# Patient Record
Sex: Female | Born: 1985 | Race: White | Hispanic: No | Marital: Single | Smoking: Never smoker
Health system: Southern US, Community
[De-identification: ages and names within clinical notes are randomized; demographics above are authoritative.]

## PROBLEM LIST (undated history)

## (undated) DIAGNOSIS — F32A Depression, unspecified: Secondary | ICD-10-CM

## (undated) DIAGNOSIS — E282 Polycystic ovarian syndrome: Secondary | ICD-10-CM

## (undated) DIAGNOSIS — F329 Major depressive disorder, single episode, unspecified: Secondary | ICD-10-CM

## (undated) DIAGNOSIS — G43909 Migraine, unspecified, not intractable, without status migrainosus: Secondary | ICD-10-CM

## (undated) DIAGNOSIS — M797 Fibromyalgia: Secondary | ICD-10-CM

## (undated) DIAGNOSIS — D649 Anemia, unspecified: Secondary | ICD-10-CM

## (undated) DIAGNOSIS — T7840XA Allergy, unspecified, initial encounter: Secondary | ICD-10-CM

## (undated) DIAGNOSIS — K5090 Crohn's disease, unspecified, without complications: Secondary | ICD-10-CM

## (undated) DIAGNOSIS — L4050 Arthropathic psoriasis, unspecified: Secondary | ICD-10-CM

## (undated) HISTORY — DX: Depression, unspecified: F32.A

## (undated) HISTORY — DX: Allergy, unspecified, initial encounter: T78.40XA

## (undated) HISTORY — DX: Major depressive disorder, single episode, unspecified: F32.9

## (undated) HISTORY — DX: Anemia, unspecified: D64.9

## (undated) HISTORY — DX: Crohn's disease, unspecified, without complications: K50.90

## (undated) HISTORY — DX: Migraine, unspecified, not intractable, without status migrainosus: G43.909

## (undated) HISTORY — PX: COLONOSCOPY W/ BIOPSIES: SHX1374

## (undated) HISTORY — DX: Polycystic ovarian syndrome: E28.2

---
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
## 2007-12-01 ENCOUNTER — Ambulatory Visit: Payer: Self-pay | Admitting: Family Medicine

## 2009-04-02 ENCOUNTER — Encounter: Admission: RE | Admit: 2009-04-02 | Discharge: 2009-04-02 | Payer: Self-pay | Admitting: Otolaryngology

## 2010-12-27 DIAGNOSIS — E282 Polycystic ovarian syndrome: Secondary | ICD-10-CM

## 2010-12-27 HISTORY — DX: Polycystic ovarian syndrome: E28.2

## 2011-02-04 ENCOUNTER — Other Ambulatory Visit (HOSPITAL_COMMUNITY): Payer: Self-pay | Admitting: Surgery

## 2011-02-09 ENCOUNTER — Ambulatory Visit (HOSPITAL_COMMUNITY)
Admission: RE | Admit: 2011-02-09 | Discharge: 2011-02-09 | Disposition: A | Discharge status: Home / Self Care | Payer: BC Managed Care – PPO | Source: Ambulatory Visit | Attending: Surgery | Admitting: Surgery

## 2011-02-09 DIAGNOSIS — Z01818 Encounter for other preprocedural examination: Secondary | ICD-10-CM | POA: Insufficient documentation

## 2011-02-12 ENCOUNTER — Ambulatory Visit (HOSPITAL_COMMUNITY)
Admission: RE | Admit: 2011-02-12 | Discharge: 2011-02-12 | Disposition: A | Discharge status: Home / Self Care | Payer: BC Managed Care – PPO | Source: Ambulatory Visit | Attending: Surgery | Admitting: Surgery

## 2011-02-12 DIAGNOSIS — Z01818 Encounter for other preprocedural examination: Secondary | ICD-10-CM | POA: Insufficient documentation

## 2011-02-12 DIAGNOSIS — Z6841 Body Mass Index (BMI) 40.0 and over, adult: Secondary | ICD-10-CM | POA: Insufficient documentation

## 2011-02-16 ENCOUNTER — Encounter: Payer: BC Managed Care – PPO | Attending: Surgery | Admitting: *Deleted

## 2011-02-16 DIAGNOSIS — Z713 Dietary counseling and surveillance: Secondary | ICD-10-CM | POA: Insufficient documentation

## 2011-02-16 DIAGNOSIS — Z01818 Encounter for other preprocedural examination: Secondary | ICD-10-CM | POA: Insufficient documentation

## 2011-03-18 ENCOUNTER — Encounter: Payer: BC Managed Care – PPO | Attending: Surgery

## 2011-03-18 DIAGNOSIS — Z01818 Encounter for other preprocedural examination: Secondary | ICD-10-CM | POA: Insufficient documentation

## 2011-03-18 DIAGNOSIS — Z713 Dietary counseling and surveillance: Secondary | ICD-10-CM | POA: Insufficient documentation

## 2011-03-31 ENCOUNTER — Other Ambulatory Visit: Payer: Self-pay | Admitting: Surgery

## 2011-03-31 ENCOUNTER — Encounter (HOSPITAL_COMMUNITY): Payer: BC Managed Care – PPO

## 2011-03-31 LAB — DIFFERENTIAL
Basophils Absolute: 0 10*3/uL (ref 0.0–0.1)
Basophils Relative: 1 % (ref 0–1)
Eosinophils Absolute: 0.2 10*3/uL (ref 0.0–0.7)
Eosinophils Relative: 2 % (ref 0–5)
Lymphocytes Relative: 25 % (ref 12–46)
Lymphs Abs: 2.2 10*3/uL (ref 0.7–4.0)
Monocytes Absolute: 0.7 10*3/uL (ref 0.1–1.0)
Monocytes Relative: 8 % (ref 3–12)
Neutro Abs: 5.6 10*3/uL (ref 1.7–7.7)
Neutrophils Relative %: 65 % (ref 43–77)

## 2011-03-31 LAB — CBC
HCT: 38.4 % (ref 36.0–46.0)
Hemoglobin: 12.5 g/dL (ref 12.0–15.0)
MCH: 27.3 pg (ref 26.0–34.0)
MCHC: 32.6 g/dL (ref 30.0–36.0)
MCV: 83.8 fL (ref 78.0–100.0)
Platelets: 416 10*3/uL — ABNORMAL HIGH (ref 150–400)
RBC: 4.58 MIL/uL (ref 3.87–5.11)
RDW: 13.3 % (ref 11.5–15.5)
WBC: 8.7 10*3/uL (ref 4.0–10.5)

## 2011-03-31 LAB — COMPREHENSIVE METABOLIC PANEL
ALT: 35 U/L (ref 0–35)
AST: 25 U/L (ref 0–37)
Albumin: 3.9 g/dL (ref 3.5–5.2)
Alkaline Phosphatase: 64 U/L (ref 39–117)
BUN: 15 mg/dL (ref 6–23)
CO2: 25 mEq/L (ref 19–32)
Calcium: 9.5 mg/dL (ref 8.4–10.5)
Chloride: 101 mEq/L (ref 96–112)
Creatinine, Ser: 0.68 mg/dL (ref 0.4–1.2)
GFR calc Af Amer: >60 mL/min (ref >60)
GFR calc non Af Amer: >60 mL/min (ref >60)
Glucose, Bld: 83 mg/dL (ref 70–99)
Potassium: 3.8 mEq/L (ref 3.5–5.1)
Sodium: 136 mEq/L (ref 135–145)
Total Bilirubin: 0.6 mg/dL (ref 0.3–1.2)
Total Protein: 8 g/dL (ref 6.0–8.3)

## 2011-03-31 LAB — HCG, SERUM, QUALITATIVE: Preg, Serum: NEGATIVE

## 2011-03-31 LAB — SURGICAL PCR SCREEN
MRSA, PCR: NEGATIVE
Staphylococcus aureus: NEGATIVE

## 2011-04-06 ENCOUNTER — Ambulatory Visit (HOSPITAL_COMMUNITY)
Admission: RE | Admit: 2011-04-06 | Discharge: 2011-04-07 | Disposition: A | Discharge status: Home / Self Care | Payer: BC Managed Care – PPO | Source: Ambulatory Visit | Attending: Surgery | Admitting: Surgery

## 2011-04-06 DIAGNOSIS — Z01812 Encounter for preprocedural laboratory examination: Secondary | ICD-10-CM | POA: Insufficient documentation

## 2011-04-06 DIAGNOSIS — Z6841 Body Mass Index (BMI) 40.0 and over, adult: Secondary | ICD-10-CM | POA: Insufficient documentation

## 2011-04-06 DIAGNOSIS — Z01811 Encounter for preprocedural respiratory examination: Secondary | ICD-10-CM | POA: Insufficient documentation

## 2011-04-06 HISTORY — PX: OTHER SURGICAL HISTORY: SHX169

## 2011-04-07 ENCOUNTER — Inpatient Hospital Stay (HOSPITAL_COMMUNITY): Payer: BC Managed Care – PPO

## 2011-04-07 LAB — DIFFERENTIAL
Basophils Absolute: 0 10*3/uL (ref 0.0–0.1)
Basophils Relative: 0 % (ref 0–1)
Eosinophils Absolute: 0 10*3/uL (ref 0.0–0.7)
Eosinophils Relative: 0 % (ref 0–5)
Lymphocytes Relative: 16 % (ref 12–46)
Lymphs Abs: 1.4 10*3/uL (ref 0.7–4.0)
Monocytes Absolute: 0.8 10*3/uL (ref 0.1–1.0)
Monocytes Relative: 9 % (ref 3–12)
Neutro Abs: 6.6 10*3/uL (ref 1.7–7.7)
Neutrophils Relative %: 75 % (ref 43–77)

## 2011-04-07 LAB — CBC
HCT: 36.3 % (ref 36.0–46.0)
Hemoglobin: 11.8 g/dL — ABNORMAL LOW (ref 12.0–15.0)
MCH: 26.9 pg (ref 26.0–34.0)
MCHC: 32.5 g/dL (ref 30.0–36.0)
MCV: 82.7 fL (ref 78.0–100.0)
Platelets: 389 10*3/uL (ref 150–400)
RBC: 4.39 MIL/uL (ref 3.87–5.11)
RDW: 13.4 % (ref 11.5–15.5)
WBC: 8.8 10*3/uL (ref 4.0–10.5)

## 2011-04-12 NOTE — Op Note (Signed)
  NAMECADEE, AGRO              ACCOUNT NO.:  [id number]  MEDICAL RECORD NO.:  [id number]           PATIENT TYPE:  I  LOCATION:  1537                         FACILITY:  [facility]  PHYSICIAN:  Thornton Park. Daphine Deutscher, MD  DATE OF BIRTH:  10-31-1986  DATE OF PROCEDURE:  04/06/2011 DATE OF DISCHARGE:                              OPERATIVE REPORT   PREOPERATIVE DIAGNOSIS:  Morbid obesity, body mass index of 59 with no comorbidities.  PROCEDURE:  Laparoscopic adjustable gastric banding Allergan APL system.  SURGEON:  Thornton Park. Daphine Deutscher, MD  ASSISTANT:  Lorne Skeens. Hoxworth, M.D.  ANESTHESIA:  General endotracheal.  DESCRIPTION OF PROCEDURE:  Shalaya is a 25 year old white female who knows well the band as both of her parents have had lap band.  She was taken to room #1 and given general anesthesia.  The abdomen was prepped with PCMX and draped sterilely.  Time-out was performed and I entered the abdomen through the left upper quadrant using a 0-degree Optiview 10 mm without difficulty.  The abdomen was insufflated and standard trocar placements were used, although they were more tried to be directed towards the upper abdomen since the area of these trocars were well above her umbilicus.  Her liver was retracted with a Surveyor, mining.  A 15 was placed laterally on the right and I then went ahead and scored an area on the left crus.  I opened the gastrohepatic membrane and found the fat stripe along the right crus.  I passed the band passer without difficulty.  The band was then engaged in the buckle.  I chose an APL system band because of the foregut fat and that was placed and brought around and buckled.  I then snapped in place over the calibration tubing.  She has no history of hiatal hernia and no history of reflux.  Tubing was in place and actually plicated with a tube in place 3 sutures laterally and these came up nicely and got good purchases of proximal little pouch and these  were held in place with a tie knot.  The tubing was removed and everything looked really good.  No bleeding was seen.  The tubing was brought out through the port just above the umbilicus.  We inspect everything and looked good.  The Satira Mccallum was removed and the ports were all injected with Marcaine and closed with 4-0 Vicryl.  The pocket more medially on the right side was expanded to allow for the subcutaneous placement of her port which was for the lateral margin of the transverse incision.  I disconnected the tubing placed and then the wounds were all closed with 4-0 Vicryl, Benzoin and Steri-Strips. The patient tolerated the procedure well, was taken to recovery room in satisfactory condition.     Thornton Park Daphine Deutscher, MD     MBM/MEDQ  D:  04/06/2011  T:  04/06/2011  Job:  [id number]  cc:   Dr. Reuben Likes  Electronically Signed by Luretha Murphy MD on 04/12/2011 09:27:45 AM

## 2011-04-20 ENCOUNTER — Encounter: Payer: BC Managed Care – PPO | Attending: Surgery

## 2011-04-20 DIAGNOSIS — Z01818 Encounter for other preprocedural examination: Secondary | ICD-10-CM | POA: Insufficient documentation

## 2011-04-20 DIAGNOSIS — Z713 Dietary counseling and surveillance: Secondary | ICD-10-CM | POA: Insufficient documentation

## 2011-06-02 ENCOUNTER — Encounter: Payer: BC Managed Care – PPO | Attending: Surgery | Admitting: *Deleted

## 2011-06-02 DIAGNOSIS — Z713 Dietary counseling and surveillance: Secondary | ICD-10-CM | POA: Insufficient documentation

## 2011-06-02 DIAGNOSIS — Z01818 Encounter for other preprocedural examination: Secondary | ICD-10-CM | POA: Insufficient documentation

## 2011-06-25 ENCOUNTER — Ambulatory Visit (INDEPENDENT_AMBULATORY_CARE_PROVIDER_SITE_OTHER): Payer: BC Managed Care – PPO | Admitting: Physician Assistant

## 2011-06-25 ENCOUNTER — Encounter (INDEPENDENT_AMBULATORY_CARE_PROVIDER_SITE_OTHER): Payer: Self-pay

## 2011-06-25 VITALS — BP 134/78 | Ht 66.0 in | Wt 346.2 lb

## 2011-06-25 DIAGNOSIS — G43909 Migraine, unspecified, not intractable, without status migrainosus: Secondary | ICD-10-CM | POA: Insufficient documentation

## 2011-06-25 DIAGNOSIS — Z4651 Encounter for fitting and adjustment of gastric lap band: Secondary | ICD-10-CM

## 2011-06-25 DIAGNOSIS — J349 Unspecified disorder of nose and nasal sinuses: Secondary | ICD-10-CM | POA: Insufficient documentation

## 2011-06-25 DIAGNOSIS — J45909 Unspecified asthma, uncomplicated: Secondary | ICD-10-CM | POA: Insufficient documentation

## 2011-06-25 DIAGNOSIS — Z803 Family history of malignant neoplasm of breast: Secondary | ICD-10-CM | POA: Insufficient documentation

## 2011-06-25 DIAGNOSIS — H6690 Otitis media, unspecified, unspecified ear: Secondary | ICD-10-CM | POA: Insufficient documentation

## 2011-06-25 DIAGNOSIS — Z22322 Carrier or suspected carrier of Methicillin resistant Staphylococcus aureus: Secondary | ICD-10-CM

## 2011-06-25 NOTE — Patient Instructions (Signed)
Take clear liquids for the next 48 hours. Thin protein shakes are ok to start on Saturday evening. Call us if you have persistent vomiting or regurgitation, night cough or reflux symptoms. Return as scheduled or sooner if you notice no changes in hunger/portion sizes.   

## 2011-06-25 NOTE — Progress Notes (Signed)
Subjective:     Patient ID: Kerry Martinez, female   DOB: 09/24/1986, 25 y.o.   MRN: [id number]    BP 134/78  Ht 5\' 6"  (1.676 m)  Wt 346 lb 3.2 oz (157.035 kg)  BMI 55.88 kg/m2    HPI  See scanned, dictated note.  Review of Systems     Objective:   Physical Exam     Assessment:         Plan:

## 2011-07-02 ENCOUNTER — Encounter (INDEPENDENT_AMBULATORY_CARE_PROVIDER_SITE_OTHER): Payer: Self-pay | Admitting: Surgery

## 2011-07-12 ENCOUNTER — Ambulatory Visit: Payer: BC Managed Care – PPO | Admitting: *Deleted

## 2011-07-23 ENCOUNTER — Encounter (INDEPENDENT_AMBULATORY_CARE_PROVIDER_SITE_OTHER): Payer: BC Managed Care – PPO

## 2011-09-03 ENCOUNTER — Ambulatory Visit (INDEPENDENT_AMBULATORY_CARE_PROVIDER_SITE_OTHER): Payer: BC Managed Care – PPO | Admitting: Physician Assistant

## 2011-09-03 ENCOUNTER — Encounter (INDEPENDENT_AMBULATORY_CARE_PROVIDER_SITE_OTHER): Payer: Self-pay

## 2011-09-03 VITALS — BP 142/90 | HR 74 | Ht 66.5 in | Wt 339.2 lb

## 2011-09-03 DIAGNOSIS — Z4651 Encounter for fitting and adjustment of gastric lap band: Secondary | ICD-10-CM

## 2011-09-03 NOTE — Patient Instructions (Signed)
Take clear liquids for the next 48 hours. Thin protein shakes are ok to start on Saturday evening. Call us if you have persistent vomiting or regurgitation, night cough or reflux symptoms. Return as scheduled or sooner if you notice no changes in hunger/portion sizes.   

## 2011-09-03 NOTE — Progress Notes (Signed)
  HISTORY: Kerry Martinez is a 25 y.o.female who received an AP-Large lap-band in April 2012 by Dr. Daphine Deutscher. She's been doing well since her last appointment but has noticed increased hunger and portion sizes in the past couple of weeks. She denies vomiting or regurgitation.  VITAL SIGNS: Filed Vitals:   09/03/11 1130  BP: 142/90  Pulse: 74    PHYSICAL EXAM: Physical exam reveals a very well-appearing 24 y.o.female in no apparent distress Neurologic: Awake, alert, oriented Psych: Bright affect, conversant Respiratory: Breathing even and unlabored. No stridor or wheezing Abdomen: Soft, nontender, nondistended to palpation. Incisions well-healed. No incisional hernias. Port easily palpated. Extremities: Atraumatic, good range of motion.  ASSESMENT: 25 y.o.  female  s/p AP-Large lap-band.   PLAN: The patient's port was accessed with a 20G Huber needle without difficulty. Clear fluid was aspirated and 0.5 mL saline was added to the port. The patient was able to swallow water without difficulty following the procedure and was instructed to take clear liquids for the next 24-48 hours and advance slowly as tolerated.

## 2011-10-01 ENCOUNTER — Encounter (INDEPENDENT_AMBULATORY_CARE_PROVIDER_SITE_OTHER): Payer: BC Managed Care – PPO

## 2012-01-30 ENCOUNTER — Emergency Department (HOSPITAL_COMMUNITY)
Admission: EM | Admit: 2012-01-30 | Discharge: 2012-01-30 | Disposition: A | Discharge status: Home / Self Care | Payer: BC Managed Care – PPO | Attending: Emergency Medicine | Admitting: Emergency Medicine

## 2012-01-30 ENCOUNTER — Encounter (HOSPITAL_COMMUNITY): Payer: Self-pay | Admitting: *Deleted

## 2012-01-30 ENCOUNTER — Emergency Department (HOSPITAL_COMMUNITY): Payer: BC Managed Care – PPO

## 2012-01-30 DIAGNOSIS — K529 Noninfective gastroenteritis and colitis, unspecified: Secondary | ICD-10-CM

## 2012-01-30 DIAGNOSIS — R109 Unspecified abdominal pain: Secondary | ICD-10-CM | POA: Insufficient documentation

## 2012-01-30 DIAGNOSIS — R10819 Abdominal tenderness, unspecified site: Secondary | ICD-10-CM | POA: Insufficient documentation

## 2012-01-30 DIAGNOSIS — K5289 Other specified noninfective gastroenteritis and colitis: Secondary | ICD-10-CM | POA: Insufficient documentation

## 2012-01-30 DIAGNOSIS — Z9884 Bariatric surgery status: Secondary | ICD-10-CM | POA: Insufficient documentation

## 2012-01-30 LAB — DIFFERENTIAL
Basophils Absolute: 0 10*3/uL (ref 0.0–0.1)
Basophils Relative: 0 % (ref 0–1)
Eosinophils Absolute: 0.2 10*3/uL (ref 0.0–0.7)
Eosinophils Relative: 2 % (ref 0–5)
Lymphocytes Relative: 13 % (ref 12–46)
Lymphs Abs: 1.4 10*3/uL (ref 0.7–4.0)
Monocytes Absolute: 0.7 10*3/uL (ref 0.1–1.0)
Monocytes Relative: 7 % (ref 3–12)
Neutro Abs: 8.2 10*3/uL — ABNORMAL HIGH (ref 1.7–7.7)
Neutrophils Relative %: 78 % — ABNORMAL HIGH (ref 43–77)

## 2012-01-30 LAB — CBC
HCT: 34.7 % — ABNORMAL LOW (ref 36.0–46.0)
Hemoglobin: 11.4 g/dL — ABNORMAL LOW (ref 12.0–15.0)
MCH: 26.3 pg (ref 26.0–34.0)
MCHC: 32.9 g/dL (ref 30.0–36.0)
MCV: 80 fL (ref 78.0–100.0)
Platelets: 490 10*3/uL — ABNORMAL HIGH (ref 150–400)
RBC: 4.34 MIL/uL (ref 3.87–5.11)
RDW: 12.8 % (ref 11.5–15.5)
WBC: 10.5 10*3/uL (ref 4.0–10.5)

## 2012-01-30 LAB — COMPREHENSIVE METABOLIC PANEL
ALT: 21 U/L (ref 0–35)
AST: 13 U/L (ref 0–37)
Albumin: 3.5 g/dL (ref 3.5–5.2)
Alkaline Phosphatase: 91 U/L (ref 39–117)
BUN: 13 mg/dL (ref 6–23)
CO2: 27 mEq/L (ref 19–32)
Calcium: 9.8 mg/dL (ref 8.4–10.5)
Chloride: 98 mEq/L (ref 96–112)
Creatinine, Ser: 0.66 mg/dL (ref 0.50–1.10)
GFR calc Af Amer: >90 mL/min (ref >90)
GFR calc non Af Amer: >90 mL/min (ref >90)
Glucose, Bld: 90 mg/dL (ref 70–99)
Potassium: 4.2 mEq/L (ref 3.5–5.1)
Sodium: 134 mEq/L — ABNORMAL LOW (ref 135–145)
Total Bilirubin: 0.2 mg/dL — ABNORMAL LOW (ref 0.3–1.2)
Total Protein: 8 g/dL (ref 6.0–8.3)

## 2012-01-30 LAB — LIPASE, BLOOD: Lipase: 18 U/L (ref 11–59)

## 2012-01-30 MED ORDER — HYDROMORPHONE HCL PF 1 MG/ML IJ SOLN
1.0000 mg | Freq: Once | INTRAMUSCULAR | Status: AC
  Administered 2012-01-30: 1 mg via INTRAVENOUS
  Filled 2012-01-30: qty 1

## 2012-01-30 MED ORDER — OXYCODONE-ACETAMINOPHEN 5-325 MG PO TABS: 1.0000 | ORAL_TABLET | Freq: Three times a day (TID) | ORAL | Status: AC | PRN

## 2012-01-30 MED ORDER — ONDANSETRON HCL 4 MG/2ML IJ SOLN
4.0000 mg | Freq: Once | INTRAMUSCULAR | Status: AC
  Administered 2012-01-30: 4 mg via INTRAVENOUS
  Filled 2012-01-30: qty 2

## 2012-01-30 MED ORDER — SODIUM CHLORIDE 0.9 % IV BOLUS (SEPSIS)
1000.0000 mL | Freq: Once | INTRAVENOUS | Status: AC
  Administered 2012-01-30: 1000 mL via INTRAVENOUS

## 2012-01-30 MED ORDER — GI COCKTAIL ~~LOC~~
30.0000 mL | Freq: Once | ORAL | Status: AC
  Administered 2012-01-30: 30 mL via ORAL
  Filled 2012-01-30: qty 30

## 2012-01-30 MED ORDER — ONDANSETRON HCL 4 MG PO TABS: 4.0000 mg | ORAL_TABLET | Freq: Three times a day (TID) | ORAL | Status: AC | PRN

## 2012-01-30 MED ORDER — IOHEXOL 300 MG/ML  SOLN
100.0000 mL | Freq: Once | INTRAMUSCULAR | Status: AC | PRN
  Administered 2012-01-30: 100 mL via INTRAVENOUS

## 2012-01-30 NOTE — ED Notes (Signed)
Pt from home c/o mid abdominal pain for about a week that is getting worse. Pt endorses nausea and abdominal bloating but denies vomiting and fever. Pt with hx of lap band procedure done in 03/2011.

## 2012-01-30 NOTE — ED Notes (Signed)
Patient is alert and oriented x3.  She was given DC instructions and follow up visit instructions.  Patient gave verbal understanding. She was DC ambulatory under his own power to home.  V/S stable.  He was not showing any signs of distress on DC 

## 2012-01-31 NOTE — ED Provider Notes (Signed)
History     CSN: [id number]  Arrival date & time 01/30/12  1057   First MD Initiated Contact with Patient 01/30/12 1158      Chief Complaint  Patient presents with  . Abdominal Pain  . Nausea    HPI This patient with a history of lap band procedure now presents with one week of abdominal pain.  Her pain is sharp, sore, focally about her superior abdomen, with minimal radiation diffusely.  She also notes nausea, anorexia, changes in bowel movements.  She denies fever, chills.  No clear alleviating or exacerbating factors.  No clear correlation with by mouth intake Past Medical History  Diagnosis Date  . Asthma     Past Surgical History  Procedure Date  . Tonsillectomy 1997  . Tympanostomy tube placement 1990  . Mastoidectomy 1991  . Ear graph 2004  . Lap band procedure     Family History  Problem Relation Age of Onset  . Diabetes Father   . Hypertension Father   . Hyperlipidemia Father   . Other Sister     fibromyalgia    History  Substance Use Topics  . Smoking status: Never Smoker   . Smokeless tobacco: Never Used  . Alcohol Use: Yes     socially    OB History    Grav Para Term Preterm Abortions TAB SAB Ect Mult Living                  Review of Systems  Constitutional:       HPI  HENT:       HPI otherwise negative  Eyes: Negative.   Respiratory:       HPI, otherwise negative  Cardiovascular:       HPI, otherwise nmegative  Gastrointestinal: Negative for vomiting.  Genitourinary:       HPI, otherwise negative  Musculoskeletal:       HPI, otherwise negative  Skin: Negative.   Neurological: Negative for syncope.    Allergies  Amoxil; Cefzil; and Cephalosporins  Home Medications   Current Outpatient Rx  Name Route Sig Dispense Refill  . ALBUTEROL SULFATE HFA 108 (90 BASE) MCG/ACT IN AERS Inhalation Inhale 2 puffs into the lungs every 4 (four) hours as needed. For shortness of breath.    . BUDESONIDE 180 MCG/ACT IN AEPB Inhalation Inhale 2  puffs into the lungs 2 (two) times daily as needed. For shortness of breath.    Marland Kitchen CALCIUM-VITAMIN D 500-200 MG-UNIT PO TABS Oral Take 1 tablet by mouth 3 (three) times daily.    Marland Kitchen VITAMIN D 1000 UNITS PO TABS Oral Take 1,000 Units by mouth daily.    Marland Kitchen FERROUS SULFATE 325 (65 FE) MG PO TABS Oral Take 325 mg by mouth daily with breakfast.    . THERA M PLUS PO TABS Oral Take 1 tablet by mouth daily.    Marland Kitchen ONDANSETRON HCL 4 MG PO TABS Oral Take 1 tablet (4 mg total) by mouth every 8 (eight) hours as needed for nausea. 12 tablet 0  . OXYCODONE-ACETAMINOPHEN 5-325 MG PO TABS Oral Take 1 tablet by mouth every 8 (eight) hours as needed for pain. 15 tablet 0    BP 115/82  Pulse 90  Temp(Src) 98.6 F (37 C) (Oral)  Resp 18  Ht 5' 6.5" (1.689 m)  Wt 317 lb (143.79 kg)  BMI 50.40 kg/m2  SpO2 98%  LMP 11/25/2011  Physical Exam  Nursing note and vitals reviewed. Constitutional: She is oriented to person,  place, and time. She appears well-developed and well-nourished. No distress.       Obese young female  HENT:  Head: Normocephalic and atraumatic.  Eyes: Conjunctivae and EOM are normal.  Cardiovascular: Normal rate and regular rhythm.   Pulmonary/Chest: Effort normal and breath sounds normal. No stridor. No respiratory distress.  Abdominal: She exhibits no distension. There is tenderness in the epigastric area and periumbilical area. There is guarding. There is no rigidity, no tenderness at McBurney's point and negative Murphy's sign.    Musculoskeletal: She exhibits no edema.  Neurological: She is alert and oriented to person, place, and time. No cranial nerve deficit.  Skin: Skin is warm and dry.  Psychiatric: She has a normal mood and affect.    ED Course  Procedures (including critical care time)  Labs Reviewed  COMPREHENSIVE METABOLIC PANEL - Abnormal; Notable for the following:    Sodium 134 (*)    Total Bilirubin 0.2 (*)    All other components within normal limits  CBC -  Abnormal; Notable for the following:    Hemoglobin 11.4 (*)    HCT 34.7 (*)    Platelets 490 (*)    All other components within normal limits  DIFFERENTIAL - Abnormal; Notable for the following:    Neutrophils Relative 78 (*)    Neutro Abs 8.2 (*)    All other components within normal limits  LIPASE, BLOOD  LAB REPORT - SCANNED   Ct Abdomen Pelvis W Contrast  01/30/2012  *RADIOLOGY REPORT*  Clinical Data: Progressive abdominal pain and nausea and vomiting. Gastric lap band.  CT ABDOMEN AND PELVIS WITH CONTRAST  Technique:  Multidetector CT imaging of the abdomen and pelvis was performed following the standard protocol during bolus administration of intravenous contrast.  Contrast: OMNIPAQUE IOHEXOL 300 MG/ML IV SOLN  Comparison: None.  Findings: An inflammatory mass is seen involving the right lower quadrant small bowel mesentery and distal small bowel loops including the terminal ileum.  A normal-appearing appendix is seen separate from this location.  There is also shotty right lower quadrant mesenteric lymphadenopathy, which is likely reactive in etiology.  This is suspicious for Crohn's disease.  There is no evidence of bowel obstruction. There is no evidence of abscess or free fluid.  Gastric lap band is seen in appropriate position.  The abdominal parenchymal organs and gallbladder are normal in appearance.  No evidence of hydronephrosis.  Uterus and adnexa are unremarkable. No soft tissue masses or lymphadenopathy identified.  IMPRESSION:  1.  Inflammatory mass in the right lower quadrant involving distal ileum and small bowel mesentery, with mild mesenteric lymphadenopathy which is likely reactive.  The appendix is visualized separately, and this is highly suspicious for Crohn's disease.  Differential diagnosis includes infectious enteritis and lymphoma. 2.  No evidence of abscess, free fluid, or bowel obstruction.  Original Report Authenticated By: Danae Orleans, M.D.    CT reviewed by  me.   1. Enteritis       MDM  This obese young female now presents with abdominal pain.  On exam she is tender.  Given the patient's history of prior intervention, there is concern for obstruction, versus abscess.  The patient's radiograph studies notable for enteritis.  Given the patient's lack of distress, her unremarkable vital signs, she is appropriate for continued evaluation as an outpatient.  Pertinent findings were discussed with the patient and her parents.  The patient was discharged in stable condition to follow up with Dr. Madilyn Fireman, her parents'  gastroenterologist.        Gerhard Munch, MD 01/31/12 [phone]

## 2012-02-25 ENCOUNTER — Telehealth (INDEPENDENT_AMBULATORY_CARE_PROVIDER_SITE_OTHER): Payer: Self-pay | Admitting: General Surgery

## 2012-02-25 NOTE — Telephone Encounter (Signed)
The patient and her mother come to the office today stating she is seeing Dr Joanell Rising at [facility] for possible Crohns disease. The patient is scheduled to have a colonoscopy with him 03/13/12. They would like to come to the office and speak with you regarding her lap band and the possible effects this may have on it. She has experienced tremendous abdominal pain and n/v. States she is not able to follow her normal diet at this time. Scheduled her to come to the clinic 03/02/12 at 10:50, contacted the patients mother to advise her of appt.

## 2012-03-02 ENCOUNTER — Encounter (INDEPENDENT_AMBULATORY_CARE_PROVIDER_SITE_OTHER): Payer: Self-pay | Admitting: Surgery

## 2012-03-02 ENCOUNTER — Ambulatory Visit (INDEPENDENT_AMBULATORY_CARE_PROVIDER_SITE_OTHER): Payer: BC Managed Care – PPO | Admitting: Surgery

## 2012-03-02 VITALS — BP 132/86 | HR 80 | Temp 97.8°F | Resp 12 | Ht 66.5 in | Wt 314.6 lb

## 2012-03-02 DIAGNOSIS — Z9884 Bariatric surgery status: Secondary | ICD-10-CM | POA: Insufficient documentation

## 2012-03-02 NOTE — Progress Notes (Signed)
Kerry Martinez comes in today after her Super Bowl weekend attack of pain leading her to the emergency department where CT scan suggested that she might have Crohn's disease. She is in the distal small bowel inflammatory changes he was having pain when she ate. She is seeing seen again at the time and he's got her on antibiotics and steroids. She scheduled have a colonoscopy on the 18th.  She's about 75 pounds plus down since her surgery. It seems it the area of inflammation which are reviewed the CT is well away from her band tubing but that was normal initial fossa baby is somewhat be related to that. It may be that his Crohn's but she had no diarrhea prior to having this pain so I wonder whether it could be some form of an infectious etiology. I plan to see her back in 6 weeks in followup. Meantime her parents are down in [place] visiting grand baby. All 3 of them are lapband patients.

## 2012-03-14 ENCOUNTER — Other Ambulatory Visit: Payer: Self-pay | Admitting: Gastroenterology

## 2012-03-14 DIAGNOSIS — IMO0002 Reserved for concepts with insufficient information to code with codable children: Secondary | ICD-10-CM

## 2012-03-14 DIAGNOSIS — K5090 Crohn's disease, unspecified, without complications: Secondary | ICD-10-CM

## 2012-03-17 ENCOUNTER — Ambulatory Visit
Admission: RE | Admit: 2012-03-17 | Discharge: 2012-03-17 | Disposition: A | Discharge status: Home / Self Care | Payer: BC Managed Care – PPO | Source: Ambulatory Visit | Attending: Gastroenterology | Admitting: Gastroenterology

## 2012-03-17 DIAGNOSIS — IMO0002 Reserved for concepts with insufficient information to code with codable children: Secondary | ICD-10-CM

## 2012-03-17 DIAGNOSIS — K5090 Crohn's disease, unspecified, without complications: Secondary | ICD-10-CM

## 2012-03-17 MED ORDER — IOHEXOL 300 MG/ML  SOLN
125.0000 mL | Freq: Once | INTRAMUSCULAR | Status: AC | PRN
  Administered 2012-03-17: 125 mL via INTRAVENOUS

## 2012-03-27 ENCOUNTER — Other Ambulatory Visit: Payer: Self-pay | Admitting: Gastroenterology

## 2012-03-27 ENCOUNTER — Ambulatory Visit
Admission: RE | Admit: 2012-03-27 | Discharge: 2012-03-27 | Disposition: A | Discharge status: Home / Self Care | Payer: BC Managed Care – PPO | Source: Ambulatory Visit | Attending: Gastroenterology | Admitting: Gastroenterology

## 2012-03-27 DIAGNOSIS — R7611 Nonspecific reaction to tuberculin skin test without active tuberculosis: Secondary | ICD-10-CM

## 2012-04-11 ENCOUNTER — Encounter (INDEPENDENT_AMBULATORY_CARE_PROVIDER_SITE_OTHER): Payer: Self-pay | Admitting: Surgery

## 2012-04-19 ENCOUNTER — Encounter (INDEPENDENT_AMBULATORY_CARE_PROVIDER_SITE_OTHER): Payer: Self-pay | Admitting: Surgery

## 2012-04-19 ENCOUNTER — Ambulatory Visit (INDEPENDENT_AMBULATORY_CARE_PROVIDER_SITE_OTHER): Payer: BC Managed Care – PPO | Admitting: Surgery

## 2012-04-19 VITALS — BP 128/82 | HR 88 | Temp 97.2°F | Ht 66.5 in | Wt 301.0 lb

## 2012-04-19 DIAGNOSIS — Z9884 Bariatric surgery status: Secondary | ICD-10-CM

## 2012-04-19 DIAGNOSIS — Z4651 Encounter for fitting and adjustment of gastric lap band: Secondary | ICD-10-CM

## 2012-04-19 NOTE — Progress Notes (Signed)
Kerry Martinez Body mass index is 47.85 kg/(m^2).  Having regurgitation:  no  Nocturnal reflux?  no  Amount of fill  0.5  Had positive diagnosis for Crohn's disease by [name].

## 2012-05-02 ENCOUNTER — Emergency Department (HOSPITAL_COMMUNITY)
Admission: EM | Admit: 2012-05-02 | Discharge: 2012-05-02 | Disposition: A | Discharge status: Home / Self Care | Payer: BC Managed Care – PPO | Attending: Emergency Medicine | Admitting: Emergency Medicine

## 2012-05-02 ENCOUNTER — Encounter (HOSPITAL_COMMUNITY): Payer: Self-pay | Admitting: *Deleted

## 2012-05-02 ENCOUNTER — Emergency Department (HOSPITAL_COMMUNITY): Payer: BC Managed Care – PPO

## 2012-05-02 DIAGNOSIS — K5090 Crohn's disease, unspecified, without complications: Secondary | ICD-10-CM | POA: Insufficient documentation

## 2012-05-02 DIAGNOSIS — E669 Obesity, unspecified: Secondary | ICD-10-CM | POA: Insufficient documentation

## 2012-05-02 DIAGNOSIS — R109 Unspecified abdominal pain: Secondary | ICD-10-CM | POA: Insufficient documentation

## 2012-05-02 DIAGNOSIS — J45909 Unspecified asthma, uncomplicated: Secondary | ICD-10-CM | POA: Insufficient documentation

## 2012-05-02 DIAGNOSIS — K921 Melena: Secondary | ICD-10-CM | POA: Insufficient documentation

## 2012-05-02 DIAGNOSIS — Z79899 Other long term (current) drug therapy: Secondary | ICD-10-CM | POA: Insufficient documentation

## 2012-05-02 DIAGNOSIS — R10819 Abdominal tenderness, unspecified site: Secondary | ICD-10-CM | POA: Insufficient documentation

## 2012-05-02 LAB — CBC
HCT: 35.5 % — ABNORMAL LOW (ref 36.0–46.0)
Hemoglobin: 11.7 g/dL — ABNORMAL LOW (ref 12.0–15.0)
MCH: 26.1 pg (ref 26.0–34.0)
MCHC: 33 g/dL (ref 30.0–36.0)
MCV: 79.1 fL (ref 78.0–100.0)
Platelets: 462 10*3/uL — ABNORMAL HIGH (ref 150–400)
RBC: 4.49 MIL/uL (ref 3.87–5.11)
RDW: 15.4 % (ref 11.5–15.5)
WBC: 6 10*3/uL (ref 4.0–10.5)

## 2012-05-02 LAB — URINALYSIS, ROUTINE W REFLEX MICROSCOPIC
Bilirubin Urine: NEGATIVE
Glucose, UA: NEGATIVE mg/dL
Hgb urine dipstick: NEGATIVE
Leukocytes, UA: NEGATIVE
Nitrite: NEGATIVE
Protein, ur: NEGATIVE mg/dL
Specific Gravity, Urine: 1.015 (ref 1.005–1.030)
Urobilinogen, UA: 0.2 mg/dL (ref 0.0–1.0)
pH: 6.5 (ref 5.0–8.0)

## 2012-05-02 LAB — BASIC METABOLIC PANEL
BUN: 10 mg/dL (ref 6–23)
CO2: 24 mEq/L (ref 19–32)
Calcium: 9.5 mg/dL (ref 8.4–10.5)
Chloride: 102 mEq/L (ref 96–112)
Creatinine, Ser: 0.66 mg/dL (ref 0.50–1.10)
GFR calc Af Amer: >90 mL/min (ref >90)
GFR calc non Af Amer: >90 mL/min (ref >90)
Glucose, Bld: 86 mg/dL (ref 70–99)
Potassium: 3.9 mEq/L (ref 3.5–5.1)
Sodium: 138 mEq/L (ref 135–145)

## 2012-05-02 LAB — DIFFERENTIAL
Basophils Absolute: 0.1 10*3/uL (ref 0.0–0.1)
Basophils Relative: 1 % (ref 0–1)
Eosinophils Absolute: 0.1 10*3/uL (ref 0.0–0.7)
Eosinophils Relative: 2 % (ref 0–5)
Lymphocytes Relative: 28 % (ref 12–46)
Lymphs Abs: 1.7 10*3/uL (ref 0.7–4.0)
Monocytes Absolute: 0.5 10*3/uL (ref 0.1–1.0)
Monocytes Relative: 8 % (ref 3–12)
Neutro Abs: 3.6 10*3/uL (ref 1.7–7.7)
Neutrophils Relative %: 61 % (ref 43–77)

## 2012-05-02 LAB — POCT PREGNANCY, URINE: Preg Test, Ur: NEGATIVE

## 2012-05-02 MED ORDER — OXYCODONE-ACETAMINOPHEN 5-325 MG PO TABS: 2.0000 | ORAL_TABLET | ORAL | Status: AC | PRN

## 2012-05-02 MED ORDER — IOHEXOL 300 MG/ML  SOLN: 125.0000 mL | Freq: Once | INTRAMUSCULAR | Status: DC | PRN

## 2012-05-02 MED ORDER — OXYCODONE-ACETAMINOPHEN 5-325 MG PO TABS
2.0000 | ORAL_TABLET | Freq: Once | ORAL | Status: AC
  Administered 2012-05-02: 2 via ORAL
  Filled 2012-05-02: qty 2

## 2012-05-02 MED ORDER — ONDANSETRON HCL 4 MG/2ML IJ SOLN
4.0000 mg | Freq: Once | INTRAMUSCULAR | Status: AC
  Administered 2012-05-02: 4 mg via INTRAVENOUS
  Filled 2012-05-02: qty 2

## 2012-05-02 MED ORDER — SODIUM CHLORIDE 0.9 % IV BOLUS (SEPSIS)
1000.0000 mL | Freq: Once | INTRAVENOUS | Status: AC
  Administered 2012-05-02: 1000 mL via INTRAVENOUS

## 2012-05-02 MED ORDER — HYDROMORPHONE HCL PF 1 MG/ML IJ SOLN
1.0000 mg | Freq: Once | INTRAMUSCULAR | Status: AC
  Administered 2012-05-02: 1 mg via INTRAVENOUS
  Filled 2012-05-02: qty 1

## 2012-05-02 NOTE — Discharge Instructions (Signed)
Close follow up with Dr. Evette Cristal is very important for ongoing management of your crohn's disease. Use percocet as directed for pain but do not drive or operate machinery with percocet use. Return to ER for emergent changing or worsening of symptoms.   Crohn's Disease Crohn's disease is a long-term (chronic) soreness and redness (inflammation) of the intestines (bowel). It can affect any portion of the digestive tract, from the mouth to the anus. It can also cause problems outside the digestive tract. Crohn's disease is closely related to a disease called ulcerative colitis (together, these two diseases are called inflammatory bowel disease).  CAUSES  The cause of Crohn's disease is not known. One Nelva Bush is that, in an easily affected (susceptible) person, the immune system is triggered to attack the body's own digestive tissue. Crohn's disease runs in families. It seems to be more common in certain geographic areas and amongst certain races. There are no clear-cut dietary causes.  SYMPTOMS  Crohn's disease can cause many different symptoms since it can affect many different parts of the body. Symptoms include:  Fatigue.   Weight loss.   Chronic diarrhea, sometime bloody.   Abdominal pain and cramps.   Fever.   Ulcers or canker sores in the mouth or rectum.   Anemia (low red blood cells).   Arthritis, skin problems, and eye problems may occur.  Complications of Crohn's disease can include:  Series of holes (perforation) of the bowel.   Portions of the intestines sticking to each other (adhesions).   Obstruction of the bowel.   Fistula formation, typically in the rectal area but also in other areas. A fistula is an opening between the bowels and the outside, or between the bowels and another organ.   A painful crack in the mucous membrane of the anus (rectal fissure).  DIAGNOSIS  Your caregiver may suspect Crohn's disease based on your symptoms and an exam. Blood tests may confirm  that there is a problem. You may be asked to submit a stool specimen for examination. X-rays and CT scans may be necessary. Ultimately, the diagnosis is usually made after a procedure that uses a flexible tube that is inserted via your mouth or your anus. This is done under sedation and is called either an upper endoscopy or colonoscopy. With these tests, the specialist can take tiny tissue samples and remove them from the inside of the bowel (biopsy). Examination of this biopsy tissue under a microscope can reveal Crohn's disease as the cause of your symptoms. Due to the many different forms that Crohn's disease can take, symptoms may be present for several years before a diagnosis is made. HOME CARE INSTRUCTIONS   There is no cure for Crohn's disease. The best treatment is frequent checkups with your caregiver.   Symptoms such as diarrhea can be controlled with medications. Avoid foods that have a laxative effect such as fresh fruit, vegetables and dairy products. During flare ups, you can rest your bowel by refraining from solid foods. Drink clear liquids frequently during the day (electrolyte or re-hydrating fluids are best. Your caregiver can help you with suggestions). Drink often to prevent loss of body fluids (dehydration). When diarrhea has cleared, eat small meals and more frequently. Avoid food additives and stimulants such as caffeine (coffee, tea, or chocolate). Enzyme supplements may help if you develop intolerance to a sugar in dairy products (lactose). Ask your caregiver or dietitian about specific dietary instructions.   Try to maintain a positive attitude. Learn relaxation techniques such  as self hypnosis, mental imaging, and muscle relaxation.   If possible, avoid stresses which can aggravate your condition.   Exercise regularly.   Follow your diet.   Always get plenty of rest.  SEEK MEDICAL CARE IF:   Your symptoms fail to improve after a week or two of new treatment.   You  experience continued weight loss.   You have ongoing crampy digestion or loose bowels.   You develop a new skin rash, skin sores, or eye problems.  SEEK IMMEDIATE MEDICAL CARE IF:   You have worsening of your symptoms or develop new symptoms.   You have a fever.   You develop bloody diarrhea.   You develop severe abdominal pain.  MAKE SURE YOU:   Understand these instructions.   Will watch your condition.   Will get help right away if you are not doing well or get worse.  Document Released: 09/22/2005 Document Revised: 12/02/2011 Document Reviewed: 08/21/2007 [facility] Patient Information 2012 [place], [place].

## 2012-05-02 NOTE — ED Provider Notes (Signed)
Medical screening examination/treatment/procedure(s) were performed by non-physician practitioner and as supervising physician I was immediately available for consultation/collaboration.  Doug Sou, MD 05/02/12 1719

## 2012-05-02 NOTE — ED Provider Notes (Signed)
History     CSN: [id number]  Arrival date & time 05/02/12  1115   First MD Initiated Contact with Patient 05/02/12 1122      Chief Complaint  Patient presents with  . Abdominal Pain    (Consider location/radiation/quality/duration/timing/severity/associated sxs/prior treatment) HPI  Patient who was dx with Crohn's disease in February and who is followed by Dr. Evette Cristal presents to the ER complaining of  Increasing lower abdominal pain and cramping with loose stools with scant amount of intermittent bright red blood mixed in loose stools. Patient states she called her GI specialist who states that due to increased pain that she needed to present to ER for further evaluation. Patient states she has been on antibiotics over the last 2 months but has completed antibiotic though currently on entocort. She states she has run out of percocet which did give some improvement in pain. She states she is not sexually active and denies fevers, chills, CP, SOB, nausea, vomiting, dysuria, hematuria, vaginal d/c or pelvic pain. She has taken tylenol at home without relief of symptoms.   Past Medical History  Diagnosis Date  . Asthma   . Crohn's disease   . Polycystic ovarian syndrome     Past Surgical History  Procedure Date  . Tonsillectomy 1997  . Tympanostomy tube placement 1990  . Mastoidectomy 1991  . Ear graph 2004  . Lap band procedure 04/06/2011    Family History  Problem Relation Age of Onset  . Diabetes Father   . Hypertension Father   . Hyperlipidemia Father   . Other Sister     fibromyalgia  . Fibromyalgia Sister   . Cancer Mother     precancerous polyps in colon    History  Substance Use Topics  . Smoking status: Never Smoker   . Smokeless tobacco: Never Used  . Alcohol Use: Yes     socially    OB History    Grav Para Term Preterm Abortions TAB SAB Ect Mult Living                  Review of Systems  All other systems reviewed and are negative.    Allergies    Amoxicillin; Cefprozil; and Cephalosporins  Home Medications   Current Outpatient Rx  Name Route Sig Dispense Refill  . ADALIMUMAB 20 MG/0.4ML [place] KIT Subcutaneous Inject 20 mg into the skin every 14 (fourteen) days.    . ALBUTEROL SULFATE HFA 108 (90 BASE) MCG/ACT IN AERS Inhalation Inhale 2 puffs into the lungs every 4 (four) hours as needed. For shortness of breath.    . BUDESONIDE ER 3 MG PO CP24 Oral Take 9 mg by mouth every morning.    . BUDESONIDE 180 MCG/ACT IN AEPB Inhalation Inhale 2 puffs into the lungs 2 (two) times daily as needed. For shortness of breath.    Marland Kitchen CALCIUM-VITAMIN D 500-200 MG-UNIT PO TABS Oral Take 1 tablet by mouth 3 (three) times daily.    Marland Kitchen VITAMIN D 1000 UNITS PO TABS Oral Take 1,000 Units by mouth daily.    Marland Kitchen EMOQUETTE 0.15-30 MG-MCG PO TABS Oral Take 1 tablet by mouth daily.     Carma Leaven M PLUS PO TABS Oral Take 1 tablet by mouth daily.      BP 159/91  Pulse 90  Temp(Src) 97.9 F (36.6 C) (Oral)  Resp 20  SpO2 100%  LMP 04/23/2012  Physical Exam  Nursing note and vitals reviewed. Constitutional: She is oriented to person, place, and  time. She appears well-developed and well-nourished. No distress.       obese  HENT:  Head: Normocephalic and atraumatic.  Eyes: Conjunctivae are normal.  Neck: Normal range of motion. Neck supple.  Cardiovascular: Normal rate, regular rhythm, normal heart sounds and intact distal pulses.  Exam reveals no gallop and no friction rub.   No murmur heard. Pulmonary/Chest: Effort normal and breath sounds normal. No respiratory distress. She has no wheezes. She has no rales. She exhibits no tenderness.  Abdominal: Soft. Bowel sounds are normal. She exhibits no distension and no mass. There is tenderness. There is no rebound and no guarding.       Mild TTP of entire lower abdomen without rigidity or peritoneal signs.   Musculoskeletal: Normal range of motion. She exhibits no edema and no tenderness.  Neurological: She is  alert and oriented to person, place, and time.  Skin: Skin is warm and dry. No rash noted. She is not diaphoretic. No erythema.  Psychiatric: She has a normal mood and affect.    ED Course  Procedures (including critical care time)  IV dilaudid, IV zofran  Labs Reviewed  CBC - Abnormal; Notable for the following:    Hemoglobin 11.7 (*)    HCT 35.5 (*)    Platelets 462 (*)    All other components within normal limits  DIFFERENTIAL  BASIC METABOLIC PANEL  POCT PREGNANCY, URINE  URINALYSIS, ROUTINE W REFLEX MICROSCOPIC   Ct Abdomen Pelvis W Contrast  05/02/2012  *RADIOLOGY REPORT*  Clinical Data: Abdominal pain.  History of Crohn's disease.  CT ABDOMEN AND PELVIS WITH CONTRAST  Technique:  Multidetector CT imaging of the abdomen and pelvis was performed following the standard protocol during bolus administration of intravenous contrast.  Contrast:  100 ml Omnipaque-300.  Comparison: CT scans 01/30/2012 and 03/17/2012.  Findings: The lung bases are clear.  Stable gastric lap band.  No complicating features.  The liver, spleen, pancreas, adrenal glands and kidneys are unremarkable and stable.  The gallbladder is normal.  No common bile duct dilatation.  There is persistent severe inflammation of the distal and terminal ileum and persistent inflammatory interloop "mass". No discrete drainable ulcer is identified.  Overall, the process is slightly improved since February.  No obstruction.  The colon is unremarkable.  There are stable pericecal and mesenteric lymph nodes.  The uterus and ovaries are normal and stable.  No pelvic mass or free pelvic fluid collections.  The bladder is normal.  IMPRESSION:  1.  Persistent marked inflammation of the distal and terminal ileum along with a stable interloop inflammatory mass.  No discrete drainable abscess. 2.  Stable inflamed/hyperplastic pericecal and mesenteric lymph nodes.  Original Report Authenticated By: P. Loralie Champagne, M.D.     1. Crohn's  disease       MDM  Patient is afebrile and non toxic appearing. Non acute abdomen with no acute findings on CT scan, rather improving CT scan. No acute findings on labs. Patient is agreeable to following up with Dr. Evette Cristal for ongoing management of Crohn's.         [place], [place] 05/02/12 1544

## 2012-05-02 NOTE — ED Notes (Signed)
Pt in c/o increased abd pain, pt has been having similar symptoms since feb, increased nausea, pt told to come in by MD due to worry of abscess

## 2012-05-02 NOTE — ED Notes (Signed)
Patient aware of need for urine specimen. Patient unable to void at this time. Patient given label specimen cup. Encouraged to call for assistance if needed.   

## 2012-05-02 NOTE — ED Notes (Signed)
Pt states when she went to bathroom she notice blood when she wiped-states from her rectum

## 2012-06-22 ENCOUNTER — Ambulatory Visit (INDEPENDENT_AMBULATORY_CARE_PROVIDER_SITE_OTHER): Payer: BC Managed Care – PPO | Admitting: Surgery

## 2012-06-22 ENCOUNTER — Encounter (INDEPENDENT_AMBULATORY_CARE_PROVIDER_SITE_OTHER): Payer: Self-pay | Admitting: Surgery

## 2012-06-22 VITALS — BP 100/70 | HR 72 | Temp 96.8°F | Resp 12 | Ht 66.5 in | Wt 288.4 lb

## 2012-06-22 DIAGNOSIS — Z9884 Bariatric surgery status: Secondary | ICD-10-CM

## 2012-06-22 NOTE — Patient Instructions (Addendum)

## 2012-06-22 NOTE — Progress Notes (Signed)
Jeriann N Bodie Body mass index is 45.85 kg/(m^2).  Having regurgitation:  no  Nocturnal reflux?  no  Amount of fill  0.5  Arionne has lost 12 lbs since April.  She has some restriction but wants a bit more.  Some of her weight loss may be secondary to Crohn's treatment.   Filled with 0.5 cc today.  Return 2 months.

## 2012-06-27 ENCOUNTER — Telehealth (INDEPENDENT_AMBULATORY_CARE_PROVIDER_SITE_OTHER): Payer: Self-pay

## 2012-06-27 NOTE — Telephone Encounter (Signed)
Kerry Martinez at [facility] Dr Shawnie Dapper called stating pt needs appt soon with Dr Daphine Deutscher for eval Chrons disease. Pt has seen Dr Daphine Deutscher for lap band surgery in past. Requests sooner appt than I can locate. Please call Kerry Martinez [phone]. She will send up records as soon as dictation complete.

## 2012-06-30 ENCOUNTER — Encounter (INDEPENDENT_AMBULATORY_CARE_PROVIDER_SITE_OTHER): Payer: Self-pay | Admitting: Surgery

## 2012-06-30 ENCOUNTER — Ambulatory Visit (INDEPENDENT_AMBULATORY_CARE_PROVIDER_SITE_OTHER): Payer: BC Managed Care – PPO | Admitting: Surgery

## 2012-06-30 VITALS — BP 142/70 | HR 78 | Temp 97.4°F | Ht 66.5 in | Wt 283.4 lb

## 2012-06-30 DIAGNOSIS — K5090 Crohn's disease, unspecified, without complications: Secondary | ICD-10-CM

## 2012-06-30 NOTE — Progress Notes (Signed)
Kerry Martinez 25 y.o.  Body mass index is 45.06 kg/(m^2).  Patient Active Problem List  Diagnosis  . MRSA (methicillin resistant staph aureus) culture positive  . Asthma  . Migraines  . Ear infection  . Sinus problem/runny nose  . Family history of breast cancer  . 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 Amoxicillin Hives  . Cefprozil Hives  . Cephalosporins Hives and Itching    Past Surgical History  Procedure Date  . Tonsillectomy 1997  . Tympanostomy tube placement 1990  . Mastoidectomy 1991  . Ear graph 2004  . Lap band procedure 04/06/2011   SMITH,KRISTI, MD 1. Crohn's disease     Augustine Brannick comes back with her mother and her some reports from Dr. Evette Cristal indicating he want her to go for surgery. Her diagnosis was made in April and are reviewed his CT scan and may that showed pretty extensive inflammatory changes in her terminal ileum. She has not responded to high dose steroids and he marrow. Before embarking on a laparotomy ago want to repeat her CT scan. If she has an abscess it can be drained that needs to be taken care of. I need to do everything possible to minimize the chances of having to do an ileostomy on her since her abdominal wall was so thick that that would certainly jeopardize the potential success.  In the meantime I will discuss this with Dr. Evette Cristal. I will go ahead and order a  CT abdomen and pelvis and followup subsequently. Matt B. Daphine Deutscher, MD, [facility] [phone] beeper [phone]  06/30/2012 3:11 PM

## 2012-07-05 ENCOUNTER — Encounter (INDEPENDENT_AMBULATORY_CARE_PROVIDER_SITE_OTHER): Payer: Self-pay

## 2012-07-06 ENCOUNTER — Ambulatory Visit
Admission: RE | Admit: 2012-07-06 | Discharge: 2012-07-06 | Disposition: A | Discharge status: Home / Self Care | Payer: BC Managed Care – PPO | Source: Ambulatory Visit | Attending: Surgery | Admitting: Surgery

## 2012-07-06 DIAGNOSIS — K5090 Crohn's disease, unspecified, without complications: Secondary | ICD-10-CM

## 2012-07-06 MED ORDER — IOHEXOL 300 MG/ML  SOLN
125.0000 mL | Freq: Once | INTRAMUSCULAR | Status: AC | PRN
  Administered 2012-07-06: 125 mL via INTRAVENOUS

## 2012-07-10 ENCOUNTER — Telehealth (INDEPENDENT_AMBULATORY_CARE_PROVIDER_SITE_OTHER): Payer: Self-pay | Admitting: General Surgery

## 2012-07-10 ENCOUNTER — Other Ambulatory Visit: Payer: BC Managed Care – PPO

## 2012-07-10 NOTE — Telephone Encounter (Signed)
Patient's mother called stated after her CT on Friday patient has had a bad crohn's flair up. She has been doubling over in pain all weekend. They called Dr Luan Moore office who gave her hydrocodone and zofran which helped a little. Pain is at a 7 1/2 - 8 out of 10 now. She did have some vomiting. Dr Evette Cristal told them to call us for next steps. Please advise. Mother- [name] [phone].

## 2012-07-11 NOTE — Telephone Encounter (Signed)
Spoke with the patients mother and advised her that the CT reveals improvement, mother states that Kerry Martinez is still vomiting and having many episodes of diarrhea. They have decided to tapper down on her steroids and continue with her Humira. They would like to know what the next step is?

## 2012-08-07 DIAGNOSIS — E282 Polycystic ovarian syndrome: Secondary | ICD-10-CM | POA: Insufficient documentation

## 2012-08-07 DIAGNOSIS — E668 Other obesity: Secondary | ICD-10-CM | POA: Insufficient documentation

## 2012-08-07 DIAGNOSIS — K5000 Crohn's disease of small intestine without complications: Secondary | ICD-10-CM | POA: Insufficient documentation

## 2012-08-23 ENCOUNTER — Encounter (INDEPENDENT_AMBULATORY_CARE_PROVIDER_SITE_OTHER): Payer: BC Managed Care – PPO | Admitting: Surgery

## 2012-09-20 ENCOUNTER — Ambulatory Visit (INDEPENDENT_AMBULATORY_CARE_PROVIDER_SITE_OTHER): Payer: BC Managed Care – PPO | Admitting: Family Medicine

## 2012-09-20 VITALS — BP 126/78 | HR 87 | Temp 98.3°F | Resp 17 | Ht 66.0 in | Wt 276.0 lb

## 2012-09-20 DIAGNOSIS — K5090 Crohn's disease, unspecified, without complications: Secondary | ICD-10-CM | POA: Insufficient documentation

## 2012-09-20 DIAGNOSIS — J209 Acute bronchitis, unspecified: Secondary | ICD-10-CM

## 2012-09-20 DIAGNOSIS — J45909 Unspecified asthma, uncomplicated: Secondary | ICD-10-CM

## 2012-09-20 MED ORDER — AZITHROMYCIN 250 MG PO TABS: ORAL_TABLET | ORAL | Status: DC

## 2012-09-20 MED ORDER — GUAIFENESIN-CODEINE 100-10 MG/5ML PO SYRP: 5.0000 mL | ORAL_SOLUTION | Freq: Three times a day (TID) | ORAL | Status: DC | PRN

## 2012-09-20 MED ORDER — BUDESONIDE 180 MCG/ACT IN AEPB: 2.0000 | INHALATION_SPRAY | Freq: Two times a day (BID) | RESPIRATORY_TRACT | Status: DC

## 2012-09-20 MED ORDER — ALBUTEROL SULFATE HFA 108 (90 BASE) MCG/ACT IN AERS: 2.0000 | INHALATION_SPRAY | RESPIRATORY_TRACT | Status: DC | PRN

## 2012-09-20 NOTE — Patient Instructions (Addendum)

## 2012-09-20 NOTE — Progress Notes (Signed)
  Subjective:    Patient ID: Kerry Martinez, female    DOB: 12/04/1986, 26 y.o.   MRN: [id number]  HPI  1 wk prev pt developed sore throat then the next day congestion which resolved several days later but then evolved into productive cough and has not resolved.  Can't sleep due to cough and coughing so hard it will cause regurgitation. Has had low grade fever. 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  Tried Mucinex sinus which did help some.  Using albuterol once a day, has been hearing herself wheeze some which is new for her.  Has been using some antibiotic ear gtts.   Has been on prednisolone for over a month for her Crohn's disease and has almost completed course.  Pt had lap band surgery in April 2012 so hasn't been to eat recently with her acute illness causing inflammation and has been regurgitating/vomiting.     Review of Systems  Constitutional: Positive for fever, activity change, appetite change and fatigue. Negative for chills and diaphoresis.  HENT: Positive for congestion, sore throat, rhinorrhea, trouble swallowing, postnasal drip and sinus pressure. Negative for neck pain and neck stiffness.   Respiratory: Positive for cough and wheezing. Negative for shortness of breath.   Cardiovascular: Negative for chest pain.  Gastrointestinal: Positive for nausea, vomiting, abdominal pain and abdominal distention.  Musculoskeletal: Negative for myalgias and arthralgias.  Skin: Negative for rash.  Psychiatric/Behavioral: Positive for disturbed wake/sleep cycle.       Objective:   Physical Exam  Constitutional: She is oriented to person, place, and time. She appears well-developed and well-nourished. No distress.  HENT:  Head: Normocephalic and atraumatic.  Right Ear: Tympanic membrane, external ear and ear canal normal.  Left Ear: Tympanic membrane, external ear and ear canal normal.  Nose: Rhinorrhea present.  Mouth/Throat: Oropharynx is clear and moist and mucous membranes  are normal. Mucous membranes are not dry. No oropharyngeal exudate.  Eyes: Conjunctivae normal are normal. No scleral icterus.  Neck: Normal range of motion. Neck supple. No thyromegaly present.  Cardiovascular: Normal rate, regular rhythm and normal heart sounds.   Pulmonary/Chest: Effort normal and breath sounds normal. No respiratory distress.       Mild LLL exp wheeze resolved with recurrent deep breathing.  Lymphadenopathy:    She has no cervical adenopathy.  Neurological: She is alert and oriented to person, place, and time.  Skin: Skin is warm and dry. She is not diaphoretic.  Psychiatric: She has a normal mood and affect. Her behavior is normal.          Assessment & Plan:  1.  Bronchitis - as pt is immunosuppressed on chronic steroids due to crohn's disease, will cover w/ azithromycin and symptomatic care with cough syrup. Continue asthma meds.

## 2012-09-21 ENCOUNTER — Telehealth: Payer: Self-pay

## 2012-09-21 MED ORDER — AZITHROMYCIN 200 MG/5ML PO SUSR: ORAL | Status: DC

## 2012-09-21 NOTE — Telephone Encounter (Signed)
I agree, if patient is vomiting she will need to contact her surgeon. We can change her medication to liquid.

## 2012-09-21 NOTE — Telephone Encounter (Signed)
Cough med given was liquid,called mother, since she is on HIPAA  Patient has vomited after taking the Zithromax, she can not keep down pills nor food. States swelling esophagus s/p lab band, does not want to see lap band surgeon until her cough clears, but I am concerned they are related, and she should see surgeon. Please advise if we can send in the Zithromax in liquid form. I have already advised mother Kerry Martinez should see her surgeon about the lab band and vomiting, will advise again when I call back about the meds.

## 2012-09-21 NOTE — Telephone Encounter (Signed)
AMANDA STATES HER DAUGHTER WAS GIVEN SOME MEDICINE FOR HER COUGH AND IT DIDN'T STAY NOW, WOULD LIKE TO KNOW IF THEY CAN GET THAT IN A LIQUID FORM INSTEAD. PLEASE CALL [phone]      [facility]

## 2012-09-22 NOTE — Telephone Encounter (Signed)
Spoke with pt advised message, she thinks she is ok taking the medicine now.

## 2012-09-29 ENCOUNTER — Encounter (INDEPENDENT_AMBULATORY_CARE_PROVIDER_SITE_OTHER): Payer: Self-pay | Admitting: Surgery

## 2012-09-29 ENCOUNTER — Ambulatory Visit (INDEPENDENT_AMBULATORY_CARE_PROVIDER_SITE_OTHER): Payer: BC Managed Care – PPO | Admitting: Surgery

## 2012-09-29 VITALS — BP 128/64 | HR 68 | Temp 97.1°F | Resp 16 | Ht 66.5 in | Wt 273.2 lb

## 2012-09-29 DIAGNOSIS — Z9884 Bariatric surgery status: Secondary | ICD-10-CM

## 2012-09-29 NOTE — Patient Instructions (Signed)
1. Stay on liquids for the next 2 days as you adapt to your new fill volume.  Then resume your previous diet. 2. Decreasing your carbohydrate intake will hasten you weight loss.  Rely more on proteins for your meals.  Avoid condiments that contain sweets such as Honey Mustard and sugary salad dressings.   3. Stay in the "green zone".  If you are regurgitating with meals, having night time reflux, and find yourself eating soft comfort foods (mashed potatoes, potato chips)...realize that you are developing "maladaptive eating".  You will not lose weight this way and may regain weight.  The GREEN ZONE is eating smaller portions and not regurgitating.  Hence we may need to withdraw fluid from your band. 4. Build exercise into your daily routine.  Walking is the best way to start but do something every day if you can.     If you are still too tight, call and let me know [phone]) and we can remove more fluid.

## 2012-09-29 NOTE — Progress Notes (Signed)
Tavi N Preble Body mass index is 43.44 kg/(m^2).  Having regurgitation:  sometimes  Nocturnal reflux?  no  Amount of fill  -0.5 cc  Belenda Cruise has been treated it did for Crohn's. She has taken a large dose of steroids and is tapered off that and is currently getting Humira shots. She has a little more pain after the steroids. I suggested she take some omega-3 fatty acids as well.  Intermittently she has been too tight and has not beenable to eat anything. I went ahead and removed half a cc from her band.  I'lll see her back in 2 months.  She is scheduled to attend [employer] in the next fall.  On track to be a [employer].

## 2012-10-27 ENCOUNTER — Encounter (HOSPITAL_COMMUNITY): Payer: Self-pay | Admitting: Emergency Medicine

## 2012-10-27 ENCOUNTER — Emergency Department (HOSPITAL_COMMUNITY): Payer: BC Managed Care – PPO

## 2012-10-27 ENCOUNTER — Emergency Department (HOSPITAL_COMMUNITY)
Admission: EM | Admit: 2012-10-27 | Discharge: 2012-10-28 | Disposition: A | Discharge status: Home / Self Care | Payer: BC Managed Care – PPO | Attending: Emergency Medicine | Admitting: Emergency Medicine

## 2012-10-27 DIAGNOSIS — K5090 Crohn's disease, unspecified, without complications: Secondary | ICD-10-CM | POA: Insufficient documentation

## 2012-10-27 DIAGNOSIS — IMO0002 Reserved for concepts with insufficient information to code with codable children: Secondary | ICD-10-CM | POA: Insufficient documentation

## 2012-10-27 DIAGNOSIS — K5010 Crohn's disease of large intestine without complications: Secondary | ICD-10-CM

## 2012-10-27 DIAGNOSIS — Z79899 Other long term (current) drug therapy: Secondary | ICD-10-CM | POA: Insufficient documentation

## 2012-10-27 DIAGNOSIS — Z9884 Bariatric surgery status: Secondary | ICD-10-CM | POA: Insufficient documentation

## 2012-10-27 DIAGNOSIS — J45909 Unspecified asthma, uncomplicated: Secondary | ICD-10-CM | POA: Insufficient documentation

## 2012-10-27 DIAGNOSIS — E282 Polycystic ovarian syndrome: Secondary | ICD-10-CM | POA: Insufficient documentation

## 2012-10-27 LAB — COMPREHENSIVE METABOLIC PANEL
ALT: 17 U/L (ref 0–35)
AST: 13 U/L (ref 0–37)
Albumin: 4.1 g/dL (ref 3.5–5.2)
Alkaline Phosphatase: 40 U/L (ref 39–117)
BUN: 10 mg/dL (ref 6–23)
CO2: 22 mEq/L (ref 19–32)
Calcium: 9.8 mg/dL (ref 8.4–10.5)
Chloride: 101 mEq/L (ref 96–112)
Creatinine, Ser: 0.56 mg/dL (ref 0.50–1.10)
GFR calc Af Amer: >90 mL/min (ref >90)
GFR calc non Af Amer: >90 mL/min (ref >90)
Glucose, Bld: 94 mg/dL (ref 70–99)
Potassium: 4.1 mEq/L (ref 3.5–5.1)
Sodium: 136 mEq/L (ref 135–145)
Total Bilirubin: 0.7 mg/dL (ref 0.3–1.2)
Total Protein: 7.7 g/dL (ref 6.0–8.3)

## 2012-10-27 LAB — CBC WITH DIFFERENTIAL/PLATELET
Basophils Absolute: 0 K/uL (ref 0.0–0.1)
Basophils Relative: 0 % (ref 0–1)
Eosinophils Absolute: 0 K/uL (ref 0.0–0.7)
Eosinophils Relative: 0 % (ref 0–5)
HCT: 38.6 % (ref 36.0–46.0)
Hemoglobin: 12.8 g/dL (ref 12.0–15.0)
Lymphocytes Relative: 21 % (ref 12–46)
Lymphs Abs: 2 K/uL (ref 0.7–4.0)
MCH: 27.6 pg (ref 26.0–34.0)
MCHC: 33.2 g/dL (ref 30.0–36.0)
MCV: 83.4 fL (ref 78.0–100.0)
Monocytes Absolute: 0.5 K/uL (ref 0.1–1.0)
Monocytes Relative: 5 % (ref 3–12)
Neutro Abs: 7.1 K/uL (ref 1.7–7.7)
Neutrophils Relative %: 74 % (ref 43–77)
Platelets: 433 K/uL — ABNORMAL HIGH (ref 150–400)
RBC: 4.63 MIL/uL (ref 3.87–5.11)
RDW: 13 % (ref 11.5–15.5)
WBC: 9.6 K/uL (ref 4.0–10.5)

## 2012-10-27 LAB — LIPASE, BLOOD: Lipase: 17 U/L (ref 11–59)

## 2012-10-27 MED ORDER — HYDROMORPHONE HCL PF 1 MG/ML IJ SOLN
1.0000 mg | Freq: Once | INTRAMUSCULAR | Status: AC
  Administered 2012-10-27: 1 mg via INTRAVENOUS
  Filled 2012-10-27: qty 1

## 2012-10-27 MED ORDER — IOHEXOL 300 MG/ML  SOLN
100.0000 mL | Freq: Once | INTRAMUSCULAR | Status: AC | PRN
  Administered 2012-10-27: 100 mL via INTRAVENOUS

## 2012-10-27 MED ORDER — ONDANSETRON HCL 4 MG/2ML IJ SOLN
4.0000 mg | Freq: Once | INTRAMUSCULAR | Status: AC
  Administered 2012-10-27: 4 mg via INTRAVENOUS
  Filled 2012-10-27: qty 2

## 2012-10-27 MED ORDER — SODIUM CHLORIDE 0.9 % IV BOLUS (SEPSIS)
1000.0000 mL | Freq: Once | INTRAVENOUS | Status: AC
  Administered 2012-10-27: 1000 mL via INTRAVENOUS

## 2012-10-27 NOTE — ED Provider Notes (Signed)
History     CSN: [id number]  Arrival date & time 10/27/12  2013   First MD Initiated Contact with Patient 10/27/12 2050      Chief Complaint  Patient presents with  . Abdominal Pain    (Consider location/radiation/quality/duration/timing/severity/associated sxs/prior treatment) HPI Comments: 26 y/o female with history of Crohn's disease presents to the ED with her mom with sudden onset LLQ abdominal pain radiating throughout her abdomen about 24 hours ago. Describes pain as sharp rated 10/10. Admits to associated nausea and vomiting. She has not been able to keep anything down for the past 24 hours and has not tried any alleviating factors due to vomiting. Denies fever, chills, or bowel changes. Diarrhea and bloody stools are normal for her. She was diagnosed with Crohn's this past April. Her Crohn's doctor is Dr. Jack Quarto at [facility]. She currently takes Humira, mercaptopurine and prednisone for her Crohn's.   Patient is a 26 y.o. female presenting with abdominal pain. The history is provided by the patient and a parent.  Abdominal Pain The primary symptoms of the illness include abdominal pain, nausea and vomiting. The primary symptoms of the illness do not include fever or shortness of breath.  Symptoms associated with the illness do not include chills or back pain.    Past Medical History  Diagnosis Date  . Asthma   . Crohn's disease   . Polycystic ovarian syndrome     Past Surgical History  Procedure Date  . Tonsillectomy 1997  . Tympanostomy tube placement 1990  . Mastoidectomy 1991  . Ear graph 2004  . Lap band procedure 04/06/2011    Family History  Problem Relation Age of Onset  . Diabetes Father   . Hypertension Father   . Hyperlipidemia Father   . Other Sister     fibromyalgia  . Fibromyalgia Sister   . Cancer Mother     precancerous polyps in colon    History  Substance Use Topics  . Smoking status: Never Smoker   . Smokeless tobacco: Never Used  .  Alcohol Use: Yes     socially    OB History    Grav Para Term Preterm Abortions TAB SAB Ect Mult Living                  Review of Systems  Constitutional: Positive for appetite change. Negative for fever and chills.  HENT: Negative for neck pain and neck stiffness.   Respiratory: Negative for shortness of breath.   Cardiovascular: Negative for chest pain.  Gastrointestinal: Positive for nausea, vomiting and abdominal pain.  Genitourinary: Negative for difficulty urinating and pelvic pain.  Musculoskeletal: Negative for back pain.  Skin: Negative for pallor.  Neurological: Negative for weakness.  Psychiatric/Behavioral: Negative for confusion.    Allergies  Amoxicillin; Cefprozil; and Cephalosporins  Home Medications   Current Outpatient Rx  Name Route Sig Dispense Refill  . ADALIMUMAB 20 MG/0.4ML [place] KIT Subcutaneous Inject 20 mg into the skin every 14 (fourteen) days.    . ALBUTEROL SULFATE HFA 108 (90 BASE) MCG/ACT IN AERS Inhalation Inhale 2 puffs into the lungs every 4 (four) hours as needed. For shortness of breath. 1 Inhaler 11  . BUDESONIDE 180 MCG/ACT IN AEPB Inhalation Inhale 2 puffs into the lungs 2 (two) times daily. For shortness of breath. 1 Inhaler 5  . CALCIUM-VITAMIN D 500-200 MG-UNIT PO TABS Oral Take 1 tablet by mouth 3 (three) times daily.    Marland Kitchen VITAMIN D 1000 UNITS PO TABS  Oral Take 1,000 Units by mouth daily.    Marland Kitchen EMOQUETTE 0.15-30 MG-MCG PO TABS Oral Take 1 tablet by mouth daily.     . MERCAPTOPURINE 50 MG PO TABS Oral Take 100 mg by mouth daily. Give on an empty stomach 1 hour before or 2 hours after meals. Caution: Chemotherapy.    Carma Leaven M PLUS PO TABS Oral Take 1 tablet by mouth daily.    Marland Kitchen PREDNISONE 10 MG PO TABS Oral Take 5-40 mg by mouth daily. Pt is on taper dose. Take 4 tablets (40 mg total) for a week; then take 3 tablets (30 mg total) for a week; then take 20 mg total for week; then take 10 mg for week and then 5 mg for a week      BP 163/98   Pulse 71  Temp 98 F (36.7 C) (Oral)  Resp 16  SpO2 97%  LMP 09/26/2012  Physical Exam  Nursing note and vitals reviewed. Constitutional: She is oriented to person, place, and time. She appears well-developed. No distress.       Obese  HENT:  Head: Normocephalic and atraumatic.  Mouth/Throat: Oropharynx is clear and moist.  Eyes: Conjunctivae normal and EOM are normal. No scleral icterus.  Neck: Normal range of motion. Neck supple.  Cardiovascular: Normal rate, regular rhythm, normal heart sounds and intact distal pulses.   Pulmonary/Chest: Effort normal and breath sounds normal. No respiratory distress.  Abdominal: Soft. Normal appearance and bowel sounds are normal. She exhibits no distension and no mass. There is tenderness (RLQ>LLQ). There is guarding. There is no rebound.       No peritoneal signs  Musculoskeletal: Normal range of motion. She exhibits no edema.  Lymphadenopathy:    She has no cervical adenopathy.  Neurological: She is alert and oriented to person, place, and time.  Skin: Skin is warm and dry. No rash noted.  Psychiatric: She has a normal mood and affect. Her behavior is normal.    ED Course  Procedures (including critical care time)  Labs Reviewed  CBC WITH DIFFERENTIAL - Abnormal; Notable for the following:    Platelets 433 (*)     All other components within normal limits  COMPREHENSIVE METABOLIC PANEL  LIPASE, BLOOD   Ct Abdomen Pelvis W Contrast  10/28/2012  *RADIOLOGY REPORT*  Clinical Data: Lower abdominal pain. History of Crohn's disease.  CT ABDOMEN AND PELVIS WITH CONTRAST  Technique:  Multidetector CT imaging of the abdomen and pelvis was performed following the standard protocol during bolus administration of intravenous contrast.  Contrast: OMNIPAQUE IOHEXOL 300 MG/ML  SOLN  Comparison: 07/06/2012  Findings: Lung bases are clear.  No effusions.  Heart is normal size.  Changes of prior gastric banding.  Focal low attenuation area in the  left hepatic lobe adjacent to the falciform ligament, likely focal fatty infiltration.  No other focal hepatic abnormality. Gallbladder, spleen, pancreas, adrenals and kidneys are normal.  There is wall thickening and surrounding inflammatory change within the distal small bowel concerning for recurrent Crohn's disease. This is in the area of prior inflammation, slightly worsened since prior study.  Appendix is visualized and is normal.  No evidence of bowel obstruction.  Large bowel grossly unremarkable.  Uterus, adnexa and urinary bladder are unremarkable.  Trace free fluid in the pelvis.  IMPRESSION: Wall thickening and surrounding inflammatory change in the right lower quadrant distal small bowel loops, slightly worsened since prior study concerning for recurrent inflammatory bowel disease. Trace free fluid.  Original Report Authenticated By: Charlett Nose, M.D.      1. Crohn's colitis       MDM  26 y/o female hx of Crohn's with abdominal pain, nausea and vomiting. Labs unremarkable. Pain decreased with dilaudid but still tender on exam. Nausea subsiding with zofran. Consulting GI to discuss case to see whether or not to scan. Patient's last CT scan was July. Spoke with [facility]- he suggested scanning patient if she felt this is a bad flare up. If not, he suggested putting her on clear liquid diet for 3 days along with increasing her prednisone to 40mg  x 3 days, 30 mg x 4 days, 20 mg x 5 days, 10 mg x 6 days. Patient and her mom both feel she needs a CT scan due to new onset LLQ pain. She has had RLQ problems in the past, but never left. Her LLQ is not as tender as her RLQ on exam. Will obtain CT abdomen. 12:34 AM CT abdomen not showing any acute abnormality on left. Right side with colitis. Patient's pain controlled at this time. No longer nauseated. Mild tenderness on exam. Will discharge home with GI's suggestions. She is aware to f/u with her GI doctor at [facility].       Trevor Mace,  PA-C 10/28/12 [phone]

## 2012-10-27 NOTE — ED Notes (Signed)
Pt alert, arrives from home, c/o lower abd pain, onset was today, resp even unlabored, pt c/o nausea for 24 hrs

## 2012-10-28 MED ORDER — OXYCODONE-ACETAMINOPHEN 5-325 MG PO TABS: 1.0000 | ORAL_TABLET | Freq: Four times a day (QID) | ORAL | Status: DC | PRN

## 2012-10-28 MED ORDER — ONDANSETRON HCL 4 MG PO TABS: 4.0000 mg | ORAL_TABLET | Freq: Four times a day (QID) | ORAL | Status: DC

## 2012-10-28 NOTE — ED Provider Notes (Signed)
Medical screening examination/treatment/procedure(s) were performed by non-physician practitioner and as supervising physician I was immediately available for consultation/collaboration.  Eliah Marquard, MD 10/28/12 2332 

## 2012-11-08 ENCOUNTER — Telehealth (INDEPENDENT_AMBULATORY_CARE_PROVIDER_SITE_OTHER): Payer: Self-pay | Admitting: General Surgery

## 2012-11-08 NOTE — Telephone Encounter (Signed)
Pt's mother calling to update Dr. Daphine Deutscher with Kerry Martinez's situation:  She is having her lap band removed today at [facility] by the bariatric team there.  Pt went to the [facility] ED with vomiting (bright red and coffee-grounds); was told it was her Crohns dz and to contact her GI team.  They wound up at [facility] ER; had CT and UGI.  Her lap band has slipped and she has a complete blockage.  [facility] moved her to [facility] for the surgery to remove the lap band.  Kerry Martinez is very upset about this "interruption in her journey" with weight loss.  She was due back to see Dr. Daphine Deutscher in December anyway, but definitely wants appt to discuss her options for continued weight loss.

## 2012-11-10 HISTORY — PX: OTHER SURGICAL HISTORY: SHX169

## 2012-11-12 HISTORY — PX: ESOPHAGOGASTRODUODENOSCOPY: SHX1529

## 2012-11-12 HISTORY — PX: OTHER SURGICAL HISTORY: SHX169

## 2012-11-27 ENCOUNTER — Ambulatory Visit (INDEPENDENT_AMBULATORY_CARE_PROVIDER_SITE_OTHER): Payer: BC Managed Care – PPO | Admitting: Family Medicine

## 2012-11-27 ENCOUNTER — Encounter: Payer: Self-pay | Admitting: Family Medicine

## 2012-11-27 ENCOUNTER — Other Ambulatory Visit: Payer: Self-pay | Admitting: *Deleted

## 2012-11-27 VITALS — BP 130/76 | HR 81 | Temp 99.8°F | Resp 16 | Ht 65.75 in | Wt 266.8 lb

## 2012-11-27 DIAGNOSIS — Z9884 Bariatric surgery status: Secondary | ICD-10-CM

## 2012-11-27 DIAGNOSIS — F32A Depression, unspecified: Secondary | ICD-10-CM

## 2012-11-27 DIAGNOSIS — R112 Nausea with vomiting, unspecified: Secondary | ICD-10-CM

## 2012-11-27 DIAGNOSIS — F329 Major depressive disorder, single episode, unspecified: Secondary | ICD-10-CM

## 2012-11-27 DIAGNOSIS — Z23 Encounter for immunization: Secondary | ICD-10-CM

## 2012-11-27 DIAGNOSIS — R109 Unspecified abdominal pain: Secondary | ICD-10-CM

## 2012-11-27 DIAGNOSIS — K279 Peptic ulcer, site unspecified, unspecified as acute or chronic, without hemorrhage or perforation: Secondary | ICD-10-CM

## 2012-11-27 DIAGNOSIS — K920 Hematemesis: Secondary | ICD-10-CM

## 2012-11-27 DIAGNOSIS — F3289 Other specified depressive episodes: Secondary | ICD-10-CM

## 2012-11-27 DIAGNOSIS — K5090 Crohn's disease, unspecified, without complications: Secondary | ICD-10-CM

## 2012-11-27 LAB — CBC WITH DIFFERENTIAL/PLATELET
Basophils Absolute: 0.1 10*3/uL (ref 0.0–0.1)
Basophils Relative: 1 % (ref 0–1)
Eosinophils Absolute: 0.3 10*3/uL (ref 0.0–0.7)
Eosinophils Relative: 3 % (ref 0–5)
HCT: 35 % — ABNORMAL LOW (ref 36.0–46.0)
Hemoglobin: 11.6 g/dL — ABNORMAL LOW (ref 12.0–15.0)
Lymphocytes Relative: 27 % (ref 12–46)
Lymphs Abs: 2.1 10*3/uL (ref 0.7–4.0)
MCH: 27.3 pg (ref 26.0–34.0)
MCHC: 33.1 g/dL (ref 30.0–36.0)
MCV: 82.4 fL (ref 78.0–100.0)
Monocytes Absolute: 0.6 10*3/uL (ref 0.1–1.0)
Monocytes Relative: 8 % (ref 3–12)
Neutro Abs: 4.7 10*3/uL (ref 1.7–7.7)
Neutrophils Relative %: 61 % (ref 43–77)
Platelets: 409 10*3/uL — ABNORMAL HIGH (ref 150–400)
RBC: 4.25 MIL/uL (ref 3.87–5.11)
RDW: 15 % (ref 11.5–15.5)
WBC: 7.8 10*3/uL (ref 4.0–10.5)

## 2012-11-27 MED ORDER — FLUOXETINE HCL 20 MG PO TABS: 20.0000 mg | ORAL_TABLET | Freq: Every day | ORAL | Status: DC

## 2012-11-27 NOTE — Assessment & Plan Note (Signed)
S/p lap band removal at [facility] in 10/2012.  Considering sleeve placement after Crohn's in remission.   Follow up with Dr. Daphine Deutscher in 12/2012.

## 2012-11-27 NOTE — Assessment & Plan Note (Signed)
New.  S/p admission.  Secondary to Lap Band malfunction and peptic ulcer disease.  S/p Lap Band removal.  Undergoing treatment for ulcers.

## 2012-11-27 NOTE — Progress Notes (Signed)
[address]   [place], [place]  [address]   [phone]  Subjective:    Patient ID: Kerry Martinez, female    DOB: Oct 29, 1986, 26 y.o.   MRN: [id number]  HPIThis 26 y.o. female presents for transition into care.  On October 27, 2012 started getting really sick with nausea and vomiting.  Horrible abdominal pain. Presented to [facility] ED; vomited black emesis; prescribed pain medication and nausea medication.  S/p CT abd/pelvis; diagnosed with Crohn's exacerbation.  Called GI physician/[place] at [facility] the following day; Mercaptopurine.  The following day, nausea and vomiting started again in [place]; +coffee ground emesis again.  Called GI physician again; advised ED evaluation at [facility].  Presented to [facility] ED; admitted overnight; coffee ground emesis in ED.  Ran multiple tests for one week; unable to drink water during one week admission; placed in step down ICU for several days.  Lap Band had slipped; removed Lap Band at [facility]; tranferred to [facility] for Lap Band removal on 11/08/12.  EGD revealed three ulcers gastric and hernia hiatal.  Now taking Protonix, Carafate.  Probably in process for bowel resection for Crohn's disease.  Several GI evaluations during admission; continues to suffer with B lower abdominal pain which is due to Crohn's.  Has failed Humira, 6-MCP and Prednisone for months without improvement.  S/p colorectal surgeon during admission.  GI consult recommended restarting 6-MCP but refused.  Has consult with colorectal surgeon next week.  Dr. Abigail Miyamoto of GI during admission suggested surgery.  Cannot continue to live like this.  Needs to be in remission and be better.  Sees PA regularly at GI clinic.  Waiting to hear from GI physician.  Currently taking Humira weekly and Prednisone daily.  Has daily pain; pain constant 3-4/10 with worsening/exacerbation.  No labs at last visit.  Would like labs due to various issues.  Eating now cautiously.  Trying to maintain healthy  habits.  Food causes worsening abdominal pain.  Constant diarrhea.  Started taking Imodium after last visit with GI; taking one daily.  Nighttime awakening with diarrhea sporadically.  Taking MVI daily; out of calcium and Vitamin D currently; needs to get more.  Emotionally stable; really scared that she will gain weight back.  Bariatric clinic willing to place sleeve after in remission; will not perform gastric bypass due to Crohn's disease.  Has lost 80 pounds since Lap Band placement; very worried that she will gain her weight back.    Review of Systems  Constitutional: Positive for fatigue. Negative for fever, chills and diaphoresis.  Respiratory: Positive for shortness of breath.   Cardiovascular: Negative for chest pain, palpitations and leg swelling.  Gastrointestinal: Positive for nausea, vomiting, abdominal pain and diarrhea. Negative for constipation and blood in stool.  Skin: Negative for rash.  Psychiatric/Behavioral: Positive for dysphoric mood and decreased concentration. Negative for suicidal ideas, sleep disturbance, self-injury and agitation. The patient is nervous/anxious.     Past Medical History  Diagnosis Date  . Asthma   . Crohn's disease   . Polycystic ovarian syndrome   . Migraine   . Allergy     Past Surgical History  Procedure Date  . Tonsillectomy 1997  . Tympanostomy tube placement 1990  . Mastoidectomy 1991  . Ear graph 2004  . Lap band procedure 04/06/2011  . Lap band reversal 11/12/12    [facility]  . Esophagogastroduodenoscopy 11/12/12    3 gastric ulcers, hiatal hernia.  [facility].  . Admission 11/10/12  coffee ground emesis, abdominal pain.  EGD with 3 gastric ulcers and HH.  s/p lab band reversal.  [facility].  GI consults, Bariatric Surgery Consult.    Prior to Admission medications   Medication Sig Start Date End Date Taking? Authorizing Provider  Adalimumab (HUMIRA) 20 MG/0.4ML KIT Inject 20 mg into the skin every 7 (seven)  days.    Yes Historical Provider, MD  albuterol (PROVENTIL HFA;VENTOLIN HFA) 108 (90 BASE) MCG/ACT inhaler Inhale 2 puffs into the lungs every 4 (four) hours as needed. For shortness of breath. 09/20/12  Yes Sherren Mocha, MD  budesonide (PULMICORT) 180 MCG/ACT inhaler Inhale 2 puffs into the lungs 2 (two) times daily. For shortness of breath. 09/20/12  Yes Sherren Mocha, MD  Calcium Carbonate-Vitamin D (CALCIUM-VITAMIN D) 500-200 MG-UNIT per tablet Take 1 tablet by mouth 3 (three) times daily.   Yes Historical Provider, MD  cholecalciferol (VITAMIN D) 1000 UNITS tablet Take 1,000 Units by mouth daily.   Yes Historical Provider, MD  EMOQUETTE 0.15-30 MG-MCG tablet Take 1 tablet by mouth daily.  02/28/12  Yes Historical Provider, MD  misoprostol (CYTOTEC) 200 MCG tablet Take 200 mcg by mouth 4 (four) times daily.   Yes Historical Provider, MD  Multiple Vitamins-Minerals (MULTIVITAMINS THER. W/MINERALS) TABS Take 1 tablet by mouth daily.   Yes Historical Provider, MD  ondansetron (ZOFRAN) 4 MG tablet Take 1 tablet (4 mg total) by mouth every 6 (six) hours. 10/28/12  Yes Trevor Mace, PA-C  oxyCODONE-acetaminophen (PERCOCET) 5-325 MG per tablet Take 1-2 tablets by mouth every 6 (six) hours as needed for pain. 10/28/12  Yes Trevor Mace, PA-C  Pantoprazole Sodium (PROTONIX PO) Take by mouth. Taking 40 mg   Yes Historical Provider, MD  predniSONE (DELTASONE) 10 MG tablet Take 5-40 mg by mouth daily. Pt is on taper dose. Take 4 tablets (40 mg total) for a week; then take 3 tablets (30 mg total) for a week; then take 20 mg total for week; then take 10 mg for week and then 5 mg for a week 10/18/12  Yes Historical Provider, MD  sucralfate (CARAFATE) 1 G tablet Take 1 g by mouth 4 (four) times daily.   Yes Historical Provider, MD  FLUoxetine (PROZAC) 20 MG tablet Take 1 tablet (20 mg total) by mouth daily. 11/27/12   [name], MD  oxyCODONE (OXY IR/ROXICODONE) 5 MG immediate release tablet  11/10/12   Historical  Provider, MD    Allergies  Allergen Reactions  . Amoxicillin Hives  . Cefprozil Hives  . Cephalosporins Hives and Itching    History   Social History  . Marital Status: Single    Spouse Name: N/A    Number of Children: N/A  . Years of Education: N/A   Occupational History  . Not on file.   Social History Main Topics  . Smoking status: Never Smoker   . Smokeless tobacco: Never Used  . Alcohol Use: Yes     Comment: socially  . Drug Use: No  . Sexually Active: Not Currently   Other Topics Concern  . Not on file   Social History Narrative   Marital status: single   Children: none   Lives: with parents.   Employment: works with [employer].    Family History  Problem Relation Age of Onset  . Diabetes Father   . Hypertension Father   . Hyperlipidemia Father   . Other Sister     fibromyalgia  . Fibromyalgia Sister   .  Cancer Mother     precancerous polyps in colon  . Asthma Mother   . Depression Mother   . Diabetes Mother   . Hyperlipidemia Mother   . Hypertension Mother        Objective:   Physical Exam  Nursing note and vitals reviewed. Constitutional: She is oriented to person, place, and time. She appears well-developed and well-nourished. No distress.  HENT:  Mouth/Throat: Oropharynx is clear and moist.  Eyes: Conjunctivae normal are normal. Pupils are equal, round, and reactive to light.  Neck: Normal range of motion. Neck supple. No thyromegaly present.  Cardiovascular: Normal rate, regular rhythm and normal heart sounds.  Exam reveals no gallop and no friction rub.   No murmur heard. Pulmonary/Chest: Effort normal and breath sounds normal.  Lymphadenopathy:    She has no cervical adenopathy.  Neurological: She is alert and oriented to person, place, and time. No cranial nerve deficit. She exhibits normal muscle tone. Coordination normal.  Skin: Skin is warm and dry. No rash noted. She is not diaphoretic.  Psychiatric: She has a normal mood and  affect. Her behavior is normal. Judgment and thought content normal.       Tearful.     PNEUMOVAX ADMINISTERED.    Assessment & Plan:   1. Crohn's disease  CBC with Differential, Comprehensive metabolic panel, Vitamin D 25 hydroxy, Vitamin B12, Folate, Iron  2. Abdominal pain  CBC with Differential, Comprehensive metabolic panel, Vitamin D 25 hydroxy, Vitamin B12, Folate, Iron  3. Peptic ulcer disease  CBC with Differential, Comprehensive metabolic panel, Vitamin D 25 hydroxy, Vitamin B12, Folate, Iron  4. Coffee ground emesis  CBC with Differential, Comprehensive metabolic panel, Vitamin D 25 hydroxy, Vitamin B12, Folate, Iron  5. Depression  Ambulatory referral to Psychology, CBC with Differential, Comprehensive metabolic panel, Vitamin D 25 hydroxy, Vitamin B12, Folate, Iron, DISCONTINUED: FLUoxetine (PROZAC) 20 MG tablet  6. Nausea and vomiting  CBC with Differential, Comprehensive metabolic panel, Vitamin D 25 hydroxy, Vitamin B12, Folate, Iron  7. Need for prophylactic vaccination against Streptococcus pneumoniae (pneumococcus)  Pneumococcal polysaccharide vaccine 23-valent greater than or equal to 2yo subcutaneous/IM

## 2012-11-27 NOTE — Assessment & Plan Note (Signed)
New.  S/p admission due to coffee ground emesis.  Clinically improved on current regimen. Followed closely by [facility] GI.

## 2012-11-27 NOTE — Assessment & Plan Note (Signed)
New to this provider.  Secondary to Crohn's disease and uncontrolled at this time.

## 2012-11-27 NOTE — Assessment & Plan Note (Signed)
Uncontrolled.  Suffers with daily abdominal pain and frequent diarrhea.  Scheduled this week with colorectal surgery at [facility] to consider colon resection; has failed trials of multiple immunosuppresive medications.  Obtain labs due to poor po intake and frequent diarrhea.  S/p Pneumovax in office due to immunosuppressive medications.

## 2012-11-27 NOTE — Assessment & Plan Note (Signed)
Administered in office due to current immunosuppressive medications.

## 2012-11-27 NOTE — Assessment & Plan Note (Signed)
New.  Treat with Prozac therapy; refer for intensive psychotherapy.

## 2012-11-28 LAB — COMPREHENSIVE METABOLIC PANEL
ALT: 32 U/L (ref 0–35)
AST: 11 U/L (ref 0–37)
Albumin: 4.1 g/dL (ref 3.5–5.2)
Alkaline Phosphatase: 34 U/L — ABNORMAL LOW (ref 39–117)
BUN: 14 mg/dL (ref 6–23)
CO2: 26 mEq/L (ref 19–32)
Calcium: 9.7 mg/dL (ref 8.4–10.5)
Chloride: 105 mEq/L (ref 96–112)
Creat: 0.65 mg/dL (ref 0.50–1.10)
Glucose, Bld: 85 mg/dL (ref 70–99)
Potassium: 4 mEq/L (ref 3.5–5.3)
Sodium: 141 mEq/L (ref 135–145)
Total Bilirubin: 0.4 mg/dL (ref 0.3–1.2)
Total Protein: 6.6 g/dL (ref 6.0–8.3)

## 2012-11-28 LAB — VITAMIN D 25 HYDROXY (VIT D DEFICIENCY, FRACTURES): Vit D, 25-Hydroxy: 28 ng/mL — ABNORMAL LOW (ref 30–89)

## 2012-11-28 LAB — VITAMIN B12: Vitamin B-12: 273 pg/mL (ref 211–911)

## 2012-11-28 LAB — FOLATE: Folate: 5.5 ng/mL

## 2012-11-28 LAB — IRON: Iron: 77 ug/dL (ref 42–145)

## 2012-12-13 ENCOUNTER — Ambulatory Visit: Payer: BC Managed Care – PPO | Admitting: Family Medicine

## 2012-12-26 HISTORY — PX: PARTIAL COLECTOMY: SHX5273

## 2013-01-05 ENCOUNTER — Ambulatory Visit (INDEPENDENT_AMBULATORY_CARE_PROVIDER_SITE_OTHER): Payer: BC Managed Care – PPO | Admitting: Surgery

## 2013-01-29 ENCOUNTER — Encounter: Payer: Self-pay | Admitting: Family Medicine

## 2013-01-29 ENCOUNTER — Ambulatory Visit (INDEPENDENT_AMBULATORY_CARE_PROVIDER_SITE_OTHER): Payer: BC Managed Care – PPO | Admitting: Family Medicine

## 2013-01-29 ENCOUNTER — Other Ambulatory Visit: Payer: Self-pay | Admitting: Family Medicine

## 2013-01-29 VITALS — BP 127/75 | HR 81 | Temp 98.5°F | Resp 16 | Ht 66.5 in | Wt 285.0 lb

## 2013-01-29 DIAGNOSIS — F3289 Other specified depressive episodes: Secondary | ICD-10-CM

## 2013-01-29 DIAGNOSIS — E559 Vitamin D deficiency, unspecified: Secondary | ICD-10-CM | POA: Insufficient documentation

## 2013-01-29 DIAGNOSIS — D649 Anemia, unspecified: Secondary | ICD-10-CM | POA: Insufficient documentation

## 2013-01-29 DIAGNOSIS — D689 Coagulation defect, unspecified: Secondary | ICD-10-CM

## 2013-01-29 DIAGNOSIS — F329 Major depressive disorder, single episode, unspecified: Secondary | ICD-10-CM

## 2013-01-29 DIAGNOSIS — K279 Peptic ulcer, site unspecified, unspecified as acute or chronic, without hemorrhage or perforation: Secondary | ICD-10-CM

## 2013-01-29 DIAGNOSIS — K5090 Crohn's disease, unspecified, without complications: Secondary | ICD-10-CM

## 2013-01-29 DIAGNOSIS — F32A Depression, unspecified: Secondary | ICD-10-CM

## 2013-01-29 DIAGNOSIS — E538 Deficiency of other specified B group vitamins: Secondary | ICD-10-CM | POA: Insufficient documentation

## 2013-01-29 LAB — VITAMIN B12: Vitamin B-12: 388 pg/mL (ref 211–911)

## 2013-01-29 LAB — CBC WITH DIFFERENTIAL/PLATELET
Basophils Absolute: 0.1 10*3/uL (ref 0.0–0.1)
Basophils Relative: 1 % (ref 0–1)
Eosinophils Absolute: 0.2 10*3/uL (ref 0.0–0.7)
Eosinophils Relative: 4 % (ref 0–5)
HCT: 30.8 % — ABNORMAL LOW (ref 36.0–46.0)
Hemoglobin: 10.4 g/dL — ABNORMAL LOW (ref 12.0–15.0)
Lymphocytes Relative: 39 % (ref 12–46)
Lymphs Abs: 2.4 10*3/uL (ref 0.7–4.0)
MCH: 27.9 pg (ref 26.0–34.0)
MCHC: 33.8 g/dL (ref 30.0–36.0)
MCV: 82.6 fL (ref 78.0–100.0)
Monocytes Absolute: 0.6 10*3/uL (ref 0.1–1.0)
Monocytes Relative: 9 % (ref 3–12)
Neutro Abs: 2.9 10*3/uL (ref 1.7–7.7)
Neutrophils Relative %: 47 % (ref 43–77)
Platelets: 392 10*3/uL (ref 150–400)
RBC: 3.73 MIL/uL — ABNORMAL LOW (ref 3.87–5.11)
RDW: 14.2 % (ref 11.5–15.5)
WBC: 6.2 10*3/uL (ref 4.0–10.5)

## 2013-01-29 LAB — FOLATE: Folate: >20 ng/mL

## 2013-01-29 LAB — VITAMIN D 25 HYDROXY (VIT D DEFICIENCY, FRACTURES): Vit D, 25-Hydroxy: 30 ng/mL (ref 30–89)

## 2013-01-29 LAB — COMPREHENSIVE METABOLIC PANEL
ALT: 11 U/L (ref 0–35)
AST: 13 U/L (ref 0–37)
Albumin: 3.8 g/dL (ref 3.5–5.2)
Alkaline Phosphatase: 47 U/L (ref 39–117)
BUN: 20 mg/dL (ref 6–23)
CO2: 24 mEq/L (ref 19–32)
Calcium: 9.3 mg/dL (ref 8.4–10.5)
Chloride: 106 mEq/L (ref 96–112)
Creat: 0.61 mg/dL (ref 0.50–1.10)
Glucose, Bld: 79 mg/dL (ref 70–99)
Potassium: 4.2 mEq/L (ref 3.5–5.3)
Sodium: 139 mEq/L (ref 135–145)
Total Bilirubin: 0.3 mg/dL (ref 0.3–1.2)
Total Protein: 6.5 g/dL (ref 6.0–8.3)

## 2013-01-29 MED ORDER — DESOGESTREL-ETHINYL ESTRADIOL 0.15-30 MG-MCG PO TABS: 1.0000 | ORAL_TABLET | Freq: Every day | ORAL | Status: DC

## 2013-01-29 MED ORDER — FLUOXETINE HCL 20 MG PO TABS: 40.0000 mg | ORAL_TABLET | Freq: Every day | ORAL | Status: DC

## 2013-01-29 NOTE — Assessment & Plan Note (Signed)
New.  Tolerating MVI; repeat labs.

## 2013-01-29 NOTE — Assessment & Plan Note (Signed)
Improving; increase Prozac to 40mg  daily; continue every other week psychotherapy; good support system.

## 2013-01-29 NOTE — Assessment & Plan Note (Signed)
New. Obtain labs.  Tolerating MVI.

## 2013-01-29 NOTE — Assessment & Plan Note (Signed)
New; onset after hematemesis and bloody stools; repeat today.  Asymptomatic.

## 2013-01-29 NOTE — Assessment & Plan Note (Signed)
Improving since colon resection; follow-up with general surgery and GI this week. To address bile excess in stomach with both physicians; may warrant biliary evaluation.

## 2013-01-29 NOTE — Progress Notes (Signed)
[address]   [place], [place]  [address]   [phone]  Subjective:    Patient ID: Kerry Martinez, female    DOB: 21-Dec-1986, 27 y.o.   MRN: [id number]  HPIThis 27 y.o. female presents for evaluation of the following:  1.  Depression:  Less teary days; going to therapist and loves it; going every two weeks.  Goes tomorrow; looks forward to it.   No side effects to Prozac.  Two teary days per week.   Able to work; both jobs have been understanding. 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  Working for [employer] in [place].  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  Gave of [employer].    2. Crohn's disease:  S/p colon resection on 12/26/12; admission for two days post-operatively; enhanced recovery protocol; drank protein drink; did epidural.  Lovenox for two weeks ago home; horrible bruising with Lovenox.  Resected several inches of colon.  One incision two inches long.  Slow healing.  Last week with dressing changes, peroxide, neosporin.  Improving.  One small area not healing.  Follow-up this week with surgeon.  No more abdominal pain after one week post-operatively.  Some diarrhea; figuring out foods; really likes vegetables.  Unable to exercise; hopes to be cleared for exercise.   Sees GI next week; has not discussed bile in stomach during EGD with [facility].  Has a lot of gas and bloating with eating but taking all ulcer medications. No nausea, vomiting, coffee ground emesis.  Having diarrhea and more frequent bowel movements; having 2-3 stools per day.  Urgency is troublesome.    3.  Anemia: at last visit; due for repeat labs; no recurrent hematemesis or bloody stools.    4.  Vitamin D deficiency: taking MVI daily; forgets additional Vitamin D supplementation.  5. Vitamin B12 and folate deficiencies: MVI daily.  No n/t/w; no decreased  concentration.  6. Obesity: Dr. Tamala Ser surgeon who removed Lap Band performed EGD; bile in abdomen.  Follow up with [facility] on 02/16/13.     Review of Systems  Constitutional: Negative for fever, chills, diaphoresis and fatigue.  Respiratory: Negative for shortness of breath and wheezing.   Cardiovascular: Negative for chest pain, palpitations and leg swelling.  Gastrointestinal: Positive for diarrhea. Negative for nausea, vomiting, abdominal pain, constipation, blood in stool, abdominal distention and anal bleeding.  Neurological: Negative for dizziness, weakness, light-headedness, numbness and headaches.  Psychiatric/Behavioral: Positive for dysphoric mood. Negative for suicidal ideas, sleep disturbance and self-injury. The patient is nervous/anxious.         Past Medical History  Diagnosis Date  . Asthma   . Crohn's disease   . Polycystic ovarian syndrome   . Migraine   . Allergy     Past Surgical History  Procedure Date  . Tonsillectomy 1997  . Tympanostomy tube placement 1990  . Mastoidectomy 1991  . Ear graph 2004  . Lap band procedure 04/06/2011  . Lap band reversal 11/12/12    [facility]  . Esophagogastroduodenoscopy 11/12/12    3 gastric ulcers, hiatal hernia.  [facility].  . Admission 11/10/12    coffee ground emesis, abdominal pain.  EGD with 3 gastric ulcers and HH.  s/p lab band reversal.  [facility].  GI consults, Bariatric Surgery Consult.    Prior to Admission medications   Medication Sig Start Date End Date  Taking? Authorizing Provider  Adalimumab (HUMIRA) 20 MG/0.4ML KIT Inject 20 mg into the skin every 7 (seven) days.    Yes Historical Provider, MD  albuterol (PROVENTIL HFA;VENTOLIN HFA) 108 (90 BASE) MCG/ACT inhaler Inhale 2 puffs into the lungs every 4 (four) hours as needed. For shortness of breath. 09/20/12  Yes Sherren Mocha, MD  budesonide (PULMICORT) 180 MCG/ACT inhaler Inhale 2 puffs into the lungs 2 (two) times daily. For shortness  of breath. 09/20/12  Yes Sherren Mocha, MD  Calcium Carbonate-Vitamin D (CALCIUM-VITAMIN D) 500-200 MG-UNIT per tablet Take 1 tablet by mouth 3 (three) times daily.   Yes Historical Provider, MD  cholecalciferol (VITAMIN D) 1000 UNITS tablet Take 1,000 Units by mouth daily.   Yes Historical Provider, MD  EMOQUETTE 0.15-30 MG-MCG tablet Take 1 tablet by mouth daily.  02/28/12  Yes Historical Provider, MD  FLUoxetine (PROZAC) 20 MG tablet Take 2 tablets (40 mg total) by mouth daily. 01/29/13  Yes [name], MD  misoprostol (CYTOTEC) 200 MCG tablet Take 200 mcg by mouth 4 (four) times daily.   Yes Historical Provider, MD  Multiple Vitamins-Minerals (MULTIVITAMINS THER. W/MINERALS) TABS Take 1 tablet by mouth daily.   Yes Historical Provider, MD  Pantoprazole Sodium (PROTONIX PO) Take by mouth. Taking 40 mg   Yes Historical Provider, MD  predniSONE (DELTASONE) 10 MG tablet Take 5-40 mg by mouth daily. Pt is on taper dose. Take 4 tablets (40 mg total) for a week; then take 3 tablets (30 mg total) for a week; then take 20 mg total for week; then take 10 mg for week and then 5 mg for a week 10/18/12  Yes Historical Provider, MD  sucralfate (CARAFATE) 1 G tablet Take 1 g by mouth 4 (four) times daily.   Yes Historical Provider, MD  desogestrel-ethinyl estradiol (APRI,EMOQUETTE,SOLIA) 0.15-30 MG-MCG tablet Take 1 tablet by mouth daily. 01/29/13   [name], MD  ondansetron (ZOFRAN) 4 MG tablet Take 1 tablet (4 mg total) by mouth every 6 (six) hours. 10/28/12   Trevor Mace, PA-C  oxyCODONE (OXY IR/ROXICODONE) 5 MG immediate release tablet  11/10/12   Historical Provider, MD  oxyCODONE-acetaminophen (PERCOCET) 5-325 MG per tablet Take 1-2 tablets by mouth every 6 (six) hours as needed for pain. 10/28/12   Trevor Mace, PA-C    Allergies  Allergen Reactions  . Amoxicillin Hives  . Cefprozil Hives  . Cephalosporins Hives and Itching    History   Social History  . Marital Status: Single    Spouse  Name: N/A    Number of Children: N/A  . Years of Education: N/A   Occupational History  . Not on file.   Social History Main Topics  . Smoking status: Never Smoker   . Smokeless tobacco: Never Used  . Alcohol Use: Yes     Comment: socially  . Drug Use: No  . Sexually Active: Not Currently   Other Topics Concern  . Not on file   Social History Narrative   Marital status: single   Children: none   Lives: with parents.   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   Tobacco: none   Alcohol: socially   Drugs :none   Exercise: reguarly    Family History  Problem Relation Age of Onset  . Diabetes Father   . Hypertension Father   . Hyperlipidemia Father   . Other Sister     fibromyalgia  .  Fibromyalgia Sister   . Cancer Mother     precancerous polyps in colon  . Asthma Mother   . Depression Mother   . Diabetes Mother   . Hyperlipidemia Mother   . Hypertension Mother     Objective:   Physical Exam  Nursing note and vitals reviewed. Constitutional: She appears well-developed and well-nourished. No distress.  HENT:  Mouth/Throat: Oropharynx is clear and moist.  Eyes: Conjunctivae normal are normal. Pupils are equal, round, and reactive to light.  Neck: Normal range of motion. Neck supple.  Cardiovascular: Normal rate, regular rhythm and normal heart sounds.   No murmur heard. Pulmonary/Chest: Effort normal and breath sounds normal. She has no wheezes. She has no rales.  Skin: She is not diaphoretic.  Psychiatric: She has a normal mood and affect. Her behavior is normal. Judgment and thought content normal.       TEARFUL.        Assessment & Plan:   1. Anemia, unspecified  CBC with Differential  2. Depression  FLUoxetine (PROZAC) 20 MG tablet  3. Vitamin B12 deficiency  Vitamin B12  4. Folate deficiency  Folate  5. Unspecified vitamin D deficiency  Vitamin D 25 hydroxy, Comprehensive metabolic panel  6. Crohn's disease

## 2013-01-29 NOTE — Assessment & Plan Note (Signed)
New. Tolerating daily MVI; repeat labs.

## 2013-01-29 NOTE — Assessment & Plan Note (Signed)
Improving; no further hematemesis; no melena or bloody stools; repeat CBC today.

## 2013-01-29 NOTE — Patient Instructions (Addendum)
1. Anemia, unspecified  CBC with Differential  2. Vitamin B12 deficiency  Vitamin B12  3. Folate deficiency  Folate  4. Unspecified vitamin D deficiency  Vitamin D 25 hydroxy  5. Depression  FLUoxetine (PROZAC) 20 MG tablet

## 2013-01-31 LAB — IRON AND TIBC
%SAT: 7 % — ABNORMAL LOW (ref 20–55)
Iron: 39 ug/dL — ABNORMAL LOW (ref 42–145)
TIBC: 600 ug/dL — ABNORMAL HIGH (ref 250–470)
UIBC: 561 ug/dL — ABNORMAL HIGH (ref 125–400)

## 2013-01-31 LAB — FERRITIN: Ferritin: 6 ng/mL — ABNORMAL LOW (ref 10–291)

## 2013-03-22 ENCOUNTER — Ambulatory Visit (INDEPENDENT_AMBULATORY_CARE_PROVIDER_SITE_OTHER): Payer: BC Managed Care – PPO | Admitting: Family Medicine

## 2013-03-22 VITALS — BP 150/70 | HR 78 | Temp 97.7°F | Resp 18 | Ht 66.0 in | Wt 301.2 lb

## 2013-03-22 DIAGNOSIS — J40 Bronchitis, not specified as acute or chronic: Secondary | ICD-10-CM

## 2013-03-22 DIAGNOSIS — J329 Chronic sinusitis, unspecified: Secondary | ICD-10-CM

## 2013-03-22 DIAGNOSIS — J45909 Unspecified asthma, uncomplicated: Secondary | ICD-10-CM

## 2013-03-22 MED ORDER — LEVOFLOXACIN 750 MG PO TABS: 750.0000 mg | ORAL_TABLET | Freq: Every day | ORAL | Status: DC

## 2013-03-22 MED ORDER — PREDNISONE 20 MG PO TABS: 40.0000 mg | ORAL_TABLET | Freq: Every day | ORAL | Status: DC

## 2013-03-22 NOTE — Patient Instructions (Signed)
Saline nasal spray atleast 4 times per day, continue albuterol up to every 4 to 6 hours if needed, and pulmicort.  Start the prednisone and antibiotic as discussed, but may need to advise your surgeon that you were prescribed these meds, but only for 5 days. Return to the clinic or go to the nearest emergency room if any of your symptoms worsen or new symptoms occur.  Bronchitis Bronchitis is the body's way of reacting to injury and/or infection (inflammation) of the bronchi. Bronchi are the air tubes that extend from the windpipe into the lungs. If the inflammation becomes severe, it may cause shortness of breath. CAUSES  Inflammation may be caused by:  A virus.  Germs (bacteria).  Dust.  Allergens.  Pollutants and many other irritants. The cells lining the bronchial tree are covered with tiny hairs (cilia). These constantly beat upward, away from the lungs, toward the mouth. This keeps the lungs free of pollutants. When these cells become too irritated and are unable to do their job, mucus begins to develop. This causes the characteristic cough of bronchitis. The cough clears the lungs when the cilia are unable to do their job. Without either of these protective mechanisms, the mucus would settle in the lungs. Then you would develop pneumonia. Smoking is a common cause of bronchitis and can contribute to pneumonia. Stopping this habit is the single most important thing you can do to help yourself. TREATMENT   Your caregiver may prescribe an antibiotic if the cough is caused by bacteria. Also, medicines that open up your airways make it easier to breathe. Your caregiver may also recommend or prescribe an expectorant. It will loosen the mucus to be coughed up. Only take over-the-counter or prescription medicines for pain, discomfort, or fever as directed by your caregiver.  Removing whatever causes the problem (smoking, for example) is critical to preventing the problem from getting  worse.  Cough suppressants may be prescribed for relief of cough symptoms.  Inhaled medicines may be prescribed to help with symptoms now and to help prevent problems from returning.  For those with recurrent (chronic) bronchitis, there may be a need for steroid medicines. SEEK IMMEDIATE MEDICAL CARE IF:   During treatment, you develop more pus-like mucus (purulent sputum).  You have a fever.  Your baby is older than 3 months with a rectal temperature of 102 F (38.9 C) or higher.  Your baby is 2 months old or younger with a rectal temperature of 100.4 F (38 C) or higher.  You become progressively more ill.  You have increased difficulty breathing, wheezing, or shortness of breath. It is necessary to seek immediate medical care if you are elderly or sick from any other disease. MAKE SURE YOU:   Understand these instructions.  Will watch your condition.  Will get help right away if you are not doing well or get worse. Document Released: 12/13/2005 Document Revised: 03/06/2012 Document Reviewed: 10/22/2008 [facility] Patient Information 2013 [place], [place].   Sinusitis Sinusitis is redness, soreness, and swelling (inflammation) of the paranasal sinuses. Paranasal sinuses are air pockets within the bones of your face (beneath the eyes, the middle of the forehead, or above the eyes). In healthy paranasal sinuses, mucus is able to drain out, and air is able to circulate through them by way of your nose. However, when your paranasal sinuses are inflamed, mucus and air can become trapped. This can allow bacteria and other germs to grow and cause infection. Sinusitis can develop quickly and last only  a short time (acute) or continue over a long period (chronic). Sinusitis that lasts for more than 12 weeks is considered chronic.  CAUSES  Causes of sinusitis include:  Allergies.  Structural abnormalities, such as displacement of the cartilage that separates your nostrils (deviated  septum), which can decrease the air flow through your nose and sinuses and affect sinus drainage.  Functional abnormalities, such as when the small hairs (cilia) that line your sinuses and help remove mucus do not work properly or are not present. SYMPTOMS  Symptoms of acute and chronic sinusitis are the same. The primary symptoms are pain and pressure around the affected sinuses. Other symptoms include:  Upper toothache.  Earache.  Headache.  Bad breath.  Decreased sense of smell and taste.  A cough, which worsens when you are lying flat.  Fatigue.  Fever.  Thick drainage from your nose, which often is green and may contain pus (purulent).  Swelling and warmth over the affected sinuses. DIAGNOSIS  Your caregiver will perform a physical exam. During the exam, your caregiver may:  Look in your nose for signs of abnormal growths in your nostrils (nasal polyps).  Tap over the affected sinus to check for signs of infection.  View the inside of your sinuses (endoscopy) with a special imaging device with a light attached (endoscope), which is inserted into your sinuses. If your caregiver suspects that you have chronic sinusitis, one or more of the following tests may be recommended:  Allergy tests.  Nasal culture A sample of mucus is taken from your nose and sent to a lab and screened for bacteria.  Nasal cytology A sample of mucus is taken from your nose and examined by your caregiver to determine if your sinusitis is related to an allergy. TREATMENT  Most cases of acute sinusitis are related to a viral infection and will resolve on their own within 10 days. Sometimes medicines are prescribed to help relieve symptoms (pain medicine, decongestants, nasal steroid sprays, or saline sprays).  However, for sinusitis related to a bacterial infection, your caregiver will prescribe antibiotic medicines. These are medicines that will help kill the bacteria causing the infection.  Rarely,  sinusitis is caused by a fungal infection. In theses cases, your caregiver will prescribe antifungal medicine. For some cases of chronic sinusitis, surgery is needed. Generally, these are cases in which sinusitis recurs more than 3 times per year, despite other treatments. HOME CARE INSTRUCTIONS   Drink plenty of water. Water helps thin the mucus so your sinuses can drain more easily.  Use a humidifier.  Inhale steam 3 to 4 times a day (for example, sit in the bathroom with the shower running).  Apply a warm, moist washcloth to your face 3 to 4 times a day, or as directed by your caregiver.  Use saline nasal sprays to help moisten and clean your sinuses.  Take over-the-counter or prescription medicines for pain, discomfort, or fever only as directed by your caregiver. SEEK IMMEDIATE MEDICAL CARE IF:  You have increasing pain or severe headaches.  You have nausea, vomiting, or drowsiness.  You have swelling around your face.  You have vision problems.  You have a stiff neck.  You have difficulty breathing. MAKE SURE YOU:   Understand these instructions.  Will watch your condition.  Will get help right away if you are not doing well or get worse. Document Released: 12/13/2005 Document Revised: 03/06/2012 Document Reviewed: 12/28/2011 [facility] Patient Information 2013 [place], [place].

## 2013-03-22 NOTE — Progress Notes (Signed)
Subjective:    Patient ID: Kerry Martinez, female    DOB: 1986-04-30, 27 y.o.   MRN: [id number]  HPI ROE KOFFMAN is a 27 y.o. female Hx of Crohn's dz, followed by [facility], currently on Humira. S/p intestinal resection at [facility] in December for Crohn's so in remission, and on Humira for maintenance.  HX of lap band - slipped in November, obstruction - ICU stay, and had removed.  Planning on surgery April 3rd (in one week) for sleeve gastrectomy.   Nasal congestion for past month, now more cough in chest and green nasal discharge, green sputum. Initially worse in sinuses, now more chest congestion - alternates. No fever. Does note face pressure, face pain.  Using albuterol every 4-6 hours past 2-3 days for wheeze and cough, and restarted pulmicort past few days. Has been off prednisone since January.    Tx: advil, decongestants.    Review of Systems  Constitutional: Negative for fever and chills.  HENT: Positive for congestion and sinus pressure.   Respiratory: Positive for cough and wheezing.        Objective:   Physical Exam  Vitals reviewed. Constitutional: She is oriented to person, place, and time. She appears well-developed and well-nourished. No distress.  HENT:  Head: Normocephalic and atraumatic.  Right Ear: Hearing, tympanic membrane, external ear and ear canal normal.  Left Ear: Hearing, tympanic membrane, external ear and ear canal normal.  Nose: Right sinus exhibits maxillary sinus tenderness and frontal sinus tenderness. Left sinus exhibits maxillary sinus tenderness and frontal sinus tenderness (most pressure. ).  Mouth/Throat: Oropharynx is clear and moist. No oropharyngeal exudate.  Eyes: Conjunctivae and EOM are normal. Pupils are equal, round, and reactive to light.  Cardiovascular: Normal rate, regular rhythm, normal heart sounds and intact distal pulses.   No murmur heard. Pulmonary/Chest: Effort normal and breath sounds normal. No respiratory distress.  She has no wheezes. She has no rhonchi.  Neurological: She is alert and oriented to person, place, and time.  Skin: Skin is warm and dry. No rash noted.  Psychiatric: She has a normal mood and affect. Her behavior is normal.      Assessment & Plan:  ALIE MOUDY is a 27 y.o. female Bronchitis - Plan: levofloxacin (LEVAQUIN) 750 MG tablet  Asthma - Plan: predniSONE (DELTASONE) 20 MG tablet  Sinusitis - Plan: levofloxacin (LEVAQUIN) 750 MG tablet  Sinusitis with underlying asthma requiring increased inhaler dosing, and cough - early sinobronchitis.  levaquin 750mg  qd and prednisone for 5 days as impending surgery and she should advise her provider about these medicines. rtc precautions, and tx as below.   Meds ordered this encounter  Medications  . levofloxacin (LEVAQUIN) 750 MG tablet    Sig: Take 1 tablet (750 mg total) by mouth daily.    Dispense:  5 tablet    Refill:  0  . predniSONE (DELTASONE) 20 MG tablet    Sig: Take 2 tablets (40 mg total) by mouth daily.    Dispense:  10 tablet    Refill:  0   Patient Instructions  Saline nasal spray atleast 4 times per day, continue albuterol up to every 4 to 6 hours if needed, and pulmicort.  Start the prednisone and antibiotic as discussed, but may need to advise your surgeon that you were prescribed these meds, but only for 5 days. Return to the clinic or go to the nearest emergency room if any of your symptoms worsen or new symptoms occur.  Bronchitis  Bronchitis is the body's way of reacting to injury and/or infection (inflammation) of the bronchi. Bronchi are the air tubes that extend from the windpipe into the lungs. If the inflammation becomes severe, it may cause shortness of breath. CAUSES  Inflammation may be caused by:  A virus.  Germs (bacteria).  Dust.  Allergens.  Pollutants and many other irritants. The cells lining the bronchial tree are covered with tiny hairs (cilia). These constantly beat upward, away from  the lungs, toward the mouth. This keeps the lungs free of pollutants. When these cells become too irritated and are unable to do their job, mucus begins to develop. This causes the characteristic cough of bronchitis. The cough clears the lungs when the cilia are unable to do their job. Without either of these protective mechanisms, the mucus would settle in the lungs. Then you would develop pneumonia. Smoking is a common cause of bronchitis and can contribute to pneumonia. Stopping this habit is the single most important thing you can do to help yourself. TREATMENT   Your caregiver may prescribe an antibiotic if the cough is caused by bacteria. Also, medicines that open up your airways make it easier to breathe. Your caregiver may also recommend or prescribe an expectorant. It will loosen the mucus to be coughed up. Only take over-the-counter or prescription medicines for pain, discomfort, or fever as directed by your caregiver.  Removing whatever causes the problem (smoking, for example) is critical to preventing the problem from getting worse.  Cough suppressants may be prescribed for relief of cough symptoms.  Inhaled medicines may be prescribed to help with symptoms now and to help prevent problems from returning.  For those with recurrent (chronic) bronchitis, there may be a need for steroid medicines. SEEK IMMEDIATE MEDICAL CARE IF:   During treatment, you develop more pus-like mucus (purulent sputum).  You have a fever.  Your baby is older than 3 months with a rectal temperature of 102 F (38.9 C) or higher.  Your baby is 24 months old or younger with a rectal temperature of 100.4 F (38 C) or higher.  You become progressively more ill.  You have increased difficulty breathing, wheezing, or shortness of breath. It is necessary to seek immediate medical care if you are elderly or sick from any other disease. MAKE SURE YOU:   Understand these instructions.  Will watch your  condition.  Will get help right away if you are not doing well or get worse. Document Released: 12/13/2005 Document Revised: 03/06/2012 Document Reviewed: 10/22/2008 [facility] Patient Information 2013 [place], [place].   Sinusitis Sinusitis is redness, soreness, and swelling (inflammation) of the paranasal sinuses. Paranasal sinuses are air pockets within the bones of your face (beneath the eyes, the middle of the forehead, or above the eyes). In healthy paranasal sinuses, mucus is able to drain out, and air is able to circulate through them by way of your nose. However, when your paranasal sinuses are inflamed, mucus and air can become trapped. This can allow bacteria and other germs to grow and cause infection. Sinusitis can develop quickly and last only a short time (acute) or continue over a long period (chronic). Sinusitis that lasts for more than 12 weeks is considered chronic.  CAUSES  Causes of sinusitis include:  Allergies.  Structural abnormalities, such as displacement of the cartilage that separates your nostrils (deviated septum), which can decrease the air flow through your nose and sinuses and affect sinus drainage.  Functional abnormalities, such as when the small  hairs (cilia) that line your sinuses and help remove mucus do not work properly or are not present. SYMPTOMS  Symptoms of acute and chronic sinusitis are the same. The primary symptoms are pain and pressure around the affected sinuses. Other symptoms include:  Upper toothache.  Earache.  Headache.  Bad breath.  Decreased sense of smell and taste.  A cough, which worsens when you are lying flat.  Fatigue.  Fever.  Thick drainage from your nose, which often is green and may contain pus (purulent).  Swelling and warmth over the affected sinuses. DIAGNOSIS  Your caregiver will perform a physical exam. During the exam, your caregiver may:  Look in your nose for signs of abnormal growths in your nostrils  (nasal polyps).  Tap over the affected sinus to check for signs of infection.  View the inside of your sinuses (endoscopy) with a special imaging device with a light attached (endoscope), which is inserted into your sinuses. If your caregiver suspects that you have chronic sinusitis, one or more of the following tests may be recommended:  Allergy tests.  Nasal culture A sample of mucus is taken from your nose and sent to a lab and screened for bacteria.  Nasal cytology A sample of mucus is taken from your nose and examined by your caregiver to determine if your sinusitis is related to an allergy. TREATMENT  Most cases of acute sinusitis are related to a viral infection and will resolve on their own within 10 days. Sometimes medicines are prescribed to help relieve symptoms (pain medicine, decongestants, nasal steroid sprays, or saline sprays).  However, for sinusitis related to a bacterial infection, your caregiver will prescribe antibiotic medicines. These are medicines that will help kill the bacteria causing the infection.  Rarely, sinusitis is caused by a fungal infection. In theses cases, your caregiver will prescribe antifungal medicine. For some cases of chronic sinusitis, surgery is needed. Generally, these are cases in which sinusitis recurs more than 3 times per year, despite other treatments. HOME CARE INSTRUCTIONS   Drink plenty of water. Water helps thin the mucus so your sinuses can drain more easily.  Use a humidifier.  Inhale steam 3 to 4 times a day (for example, sit in the bathroom with the shower running).  Apply a warm, moist washcloth to your face 3 to 4 times a day, or as directed by your caregiver.  Use saline nasal sprays to help moisten and clean your sinuses.  Take over-the-counter or prescription medicines for pain, discomfort, or fever only as directed by your caregiver. SEEK IMMEDIATE MEDICAL CARE IF:  You have increasing pain or severe headaches.  You  have nausea, vomiting, or drowsiness.  You have swelling around your face.  You have vision problems.  You have a stiff neck.  You have difficulty breathing. MAKE SURE YOU:   Understand these instructions.  Will watch your condition.  Will get help right away if you are not doing well or get worse. Document Released: 12/13/2005 Document Revised: 03/06/2012 Document Reviewed: 12/28/2011 [facility] Patient Information 2013 [place], [place].

## 2013-03-27 HISTORY — PX: LAPAROSCOPIC GASTRIC SLEEVE RESECTION: SHX5895

## 2013-04-02 ENCOUNTER — Ambulatory Visit: Payer: BC Managed Care – PPO | Admitting: Family Medicine

## 2013-04-23 ENCOUNTER — Ambulatory Visit: Payer: BC Managed Care – PPO | Admitting: Family Medicine

## 2013-05-08 ENCOUNTER — Ambulatory Visit (INDEPENDENT_AMBULATORY_CARE_PROVIDER_SITE_OTHER): Payer: BC Managed Care – PPO | Admitting: Family Medicine

## 2013-05-08 ENCOUNTER — Encounter: Payer: Self-pay | Admitting: Family Medicine

## 2013-05-08 VITALS — BP 124/74 | HR 87 | Temp 98.1°F | Resp 16 | Ht 66.0 in | Wt 285.0 lb

## 2013-05-08 DIAGNOSIS — E538 Deficiency of other specified B group vitamins: Secondary | ICD-10-CM

## 2013-05-08 DIAGNOSIS — E559 Vitamin D deficiency, unspecified: Secondary | ICD-10-CM

## 2013-05-08 DIAGNOSIS — F329 Major depressive disorder, single episode, unspecified: Secondary | ICD-10-CM

## 2013-05-08 DIAGNOSIS — K5090 Crohn's disease, unspecified, without complications: Secondary | ICD-10-CM

## 2013-05-08 DIAGNOSIS — F411 Generalized anxiety disorder: Secondary | ICD-10-CM

## 2013-05-08 DIAGNOSIS — F32A Depression, unspecified: Secondary | ICD-10-CM

## 2013-05-08 DIAGNOSIS — D649 Anemia, unspecified: Secondary | ICD-10-CM

## 2013-05-08 LAB — CBC WITH DIFFERENTIAL/PLATELET
Basophils Absolute: 0.1 10*3/uL (ref 0.0–0.1)
Basophils Relative: 2 % — ABNORMAL HIGH (ref 0–1)
Eosinophils Absolute: 0.1 10*3/uL (ref 0.0–0.7)
Eosinophils Relative: 2 % (ref 0–5)
HCT: 33.7 % — ABNORMAL LOW (ref 36.0–46.0)
Hemoglobin: 11.2 g/dL — ABNORMAL LOW (ref 12.0–15.0)
Lymphocytes Relative: 33 % (ref 12–46)
Lymphs Abs: 1.7 10*3/uL (ref 0.7–4.0)
MCH: 26.2 pg (ref 26.0–34.0)
MCHC: 33.2 g/dL (ref 30.0–36.0)
MCV: 78.7 fL (ref 78.0–100.0)
Monocytes Absolute: 0.6 10*3/uL (ref 0.1–1.0)
Monocytes Relative: 11 % (ref 3–12)
Neutro Abs: 2.7 10*3/uL (ref 1.7–7.7)
Neutrophils Relative %: 52 % (ref 43–77)
Platelets: 425 10*3/uL — ABNORMAL HIGH (ref 150–400)
RBC: 4.28 MIL/uL (ref 3.87–5.11)
RDW: 15 % (ref 11.5–15.5)
WBC: 5.2 10*3/uL (ref 4.0–10.5)

## 2013-05-08 LAB — COMPREHENSIVE METABOLIC PANEL
ALT: 23 U/L (ref 0–35)
AST: 26 U/L (ref 0–37)
Albumin: 4.3 g/dL (ref 3.5–5.2)
Alkaline Phosphatase: 66 U/L (ref 39–117)
BUN: 10 mg/dL (ref 6–23)
CO2: 22 mEq/L (ref 19–32)
Calcium: 9.7 mg/dL (ref 8.4–10.5)
Chloride: 105 mEq/L (ref 96–112)
Creat: 0.58 mg/dL (ref 0.50–1.10)
Glucose, Bld: 81 mg/dL (ref 70–99)
Potassium: 4.4 mEq/L (ref 3.5–5.3)
Sodium: 138 mEq/L (ref 135–145)
Total Bilirubin: 0.4 mg/dL (ref 0.3–1.2)
Total Protein: 7.2 g/dL (ref 6.0–8.3)

## 2013-05-08 LAB — IRON: Iron: 56 ug/dL (ref 42–145)

## 2013-05-08 MED ORDER — FLUOXETINE HCL 20 MG PO TABS: 40.0000 mg | ORAL_TABLET | Freq: Every day | ORAL | Status: DC

## 2013-05-08 NOTE — Progress Notes (Signed)
[address]   [place], [place]  [address]   [phone]  Subjective:    Patient ID: Kerry Martinez, female    DOB: [birth year], 27 y.o.   MRN: [id number]  HPI This 27 y.o. female presents for evaluation of the following:  1.  Crohns disease:  Technically in remission; s/p evaluation by GI and general surgery in 02/2013.  Having mild pain again and was having pain at last visit; if no worsening, then not considered a flare or exacerbation.  Continues to have watery stools after each meal.  Dumping with each meal.  Pain not near what it was.  No constant pain.  Having six stools per day.  Same number of stools pre-remission but no pain.    2.  Obesity:  Had vertical sleeve gastrectomy early April by Dr. Artist Pais.  Post-surgery diet was a lot harder this time.  Doing well.        3.  Depression:  Much better; still in therapy once per week; loves therapy.  Tedd Sias at [facility].  Tearful days in a month 5.  Summer plans: working part time at [employer].  Then moving in August to [place].  Leaves in late August.    4.  Anemia:  Worsening after last visit.  Due for repeat labs; denies fatigue.    5.  Irregular menstrual bleeding:  Menses at end of April.  Then two weeks later, started spotting again. Bleeding for 1.5 weeks now.  Taking OCP daily.  Not sure why menses irregular but has been under a lot of stress; did undergo gastric sleeve in April.  No vaginal discharge; no sexually active.   Review of Systems  Constitutional: Negative for chills, diaphoresis, activity change, appetite change and fatigue.  Respiratory: Negative for shortness of breath.   Cardiovascular: Negative for chest pain, palpitations and leg swelling.  Gastrointestinal: Positive for abdominal pain and diarrhea. Negative for nausea, vomiting, constipation, blood in stool, anal bleeding and rectal pain.  Genitourinary: Positive for vaginal bleeding and menstrual problem. Negative for vaginal discharge and  vaginal pain.  Neurological: Negative for dizziness, light-headedness and headaches.  Hematological: Does not bruise/bleed easily.  Psychiatric/Behavioral: Positive for dysphoric mood. Negative for suicidal ideas, sleep disturbance and self-injury. The patient is not nervous/anxious.     Past Medical History  Diagnosis Date  . Asthma   . Crohn's disease   . Polycystic ovarian syndrome   . Migraine   . Allergy     Past Surgical History  Procedure Laterality Date  . Tonsillectomy  1997  . Tympanostomy tube placement  1990  . Mastoidectomy  1991  . Ear graph  2004  . Lap band procedure  04/06/2011  . Lap band reversal  11/12/12    [facility]  . Esophagogastroduodenoscopy  11/12/12    3 gastric ulcers, hiatal hernia.  [facility].  . Admission  11/10/12    coffee ground emesis, abdominal pain.  EGD with 3 gastric ulcers and HH.  s/p lab band reversal.  [facility].  GI consults, Bariatric Surgery Consult.    Prior to Admission medications   Medication Sig Start Date End Date Taking? Authorizing Provider  Adalimumab (HUMIRA) 20 MG/0.4ML KIT Inject 20 mg into the skin every 7 (seven) days.    Yes Historical Provider, MD  albuterol (PROVENTIL HFA;VENTOLIN HFA) 108 (90 BASE) MCG/ACT inhaler Inhale 2 puffs into the lungs every 4 (four) hours as needed. For shortness of breath. 09/20/12  Yes Levell July  Clelia Croft, MD  budesonide (PULMICORT) 180 MCG/ACT inhaler Inhale 2 puffs into the lungs 2 (two) times daily. For shortness of breath. 09/20/12  Yes Sherren Mocha, MD  Calcium Carbonate-Vitamin D (CALCIUM-VITAMIN D) 500-200 MG-UNIT per tablet Take 1 tablet by mouth 3 (three) times daily.   Yes Historical Provider, MD  cholecalciferol (VITAMIN D) 1000 UNITS tablet Take 1,000 Units by mouth daily.   Yes Historical Provider, MD  desogestrel-ethinyl estradiol (APRI,EMOQUETTE,SOLIA) 0.15-30 MG-MCG tablet Take 1 tablet by mouth daily. 01/29/13  Yes [name], MD  FLUoxetine (PROZAC) 20 MG tablet  Take 2 tablets (40 mg total) by mouth daily. 05/08/13  Yes [name], MD  Multiple Vitamins-Minerals (MULTIVITAMINS THER. W/MINERALS) TABS Take 1 tablet by mouth daily.   Yes Historical Provider, MD  Pantoprazole Sodium (PROTONIX PO) Take by mouth. Taking 40 mg   Yes Historical Provider, MD    Allergies  Allergen Reactions  . Amoxicillin Hives  . Cefprozil Hives  . Cephalosporins Hives and Itching    History   Social History  . Marital Status: Single    Spouse Name: N/A    Number of Children: N/A  . Years of Education: N/A   Occupational History  . Not on file.   Social History Main Topics  . Smoking status: Never Smoker   . Smokeless tobacco: Never Used  . Alcohol Use: Yes     Comment: socially  . Drug Use: No  . Sexually Active: Not Currently   Other Topics Concern  . Not on file   Social History Narrative   Marital status: single      Children: none      Lives: with parents.      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      Tobacco: none      Alcohol: socially      Drugs :none      Exercise: reguarly    Family History  Problem Relation Age of Onset  . Diabetes Father   . Hypertension Father   . Hyperlipidemia Father   . Other Sister     fibromyalgia  . Fibromyalgia Sister   . Cancer Mother     precancerous polyps in colon  . Asthma Mother   . Depression Mother   . Diabetes Mother   . Hyperlipidemia Mother   . Hypertension Mother   . Diabetes Maternal Grandmother   . ALS Maternal Grandmother   . Heart disease Maternal Grandfather   . Kidney disease Maternal Grandfather   . Diabetes Maternal Grandfather   . Diabetes Paternal Grandmother   . Diabetes Paternal Grandfather   . Parkinson's disease Paternal Grandfather        Objective:   Physical Exam  Nursing note and vitals reviewed. Constitutional: She is oriented to person, place, and time. She appears well-developed and well-nourished. No  distress.  HENT:  Head: Normocephalic and atraumatic.  Mouth/Throat: Oropharynx is clear and moist.  Eyes: Conjunctivae are normal. Pupils are equal, round, and reactive to light.  Neck: Normal range of motion. Neck supple. No thyromegaly present.  Cardiovascular: Normal rate and regular rhythm.  Exam reveals friction rub. Exam reveals no gallop.   No murmur heard. Pulmonary/Chest: Effort normal and breath sounds normal. She has no wheezes. She has no rales.  Lymphadenopathy:    She has no cervical adenopathy.  Neurological: She is alert and oriented to person, place, and time.  Skin: No rash noted.  She is not diaphoretic.  Psychiatric: She has a normal mood and affect. Her behavior is normal. Judgment and thought content normal.       Assessment & Plan:  Anemia, unspecified - Plan: CBC with Differential, Iron, Folate, Comprehensive metabolic panel  Other B-complex deficiencies - Plan: Vitamin B12, Folate, Comprehensive metabolic panel  Unspecified vitamin D deficiency - Plan: Comprehensive metabolic panel, Vitamin D 25 hydroxy  Depression - Plan: FLUoxetine (PROZAC) 20 MG tablet  Crohn's disease   1. Anemia:  Worsening; repeat labs today.   2.  Depression: improved; continue Prozac 40mg  daily; follow-up in three months before leaving for [place]. 3. Crohn's disease:  Stable; in remission; s/p GI and general surgery consultations; obtain labs with vitamin levels. 4.  Obesity: improving; s/p gastric sleeve in 03/2013; doing well.  Obtain labs.  Meds ordered this encounter  Medications  . FLUoxetine (PROZAC) 20 MG tablet    Sig: Take 2 tablets (40 mg total) by mouth daily.    Dispense:  60 tablet    Refill:  5

## 2013-05-09 LAB — FOLATE: Folate: >20 ng/mL

## 2013-05-09 LAB — VITAMIN D 25 HYDROXY (VIT D DEFICIENCY, FRACTURES): Vit D, 25-Hydroxy: 32 ng/mL (ref 30–89)

## 2013-05-09 LAB — VITAMIN B12: Vitamin B-12: 472 pg/mL (ref 211–911)

## 2013-05-15 ENCOUNTER — Encounter: Payer: Self-pay | Admitting: Family Medicine

## 2013-07-13 DIAGNOSIS — Z9889 Other specified postprocedural states: Secondary | ICD-10-CM | POA: Insufficient documentation

## 2013-08-13 ENCOUNTER — Encounter: Payer: BC Managed Care – PPO | Admitting: Family Medicine

## 2013-08-16 ENCOUNTER — Ambulatory Visit (INDEPENDENT_AMBULATORY_CARE_PROVIDER_SITE_OTHER): Payer: BC Managed Care – PPO | Admitting: Family Medicine

## 2013-08-16 VITALS — BP 122/84 | HR 69 | Temp 98.6°F | Resp 18 | Ht 66.0 in | Wt 275.0 lb

## 2013-08-16 DIAGNOSIS — F32A Depression, unspecified: Secondary | ICD-10-CM

## 2013-08-16 DIAGNOSIS — K5090 Crohn's disease, unspecified, without complications: Secondary | ICD-10-CM

## 2013-08-16 DIAGNOSIS — E669 Obesity, unspecified: Secondary | ICD-10-CM

## 2013-08-16 DIAGNOSIS — Z0000 Encounter for general adult medical examination without abnormal findings: Secondary | ICD-10-CM

## 2013-08-16 DIAGNOSIS — F329 Major depressive disorder, single episode, unspecified: Secondary | ICD-10-CM

## 2013-08-16 DIAGNOSIS — Z01419 Encounter for gynecological examination (general) (routine) without abnormal findings: Secondary | ICD-10-CM

## 2013-08-16 LAB — POCT CBC
Granulocyte percent: 60.8 %G (ref 37–80)
HCT, POC: 34.3 % — AB (ref 37.7–47.9)
Hemoglobin: 10.9 g/dL — AB (ref 12.2–16.2)
Lymph, poc: 1.5 (ref 0.6–3.4)
MCH, POC: 26.7 pg — AB (ref 27–31.2)
MCHC: 31.8 g/dL (ref 31.8–35.4)
MCV: 83.8 fL (ref 80–97)
MID (cbc): 0.3 (ref 0–0.9)
MPV: 8.8 fL (ref 0–99.8)
POC Granulocyte: 2.9 (ref 2–6.9)
POC LYMPH PERCENT: 32.7 %L (ref 10–50)
POC MID %: 6.5 %M (ref 0–12)
Platelet Count, POC: 373 10*3/uL (ref 142–424)
RBC: 4.09 M/uL (ref 4.04–5.48)
RDW, POC: 15.1 %
WBC: 4.7 10*3/uL (ref 4.6–10.2)

## 2013-08-16 LAB — POCT UA - MICROSCOPIC ONLY
Casts, Ur, LPF, POC: NEGATIVE
Crystals, Ur, HPF, POC: NEGATIVE
Mucus, UA: POSITIVE
Yeast, UA: NEGATIVE

## 2013-08-16 LAB — POCT URINALYSIS DIPSTICK
Bilirubin, UA: NEGATIVE
Blood, UA: NEGATIVE
Glucose, UA: NEGATIVE
Ketones, UA: NEGATIVE
Nitrite, UA: NEGATIVE
Protein, UA: NEGATIVE
Spec Grav, UA: >=1.03
Urobilinogen, UA: 0.2
pH, UA: 5.5

## 2013-08-16 LAB — COMPREHENSIVE METABOLIC PANEL
ALT: 16 U/L (ref 0–35)
AST: 15 U/L (ref 0–37)
Albumin: 4 g/dL (ref 3.5–5.2)
Alkaline Phosphatase: 81 U/L (ref 39–117)
BUN: 13 mg/dL (ref 6–23)
CO2: 22 mEq/L (ref 19–32)
Calcium: 9.6 mg/dL (ref 8.4–10.5)
Chloride: 103 mEq/L (ref 96–112)
Creat: 0.71 mg/dL (ref 0.50–1.10)
Glucose, Bld: 79 mg/dL (ref 70–99)
Potassium: 4.4 mEq/L (ref 3.5–5.3)
Sodium: 137 mEq/L (ref 135–145)
Total Bilirubin: 0.4 mg/dL (ref 0.3–1.2)
Total Protein: 7.2 g/dL (ref 6.0–8.3)

## 2013-08-16 LAB — LIPID PANEL
Cholesterol: 180 mg/dL (ref 0–200)
HDL: 81 mg/dL (ref >39)
LDL Cholesterol: 76 mg/dL (ref 0–99)
Total CHOL/HDL Ratio: 2.2 Ratio
Triglycerides: 113 mg/dL (ref <150)
VLDL: 23 mg/dL (ref 0–40)

## 2013-08-16 LAB — IRON: Iron: 41 ug/dL — ABNORMAL LOW (ref 42–145)

## 2013-08-16 LAB — HEMOGLOBIN A1C
Hgb A1c MFr Bld: 5.3 % (ref <5.7)
Mean Plasma Glucose: 105 mg/dL (ref <117)

## 2013-08-16 LAB — VITAMIN B12: Vitamin B-12: 275 pg/mL (ref 211–911)

## 2013-08-16 LAB — TSH: TSH: 4.553 u[IU]/mL — ABNORMAL HIGH (ref 0.350–4.500)

## 2013-08-16 MED ORDER — BUDESONIDE 180 MCG/ACT IN AEPB: 2.0000 | INHALATION_SPRAY | Freq: Two times a day (BID) | RESPIRATORY_TRACT | Status: DC

## 2013-08-16 MED ORDER — FLUOXETINE HCL 20 MG PO TABS: 40.0000 mg | ORAL_TABLET | Freq: Every day | ORAL | Status: DC

## 2013-08-16 MED ORDER — DESOGESTREL-ETHINYL ESTRADIOL 0.15-30 MG-MCG PO TABS: 1.0000 | ORAL_TABLET | Freq: Every day | ORAL | Status: DC

## 2013-08-16 MED ORDER — ALBUTEROL SULFATE HFA 108 (90 BASE) MCG/ACT IN AERS: 2.0000 | INHALATION_SPRAY | Freq: Four times a day (QID) | RESPIRATORY_TRACT | Status: DC | PRN

## 2013-08-16 NOTE — Progress Notes (Signed)
[address]   [place], [place]  [address]   [phone]  Subjective:    Patient ID: Kerry Martinez, female    DOB: 01-28-1986, 27 y.o.   MRN: [id number]  HPI This 27 y.o. female presents for evaluation for CPE.    Last physical 2012. Pap smear never.  Regular menses (5-7 days; no cramping; heavy 3-4 days). No intermenstrual bleeding. No mammogram. Tetanus UTD. Gardisil in high school. Pneumovax last year 2013. Flu vaccine 2013. Eye exam no; no glasses or contacts. Dental exam 06/2013.  Crohns disease: follow-up with GI in 06/2013.  No active disease but now having recurrent symptoms; restarted Humira to weekly in July.  Also placed on Zyfenix to treat overgrowth of bacteria.  S/p MRI of abdomen.  Allergic to contrast.  Obesity:  S/p follow-up with general bariatric surgeon in 06/2013; pleased with weight loss since vertical sleeve in 03/2013; down 17 pounds since 03/2013.  No group therapy sessions.    Depression:  Stable; major stress currently; continuing therapy over the summer; went to therapy yesterday; plans to do Skype sessions with current therapist.   Review of Systems  Constitutional: Negative for fever, chills, diaphoresis, activity change, appetite change, fatigue and unexpected weight change.  HENT: Negative for ear pain, sore throat, rhinorrhea, sneezing, trouble swallowing, voice change and postnasal drip.   Eyes: Negative for photophobia, pain, discharge, redness, itching and visual disturbance.  Respiratory: Negative for cough, shortness of breath, wheezing and stridor.   Cardiovascular: Negative for chest pain, palpitations and leg swelling.  Gastrointestinal: Positive for abdominal pain and diarrhea. Negative for nausea, vomiting, constipation, blood in stool, abdominal distention and rectal pain.  Genitourinary: Negative for dysuria, urgency, frequency, vaginal discharge, genital sores, vaginal pain, menstrual problem and pelvic pain.  Musculoskeletal: Negative  for myalgias, back pain, joint swelling, arthralgias and gait problem.  Skin: Negative for color change, pallor, rash and wound.  Allergic/Immunologic: Positive for immunocompromised state.  Neurological: Negative for dizziness, tremors, seizures, syncope, facial asymmetry, speech difficulty, weakness, light-headedness, numbness and headaches.  Hematological: Negative for adenopathy. Does not bruise/bleed easily.  Psychiatric/Behavioral: Positive for dysphoric mood. Negative for suicidal ideas, sleep disturbance and self-injury. The patient is nervous/anxious.     Past Medical History  Diagnosis Date  . Asthma   . Crohn's disease   . Polycystic ovarian syndrome   . Migraine   . Allergy     Past Surgical History  Procedure Laterality Date  . Tonsillectomy  1997  . Tympanostomy tube placement  1990  . Mastoidectomy  1991  . Ear graph  2004  . Lap band procedure  04/06/2011  . Lap band reversal  11/12/12    [facility]  . Esophagogastroduodenoscopy  11/12/12    3 gastric ulcers, hiatal hernia.  [facility].  . Admission  11/10/12    coffee ground emesis, abdominal pain.  EGD with 3 gastric ulcers and HH.  s/p lab band reversal.  [facility].  GI consults, Bariatric Surgery Consult.  . Colonoscopy w/ biopsies    . Laparoscopic gastric sleeve resection  03/27/2013  . Partial colectomy  12/26/12    Crohn's disease resection; [facility]. [facility]    Prior to Admission medications   Medication Sig Start Date End Date Taking? Authorizing Provider  Adalimumab (HUMIRA) 20 MG/0.4ML KIT Inject 20 mg into the skin every 7 (seven) days.    Yes Historical Provider, MD  albuterol (PROVENTIL HFA;VENTOLIN HFA) 108 (90 BASE) MCG/ACT inhaler Inhale 2 puffs into the lungs  every 6 (six) hours as needed. For shortness of breath. 08/16/13  Yes [name], MD  budesonide (PULMICORT) 180 MCG/ACT inhaler Inhale 2 puffs into the lungs 2 (two) times daily. For shortness of breath. 08/16/13  Yes [name], MD  Calcium Carbonate-Vitamin D (CALCIUM-VITAMIN D) 500-200 MG-UNIT per tablet Take 1 tablet by mouth 3 (three) times daily.   Yes Historical Provider, MD  cholecalciferol (VITAMIN D) 1000 UNITS tablet Take 1,000 Units by mouth daily.   Yes Historical Provider, MD  desogestrel-ethinyl estradiol (APRI,EMOQUETTE,SOLIA) 0.15-30 MG-MCG tablet Take 1 tablet by mouth daily. 08/16/13  Yes [name], MD  FLUoxetine (PROZAC) 20 MG tablet Take 2 tablets (40 mg total) by mouth daily. 08/16/13  Yes [name], MD  Multiple Vitamins-Minerals (MULTIVITAMINS THER. W/MINERALS) TABS Take 1 tablet by mouth daily.   Yes Historical Provider, MD  Pantoprazole Sodium (PROTONIX PO) Take by mouth. Taking 40 mg   Yes Historical Provider, MD    Allergies  Allergen Reactions  . Amoxicillin Hives  . Cefprozil Hives  . Cephalosporins Hives and Itching  . Multihance [Gadobenate]     History   Social History  . Marital Status: Single    Spouse Name: N/A    Number of Children: N/A  . Years of Education: N/A   Occupational History  . Not on file.   Social History Main Topics  . Smoking status: Never Smoker   . Smokeless tobacco: Never Used  . Alcohol Use: Yes     Comment: socially  . Drug Use: No  . Sexual Activity: Not Currently   Other Topics Concern  . Not on file   Social History Narrative   Marital status: single; not dating.      Children: none      Lives: with parents.      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      Education: [employer] in fall 2014.      Tobacco: none      Alcohol: socially; rarely.      Drugs :none      Exercise: sporadic exercise due to Crohn's disease.  Walking three times per week.      Sexual activity: one partner.  Last sexual activity 2 years ago.  No STDs.      Seatbelt:  100% of time.      Sunscreen: SPF 70.    Family History  Problem Relation Age of Onset  . Diabetes Father   .  Hypertension Father   . Hyperlipidemia Father   . Other Sister     fibromyalgia  . Fibromyalgia Sister   . Cancer Mother     precancerous polyps in colon  . Asthma Mother   . Depression Mother   . Diabetes Mother   . Hyperlipidemia Mother   . Hypertension Mother   . Diabetes Maternal Grandmother   . ALS Maternal Grandmother   . Heart disease Maternal Grandfather   . Kidney disease Maternal Grandfather   . Diabetes Maternal Grandfather   . Diabetes Paternal Grandmother   . Diabetes Paternal Grandfather   . Parkinson's disease Paternal Grandfather        Objective:   Physical Exam  Nursing note and vitals reviewed. Constitutional: She is oriented to person, place, and time. She appears well-developed and well-nourished. No distress.  HENT:  Head: Normocephalic and atraumatic.  Right Ear: External ear normal.  Left Ear: External ear  normal.  Nose: Nose normal.  Mouth/Throat: Oropharynx is clear and moist.  Eyes: Conjunctivae and EOM are normal. Pupils are equal, round, and reactive to light.  Neck: Normal range of motion. Neck supple. No thyromegaly present.  Cardiovascular: Normal rate, regular rhythm, normal heart sounds and intact distal pulses.  Exam reveals no gallop and no friction rub.   No murmur heard. Pulmonary/Chest: Effort normal and breath sounds normal. She has no wheezes. She has no rales.  Abdominal: Soft. Bowel sounds are normal. She exhibits no distension and no mass. There is no tenderness. There is no rebound and no guarding.  Genitourinary: Uterus normal. No breast swelling, tenderness, discharge or bleeding. There is no rash, tenderness, lesion or injury on the right labia. There is no rash, tenderness, lesion or injury on the left labia. Cervix exhibits no motion tenderness and no friability. Right adnexum displays no mass, no tenderness and no fullness. Left adnexum displays no mass, no tenderness and no fullness. There is erythema around the vagina. No  tenderness or bleeding around the vagina. No foreign body around the vagina. Vaginal discharge found.  Speculum exam poorly tolerated; pap smear unable to be obtained due to patient discomfort.  Musculoskeletal: Normal range of motion.  Lymphadenopathy:    She has no cervical adenopathy.  Neurological: She is alert and oriented to person, place, and time. No cranial nerve deficit. She exhibits normal muscle tone. Coordination normal.  Skin: Skin is warm and dry. No rash noted. She is not diaphoretic. No erythema. No pallor.  Psychiatric: She has a normal mood and affect. Her behavior is normal. Judgment and thought content normal.       Assessment & Plan:  Routine general medical examination at a health care facility - Plan: POCT UA - Microscopic Only, POCT urinalysis dipstick, POCT CBC, Comprehensive metabolic panel, TSH, Vit D  25 hydroxy (rtn osteoporosis monitoring), Vitamin B12, Iron, Hemoglobin A1c  Crohn's disease - Plan: Comprehensive metabolic panel  Obesity, unspecified - Plan: POCT UA - Microscopic Only, Lipid panel  Routine gynecological examination  Depression - Plan: FLUoxetine (PROZAC) 20 MG tablet   1. CPE:  Anticipatory guidance -- continued exercise and weight loss.  Pap smear unsuccessful for second attempt; recommend gynecological evaluation in upcoming year to obtain pap smear; pt agreeable.  Immunizations UTD.  Obtain labs. 2.  Crohn's disease: stable; obtain labs.  S/p recent follow-up with GI. 3.  Obesity: improving; s/p gastric sleeve placement with further weight loss; no recent issues; obtain labs. 4.  Gynecological exam; completed; pap smear unsuccessful today due to patient discomfort. Recommend gynecological consultation in upcoming year.  5.  Depression: stable on current regimen; refill provided; follow-up in six months or sooner if warranted.   6. Irregular menses: stable on Apri.  No changes to management made at this time.  Refills provided for one year.    Meds ordered this encounter  Medications  . albuterol (PROVENTIL HFA;VENTOLIN HFA) 108 (90 BASE) MCG/ACT inhaler    Sig: Inhale 2 puffs into the lungs every 6 (six) hours as needed. For shortness of breath.    Dispense:  1 Inhaler    Refill:  5  . budesonide (PULMICORT) 180 MCG/ACT inhaler    Sig: Inhale 2 puffs into the lungs 2 (two) times daily. For shortness of breath.    Dispense:  1 Inhaler    Refill:  11  . desogestrel-ethinyl estradiol (APRI,EMOQUETTE,SOLIA) 0.15-30 MG-MCG tablet    Sig: Take 1 tablet by mouth daily.  Dispense:  1 Package    Refill:  11  . FLUoxetine (PROZAC) 20 MG tablet    Sig: Take 2 tablets (40 mg total) by mouth daily.    Dispense:  60 tablet    Refill:  11

## 2013-08-16 NOTE — Patient Instructions (Signed)
1.  RECOMMEND EVALUATION BY GYNECOLOGY IN UPCOMING 6-12 MONTHS FOR PAP SMEAR.

## 2013-08-17 LAB — VITAMIN D 25 HYDROXY (VIT D DEFICIENCY, FRACTURES): Vit D, 25-Hydroxy: 44 ng/mL (ref 30–89)

## 2013-08-23 ENCOUNTER — Encounter: Payer: Self-pay | Admitting: Family Medicine

## 2013-10-17 ENCOUNTER — Encounter: Payer: Self-pay | Admitting: Family Medicine

## 2013-10-17 ENCOUNTER — Ambulatory Visit (INDEPENDENT_AMBULATORY_CARE_PROVIDER_SITE_OTHER): Payer: BC Managed Care – PPO | Admitting: Family Medicine

## 2013-10-17 VITALS — BP 130/70 | HR 67 | Temp 98.3°F | Resp 16 | Ht 65.0 in | Wt 271.6 lb

## 2013-10-17 DIAGNOSIS — E538 Deficiency of other specified B group vitamins: Secondary | ICD-10-CM

## 2013-10-17 DIAGNOSIS — D509 Iron deficiency anemia, unspecified: Secondary | ICD-10-CM

## 2013-10-17 DIAGNOSIS — E669 Obesity, unspecified: Secondary | ICD-10-CM

## 2013-10-17 DIAGNOSIS — K5090 Crohn's disease, unspecified, without complications: Secondary | ICD-10-CM

## 2013-10-17 DIAGNOSIS — F329 Major depressive disorder, single episode, unspecified: Secondary | ICD-10-CM

## 2013-10-17 DIAGNOSIS — F3289 Other specified depressive episodes: Secondary | ICD-10-CM

## 2013-10-17 DIAGNOSIS — Z23 Encounter for immunization: Secondary | ICD-10-CM

## 2013-10-17 DIAGNOSIS — F32A Depression, unspecified: Secondary | ICD-10-CM

## 2013-10-17 MED ORDER — TRAZODONE HCL 50 MG PO TABS: 50.0000 mg | ORAL_TABLET | Freq: Every evening | ORAL | Status: DC | PRN

## 2013-10-17 NOTE — Progress Notes (Signed)
[address]   [place], [place]  [address]   [phone]  Subjective:    Patient ID: Kerry Martinez, female    DOB: June 10, 1986, 27 y.o.   MRN: [id number]  HPI This 27 y.o. female presents for evaluation of the following:  1.  Depression:  Two month follow-up; no changes to management made at last visit. Very happy in school; has adjusted well to graduate program.  Continues to struggle emotionally with chronic illness and its impact on overall well-being.  Does not feel well a lot of days.  Has not Skyped with therapist since leaving in 07/2013 but plans to start because it was very beneficial.  2. Crohn's disease:  Establishing with GI near graduate school; continues to suffer with frequent abdominal pain.    3. Insomnia:  Persistent; having difficulties sleeping.  4. Anemia:  Taking iron and B12 daily; taking B12 every day; tries to remember iron.  Also takes Calcium.     Review of Systems  Constitutional: Negative for fever, chills, diaphoresis and fatigue.  Gastrointestinal: Positive for abdominal pain and diarrhea. Negative for nausea and vomiting.  Psychiatric/Behavioral: Positive for sleep disturbance and dysphoric mood. Negative for suicidal ideas and self-injury. The patient is nervous/anxious.    Past Medical History  Diagnosis Date  . Asthma   . Crohn's disease   . Polycystic ovarian syndrome 12/27/2010    s/p endocrinology consult/[facility].  . Migraine   . Allergy    Past Surgical History  Procedure Laterality Date  . Tonsillectomy  1997  . Tympanostomy tube placement  1990  . Mastoidectomy  1991  . Ear graph  2004  . Lap band procedure  04/06/2011  . Lap band reversal  11/12/12    [facility]  . Esophagogastroduodenoscopy  11/12/12    3 gastric ulcers, hiatal hernia.  [facility].  . Admission  11/10/12    coffee ground emesis, abdominal pain.  EGD with 3 gastric ulcers and HH.  s/p lab band reversal.  [facility].  GI consults, Bariatric Surgery  Consult.  . Colonoscopy w/ biopsies    . Laparoscopic gastric sleeve resection  03/27/2013  . Partial colectomy  12/26/12    Crohn's disease resection; [facility]. [facility]   Allergies  Allergen Reactions  . Amoxicillin Hives  . Cefprozil Hives  . Cephalosporins Hives and Itching  . Multihance [Gadobenate]        Objective:   Physical Exam  Constitutional: She is oriented to person, place, and time. She appears well-developed and well-nourished. No distress.  HENT:  Head: Normocephalic and atraumatic.  Right Ear: External ear normal.  Left Ear: External ear normal.  Nose: Nose normal.  Mouth/Throat: Oropharynx is clear and moist.  Eyes: Conjunctivae and EOM are normal. Pupils are equal, round, and reactive to light.  Neck: Normal range of motion. Neck supple. Carotid bruit is not present. No thyromegaly present.  Cardiovascular: Normal rate, regular rhythm, normal heart sounds and intact distal pulses.  Exam reveals no gallop and no friction rub.   No murmur heard. Pulmonary/Chest: Effort normal and breath sounds normal. She has no wheezes. She has no rales.  Abdominal: Soft. Bowel sounds are normal. She exhibits no distension and no mass. There is no tenderness. There is no rebound and no guarding.  Lymphadenopathy:    She has no cervical adenopathy.  Neurological: She is alert and oriented to person, place, and time. No cranial nerve deficit.  Skin: Skin is warm and dry. No rash noted.  She is not diaphoretic. No erythema. No pallor.  Psychiatric: She has a normal mood and affect. Her behavior is normal.   FLU VACCINE ADMINISTERED.    Assessment & Plan:  Iron deficiency anemia, unspecified - Plan: TSH, CBC with Differential, Comprehensive metabolic panel, Iron  Obesity, unspecified - Plan: TSH, CBC with Differential, Comprehensive metabolic panel, Iron  Vitamin Z61 deficiency - Plan: Vitamin B12  Need for prophylactic vaccination and inoculation against influenza - Plan: Flu  Vaccine QUAD 36+ mos IM  Crohn's disease  Depression  1. Depression: persistent but stable; no changes to medication; contact therapist to initiate SKYPE sessions. 2.  Crohn's disease: stable; establishing with new GI at graduate school. 3.  Insomnia: worsening; rx for Trazodone provided. 4.  Vitamin B12 deficiency: stable; obtain labs. Continue daily supplement. 5. Iron deficiency: stable; obtain labs; encourage pt to remember iron supplement. 6. Obesity: losing weight despite multiple transitions; obtain labs. 7.  S/p flu vaccine.  Meds ordered this encounter  Medications  . traZODone (DESYREL) 50 MG tablet    Sig: Take 1-2 tablets (50-100 mg total) by mouth at bedtime as needed for sleep.    Dispense:  45 tablet    Refill:  5   Nilda Simmer, M.D.  [facility] [address] [place], [place]  [address] [phone] phone [phone] fax

## 2013-10-18 LAB — CBC WITH DIFFERENTIAL/PLATELET
Basophils Absolute: 0.1 10*3/uL (ref 0.0–0.1)
Basophils Relative: 1 % (ref 0–1)
Eosinophils Absolute: 0.1 10*3/uL (ref 0.0–0.7)
Eosinophils Relative: 1 % (ref 0–5)
HCT: 33 % — ABNORMAL LOW (ref 36.0–46.0)
Hemoglobin: 11 g/dL — ABNORMAL LOW (ref 12.0–15.0)
Lymphocytes Relative: 28 % (ref 12–46)
Lymphs Abs: 2.1 10*3/uL (ref 0.7–4.0)
MCH: 26.8 pg (ref 26.0–34.0)
MCHC: 33.3 g/dL (ref 30.0–36.0)
MCV: 80.3 fL (ref 78.0–100.0)
Monocytes Absolute: 0.6 10*3/uL (ref 0.1–1.0)
Monocytes Relative: 8 % (ref 3–12)
Neutro Abs: 4.6 10*3/uL (ref 1.7–7.7)
Neutrophils Relative %: 62 % (ref 43–77)
Platelets: 411 10*3/uL — ABNORMAL HIGH (ref 150–400)
RBC: 4.11 MIL/uL (ref 3.87–5.11)
RDW: 14.1 % (ref 11.5–15.5)
WBC: 7.4 10*3/uL (ref 4.0–10.5)

## 2013-10-18 LAB — COMPREHENSIVE METABOLIC PANEL
ALT: 11 U/L (ref 0–35)
AST: 14 U/L (ref 0–37)
Albumin: 4.1 g/dL (ref 3.5–5.2)
Alkaline Phosphatase: 74 U/L (ref 39–117)
BUN: 16 mg/dL (ref 6–23)
CO2: 20 mEq/L (ref 19–32)
Calcium: 9.7 mg/dL (ref 8.4–10.5)
Chloride: 103 mEq/L (ref 96–112)
Creat: 0.65 mg/dL (ref 0.50–1.10)
Glucose, Bld: 85 mg/dL (ref 70–99)
Potassium: 4.3 mEq/L (ref 3.5–5.3)
Sodium: 138 mEq/L (ref 135–145)
Total Bilirubin: 0.3 mg/dL (ref 0.3–1.2)
Total Protein: 7.5 g/dL (ref 6.0–8.3)

## 2013-10-18 LAB — VITAMIN B12: Vitamin B-12: 615 pg/mL (ref 211–911)

## 2013-10-18 LAB — IRON: Iron: 45 ug/dL (ref 42–145)

## 2013-10-18 LAB — TSH: TSH: 3.519 u[IU]/mL (ref 0.350–4.500)

## 2013-10-19 ENCOUNTER — Encounter: Payer: Self-pay | Admitting: Family Medicine

## 2013-11-14 IMAGING — CT CT ABD-PELV W/ CM
1 of 2 series · 15 of 32 positions shown, 19 images · IV contrast (APPLIED)
Comparison: None.

CLINICAL DATA: Progressive abdominal pain and nausea and vomiting.
Gastric lap band.

CT ABDOMEN AND PELVIS WITH CONTRAST
TECHNIQUE: Multidetector CT imaging of the abdomen and pelvis was
performed following the standard protocol during bolus
administration of intravenous contrast.
Contrast: 100mL OMNIPAQUE IOHEXOL 300 MG/ML IV SOLN

[Series 2: abd/pel with · axial · 0.86mm/px · z∈[-546,-96]mm · 15 of 100 slices shown, 19 images]
[im 5/100  soft-tissue]
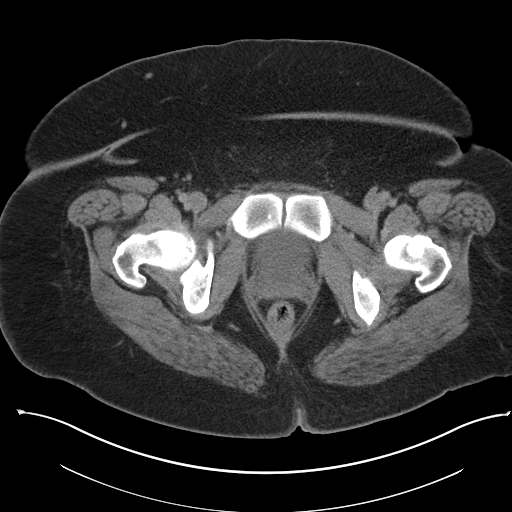
[im 5/100  bone]
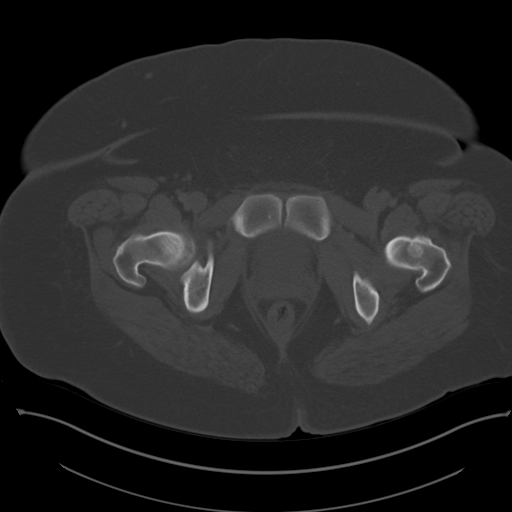
[im 13/100  soft-tissue]
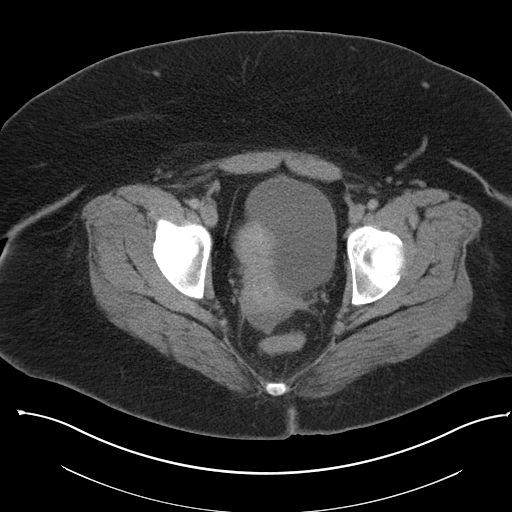
[im 22/100  soft-tissue]
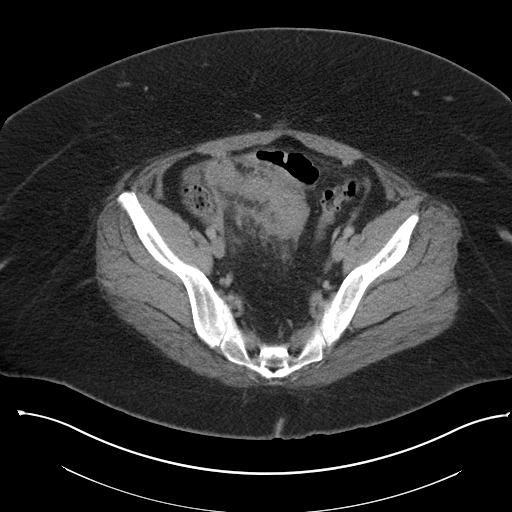
[im 26/100  soft-tissue]
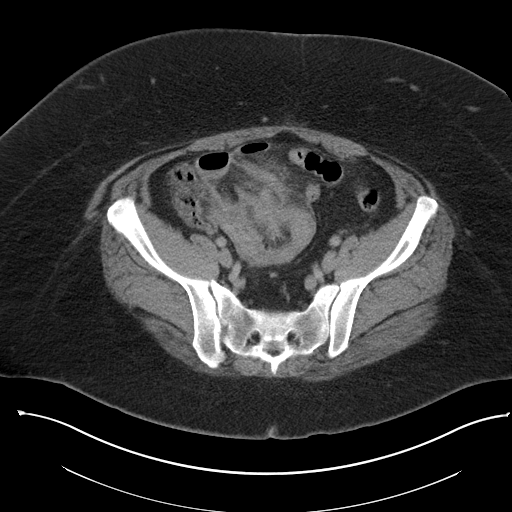
[im 35/100  soft-tissue]
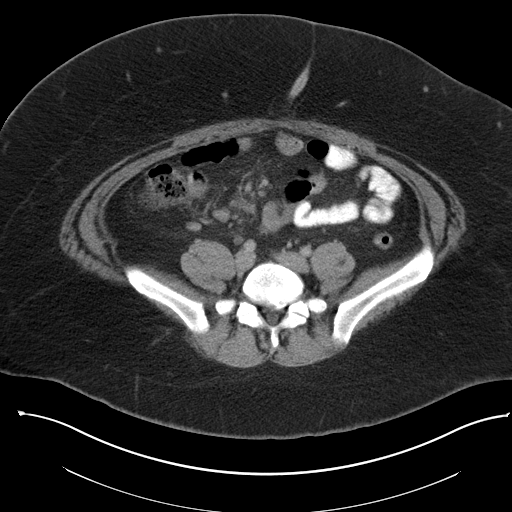
[im 44/100  soft-tissue]
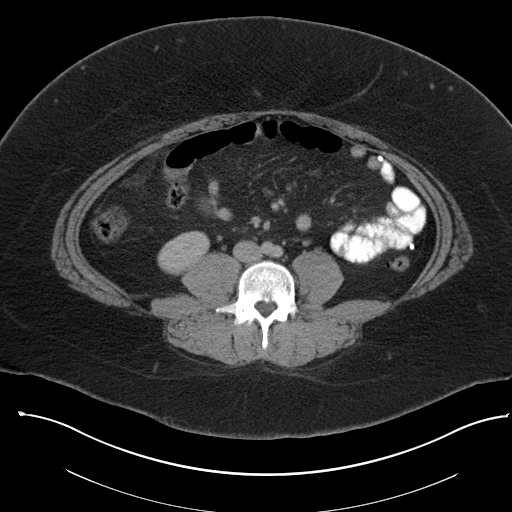
[im 52/100  soft-tissue]
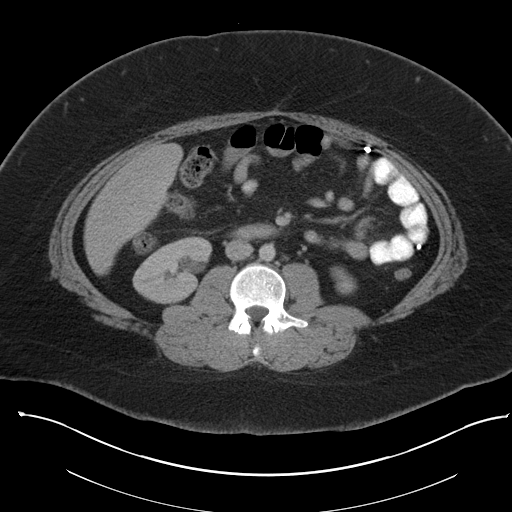
[im 56/100  soft-tissue]
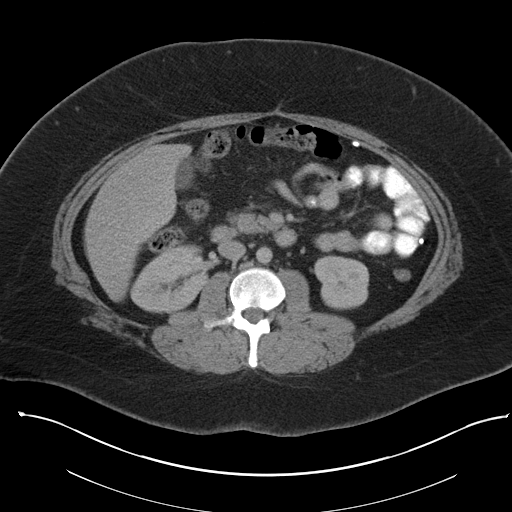
[im 65/100  soft-tissue]
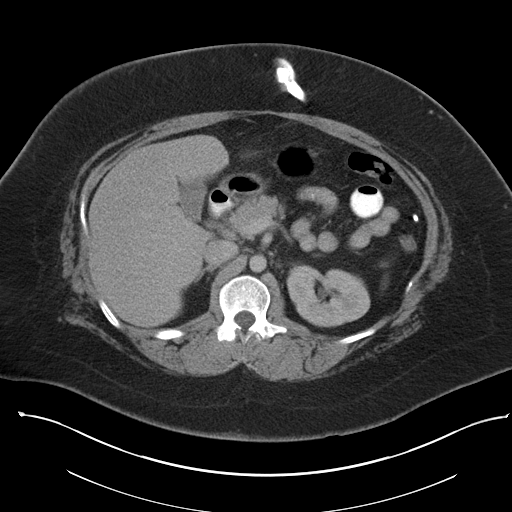
[im 65/100  bone]
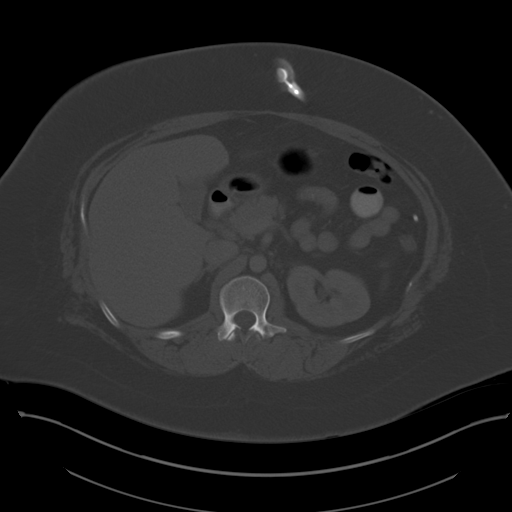
[im 74/100  soft-tissue]
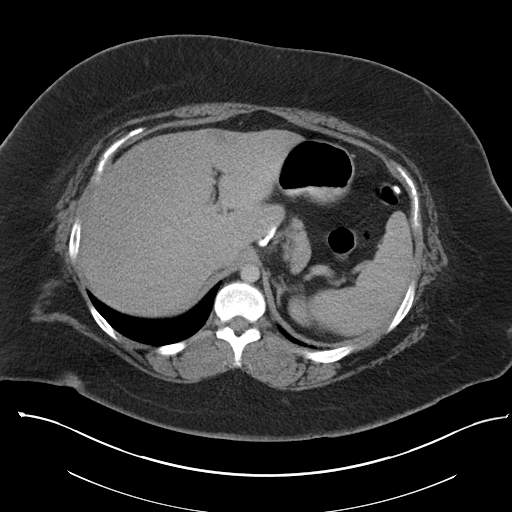
[im 78/100  soft-tissue]
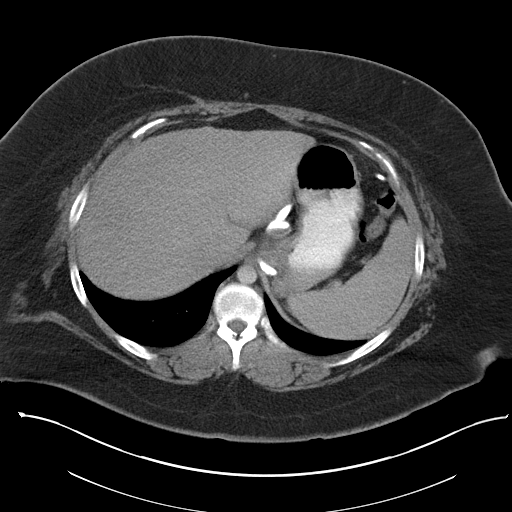
[im 82/100  lung]
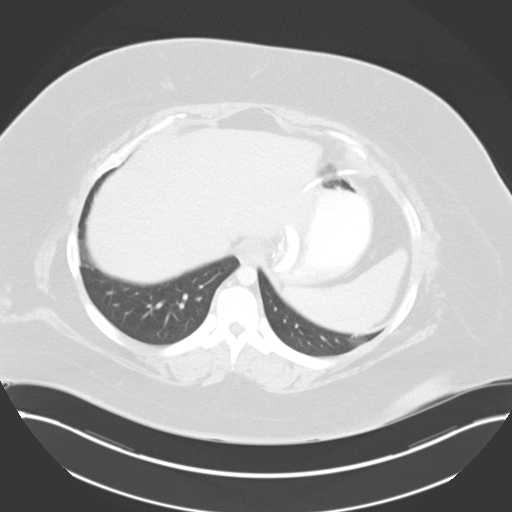
[im 87/100  soft-tissue]
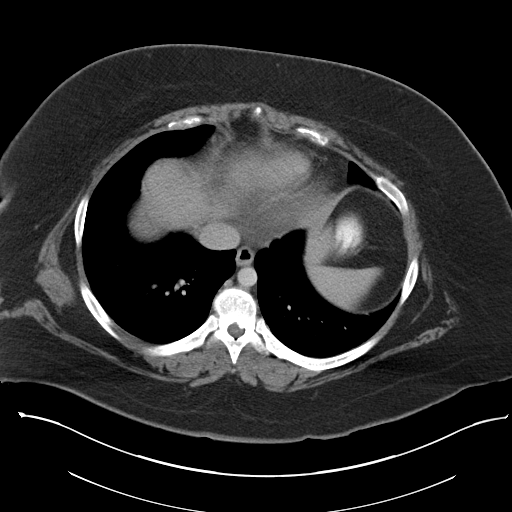
[im 87/100  lung]
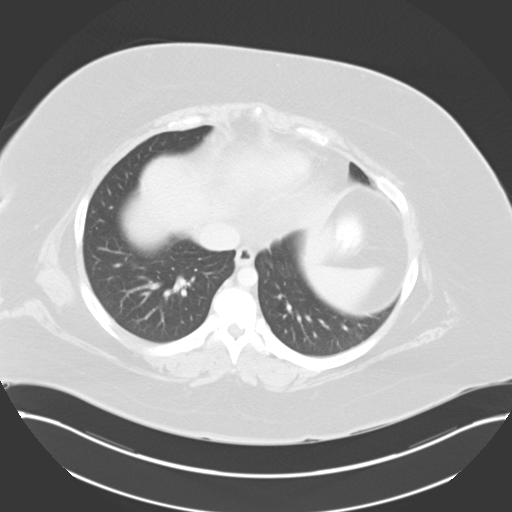
[im 91/100  lung]
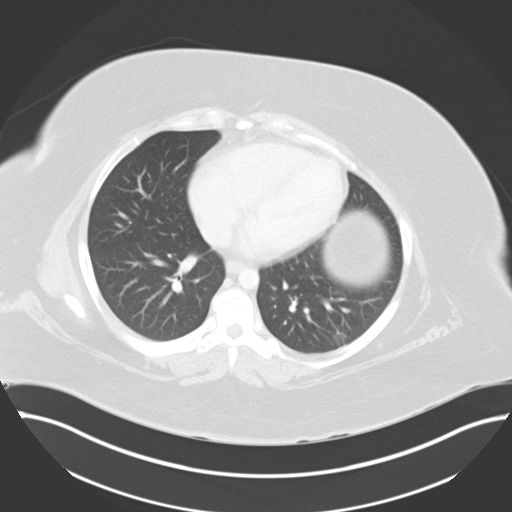
[im 95/100  soft-tissue]
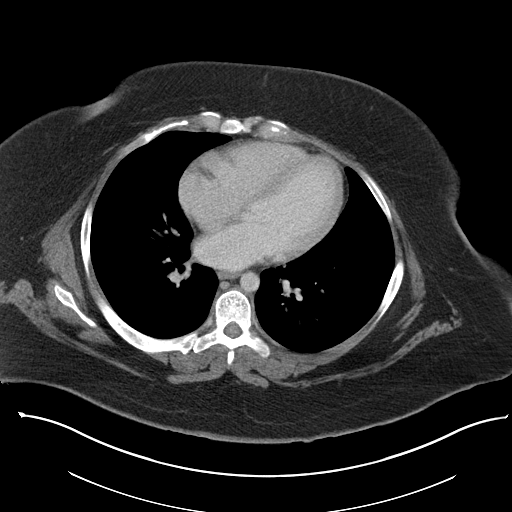
[im 95/100  lung]
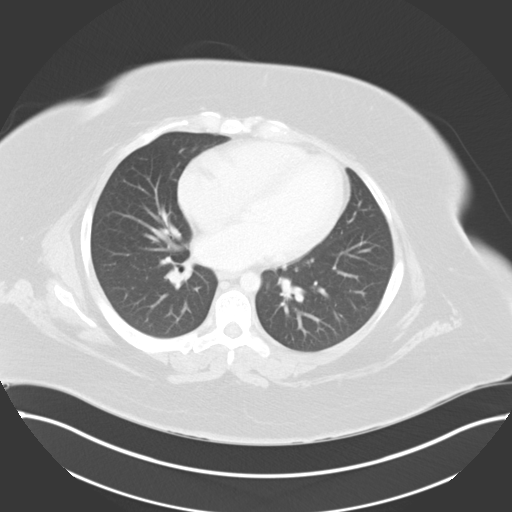

[15 of 32 positions shown; findings below may reference images not displayed]

FINDINGS: An inflammatory mass is seen involving the right lower
quadrant small bowel mesentery and distal small bowel loops
including the terminal ileum.  A normal-appearing appendix is seen
separate from this location.  There is also shotty right lower
quadrant mesenteric lymphadenopathy, which is likely reactive in
etiology.  This is suspicious for Crohn's disease.  There is no
evidence of bowel obstruction. There is no evidence of abscess or
free fluid.

Gastric lap band is seen in appropriate position.  The abdominal
parenchymal organs and gallbladder are normal in appearance.  No
evidence of hydronephrosis.  Uterus and adnexa are unremarkable.
No soft tissue masses or lymphadenopathy identified.
IMPRESSION: 1.  Inflammatory mass in the right lower quadrant involving distal
ileum and small bowel mesentery, with mild mesenteric
lymphadenopathy which is likely reactive.  The appendix is
visualized separately, and this is highly suspicious for Crohn's
disease.  Differential diagnosis includes infectious enteritis and
lymphoma.
2.  No evidence of abscess, free fluid, or bowel obstruction.

## 2013-12-17 ENCOUNTER — Ambulatory Visit: Payer: BC Managed Care – PPO | Admitting: Family Medicine

## 2013-12-19 ENCOUNTER — Ambulatory Visit (INDEPENDENT_AMBULATORY_CARE_PROVIDER_SITE_OTHER): Payer: BC Managed Care – PPO | Admitting: Physician Assistant

## 2013-12-19 VITALS — BP 134/72 | HR 119 | Temp 101.1°F | Resp 18 | Ht 65.0 in | Wt 266.0 lb

## 2013-12-19 DIAGNOSIS — R509 Fever, unspecified: Secondary | ICD-10-CM

## 2013-12-19 DIAGNOSIS — J101 Influenza due to other identified influenza virus with other respiratory manifestations: Secondary | ICD-10-CM

## 2013-12-19 DIAGNOSIS — Z20828 Contact with and (suspected) exposure to other viral communicable diseases: Secondary | ICD-10-CM

## 2013-12-19 DIAGNOSIS — J45909 Unspecified asthma, uncomplicated: Secondary | ICD-10-CM

## 2013-12-19 DIAGNOSIS — J111 Influenza due to unidentified influenza virus with other respiratory manifestations: Secondary | ICD-10-CM

## 2013-12-19 DIAGNOSIS — D849 Immunodeficiency, unspecified: Secondary | ICD-10-CM

## 2013-12-19 LAB — POCT CBC
Granulocyte percent: 84.2 %G — AB (ref 37–80)
HCT, POC: 38 % (ref 37.7–47.9)
Hemoglobin: 11.5 g/dL — AB (ref 12.2–16.2)
Lymph, poc: 0.9 (ref 0.6–3.4)
MCH, POC: 25.6 pg — AB (ref 27–31.2)
MCHC: 30.3 g/dL — AB (ref 31.8–35.4)
MCV: 84.6 fL (ref 80–97)
MID (cbc): 0.3 (ref 0–0.9)
MPV: 8.1 fL (ref 0–99.8)
POC Granulocyte: 6.5 (ref 2–6.9)
POC LYMPH PERCENT: 11.4 %L (ref 10–50)
POC MID %: 4.4 %M (ref 0–12)
Platelet Count, POC: 315 10*3/uL (ref 142–424)
RBC: 4.49 M/uL (ref 4.04–5.48)
RDW, POC: 15.7 %
WBC: 7.7 10*3/uL (ref 4.6–10.2)

## 2013-12-19 LAB — POCT INFLUENZA A/B
Influenza A, POC: POSITIVE
Influenza B, POC: NEGATIVE

## 2013-12-19 MED ORDER — HYDROCODONE-HOMATROPINE 5-1.5 MG/5ML PO SYRP: 5.0000 mL | ORAL_SOLUTION | Freq: Three times a day (TID) | ORAL | Status: DC | PRN

## 2013-12-19 MED ORDER — ALBUTEROL SULFATE (2.5 MG/3ML) 0.083% IN NEBU: 2.5000 mg | INHALATION_SOLUTION | Freq: Four times a day (QID) | RESPIRATORY_TRACT | Status: DC | PRN

## 2013-12-19 MED ORDER — BUDESONIDE 180 MCG/ACT IN AEPB: 2.0000 | INHALATION_SPRAY | Freq: Two times a day (BID) | RESPIRATORY_TRACT | Status: DC

## 2013-12-19 MED ORDER — BENZONATATE 100 MG PO CAPS: 100.0000 mg | ORAL_CAPSULE | Freq: Three times a day (TID) | ORAL | Status: DC | PRN

## 2013-12-19 MED ORDER — ALBUTEROL SULFATE HFA 108 (90 BASE) MCG/ACT IN AERS: 2.0000 | INHALATION_SPRAY | Freq: Four times a day (QID) | RESPIRATORY_TRACT | Status: DC | PRN

## 2013-12-19 MED ORDER — OSELTAMIVIR PHOSPHATE 75 MG PO CAPS: 75.0000 mg | ORAL_CAPSULE | Freq: Two times a day (BID) | ORAL | Status: DC

## 2013-12-19 MED ORDER — IPRATROPIUM BROMIDE 0.03 % NA SOLN: 2.0000 | Freq: Two times a day (BID) | NASAL | Status: DC

## 2013-12-19 MED ORDER — ALBUTEROL SULFATE (2.5 MG/3ML) 0.083% IN NEBU
2.5000 mg | INHALATION_SOLUTION | Freq: Once | RESPIRATORY_TRACT | Status: AC
  Administered 2013-12-19: 2.5 mg via RESPIRATORY_TRACT

## 2013-12-19 NOTE — Patient Instructions (Signed)
Begin taking the Tamiflu today as directed.  Finish the full course.  Continue albuterol as needed.  Use the Tessalon Perles every 8 hours as needed for cough.  Hycodan syrup at night - will probably make you sleepy!  Atrovent nasal spray 2-3 times per day for congestion  Plenty of fluids - water is best!  Rest. Continue OTC meds if needed  Please let us know if anything is worsening or not improving   Influenza, Adult Influenza ("the flu") is a viral infection of the respiratory tract. It occurs more often in winter months because people spend more time in close contact with one another. Influenza can make you feel very sick. Influenza easily spreads from person to person (contagious). CAUSES  Influenza is caused by a virus that infects the respiratory tract. You can catch the virus by breathing in droplets from an infected person's cough or sneeze. You can also catch the virus by touching something that was recently contaminated with the virus and then touching your mouth, nose, or eyes. SYMPTOMS  Symptoms typically last 4 to 10 days and may include:  Fever.  Chills.  Headache, body aches, and muscle aches.  Sore throat.  Chest discomfort and cough.  Poor appetite.  Weakness or feeling tired.  Dizziness.  Nausea or vomiting. DIAGNOSIS  Diagnosis of influenza is often made based on your history and a physical exam. A nose or throat swab test can be done to confirm the diagnosis. RISKS AND COMPLICATIONS You may be at risk for a more severe case of influenza if you smoke cigarettes, have diabetes, have chronic heart disease (such as heart failure) or lung disease (such as asthma), or if you have a weakened immune system. Elderly people and pregnant women are also at risk for more serious infections. The most common complication of influenza is a lung infection (pneumonia). Sometimes, this complication can require emergency medical care and may be life-threatening. PREVENTION  An  annual influenza vaccination (flu shot) is the best way to avoid getting influenza. An annual flu shot is now routinely recommended for all adults in the [place] TREATMENT  In mild cases, influenza goes away on its own. Treatment is directed at relieving symptoms. For more severe cases, your caregiver may prescribe antiviral medicines to shorten the sickness. Antibiotic medicines are not effective, because the infection is caused by a virus, not by bacteria. HOME CARE INSTRUCTIONS  Only take over-the-counter or prescription medicines for pain, discomfort, or fever as directed by your caregiver.  Use a cool mist humidifier to make breathing easier.  Get plenty of rest until your temperature returns to normal. This usually takes 3 to 4 days.  Drink enough fluids to keep your urine clear or pale yellow.  Cover your mouth and nose when coughing or sneezing, and wash your hands well to avoid spreading the virus.  Stay home from work or school until your fever has been gone for at least 1 full day. SEEK MEDICAL CARE IF:   You have chest pain or a deep cough that worsens or produces more mucus.  You have nausea, vomiting, or diarrhea. SEEK IMMEDIATE MEDICAL CARE IF:   You have difficulty breathing, shortness of breath, or your skin or nails turn bluish.  You have severe neck pain or stiffness.  You have a severe headache, facial pain, or earache.  You have a worsening or recurring fever.  You have nausea or vomiting that cannot be controlled. MAKE SURE YOU:  Understand these instructions.  Will watch your condition.  Will get help right away if you are not doing well or get worse. Document Released: 12/10/2000 Document Revised: 06/13/2012 Document Reviewed: 03/13/2012 [facility] Patient Information 2014 [place]Care, [place].

## 2013-12-19 NOTE — Progress Notes (Signed)
Subjective:    Patient ID: Kerry Martinez, female    DOB: Dec 26, 1986, 27 y.o.   MRN: [id number]  HPI   Kerry Martinez is a very pleasant 27 yr old female here with concern for illness.  Symptoms include ST, cough, congestion, body aches, fever tmax 101.83F.  She is feeling short of breath.  Lifelong asthma treated with albuterol and pulmicort.  Cough is productive today.  She was up all night coughing.  Has used OTC meds without relief.  Symptoms started a few days ago but are worsening.    Pt's sister dx'd with flu 5 days ago.  Pt did get flu shot, but she is immunocompromised due to treatment with Humira for Crohn's   Review of Systems  Constitutional: Positive for fever and fatigue. Negative for chills.  HENT: Positive for congestion, rhinorrhea and sore throat.   Respiratory: Positive for cough and shortness of breath. Negative for wheezing.   Cardiovascular: Negative.   Gastrointestinal: Negative.   Musculoskeletal: Positive for arthralgias and myalgias.  Skin: Negative.   Neurological: Negative.        Objective:   Physical Exam  Vitals reviewed. Constitutional: She is oriented to person, place, and time. She appears well-developed and well-nourished. She appears ill. No distress.  HENT:  Head: Normocephalic and atraumatic.  Right Ear: Tympanic membrane and ear canal normal.  Left Ear: Tympanic membrane and ear canal normal.  Mouth/Throat: Uvula is midline and mucous membranes are normal. Posterior oropharyngeal erythema present.  Cobblestoning posterior pharynx  Eyes: Conjunctivae are normal. No scleral icterus.  Neck: Neck supple.  Cardiovascular: Normal rate, regular rhythm and normal heart sounds.   Pulmonary/Chest: Effort normal. She has decreased breath sounds (throughout). She has no wheezes. She has no rales.  Lymphadenopathy:    She has no cervical adenopathy.  Neurological: She is alert and oriented to person, place, and time.  Skin: Skin is warm and dry.    Psychiatric: She has a normal mood and affect. Her behavior is normal.    Albuterol neb x 1 in clinic with improvement in symptoms; improved air movement on re-exam  Results for orders placed in visit on 12/19/13  POCT INFLUENZA A/B      Result Value Range   Influenza A, POC Positive     Influenza B, POC Negative    POCT CBC      Result Value Range   WBC 7.7  4.6 - 10.2 K/uL   Lymph, poc 0.9  0.6 - 3.4   POC LYMPH PERCENT 11.4  10 - 50 %L   MID (cbc) 0.3  0 - 0.9   POC MID % 4.4  0 - 12 %M   POC Granulocyte 6.5  2 - 6.9   Granulocyte percent 84.2 (*) 37 - 80 %G   RBC 4.49  4.04 - 5.48 M/uL   Hemoglobin 11.5 (*) 12.2 - 16.2 g/dL   HCT, POC 95.6  21.3 - 47.9 %   MCV 84.6  80 - 97 fL   MCH, POC 25.6 (*) 27 - 31.2 pg   MCHC 30.3 (*) 31.8 - 35.4 g/dL   RDW, POC 08.6     Platelet Count, POC 315  142 - 424 K/uL   MPV 8.1  0 - 99.8 fL         Assessment & Plan:  Influenza A - Plan: oseltamivir (TAMIFLU) 75 MG capsule, ipratropium (ATROVENT) 0.03 % nasal spray, benzonatate (TESSALON) 100 MG capsule  Fever, unspecified - Plan:  POCT Influenza A/B, POCT CBC  Exposure to influenza  Immunocompromised  Asthma - Plan: albuterol (PROVENTIL) (2.5 MG/3ML) 0.083% nebulizer solution 2.5 mg, albuterol (PROVENTIL) (2.5 MG/3ML) 0.083% nebulizer solution, albuterol (PROVENTIL HFA;VENTOLIN HFA) 108 (90 BASE) MCG/ACT inhaler, budesonide (PULMICORT) 180 MCG/ACT inhaler   Kerry Martinez is a very pleasant 27 yr old female here with influenza.  Symptoms started 3+ days ago, but will start Tamiflu today given her immunocompromised state.  WBC count is WNL today, so less concerned for bacterial infection at this point.  Symptomatic treatment with atrovent, tessalon, hycodan. Push fluids, rest.  RTC if worsening or not improving.  Pt with lifelong hx asthma.  Complaining of SOB today, but SpO2 100% on RA.  Some improvement with albuterol neb.  Objectively pt with better air movement after neb.  I have  refilled her asthma medications per her request.   E. Frances Furbish MHS, PA-C [facility] 12/24/20142:44 PM

## 2013-12-24 ENCOUNTER — Telehealth: Payer: Self-pay

## 2013-12-24 NOTE — Telephone Encounter (Signed)
Called her, she is improving. She will try ibuprofen for the sore throat, she has not done this. She indicates she will give this another 1-2 days and if not improving she will return to clinic. She states her asthma seems to be controlled at this point. Encouraged her to rest / push fluids return if worsening, but okay to wait the 1-2 days as long as nothing has worsened.

## 2013-12-24 NOTE — Telephone Encounter (Signed)
Pt still c/o of sore throat and cough. (NO FEVER OR CONGESTION)  BEST# [phone]

## 2014-01-02 ENCOUNTER — Ambulatory Visit (INDEPENDENT_AMBULATORY_CARE_PROVIDER_SITE_OTHER): Payer: BC Managed Care – PPO | Admitting: Family Medicine

## 2014-01-02 VITALS — BP 130/70 | HR 101 | Temp 98.7°F | Resp 16 | Ht 66.5 in | Wt 264.0 lb

## 2014-01-02 DIAGNOSIS — K5090 Crohn's disease, unspecified, without complications: Secondary | ICD-10-CM

## 2014-01-02 DIAGNOSIS — F32A Depression, unspecified: Secondary | ICD-10-CM

## 2014-01-02 DIAGNOSIS — D649 Anemia, unspecified: Secondary | ICD-10-CM

## 2014-01-02 DIAGNOSIS — F329 Major depressive disorder, single episode, unspecified: Secondary | ICD-10-CM

## 2014-01-02 DIAGNOSIS — Z9884 Bariatric surgery status: Secondary | ICD-10-CM

## 2014-01-02 DIAGNOSIS — E538 Deficiency of other specified B group vitamins: Secondary | ICD-10-CM

## 2014-01-02 DIAGNOSIS — J101 Influenza due to other identified influenza virus with other respiratory manifestations: Secondary | ICD-10-CM

## 2014-01-02 DIAGNOSIS — E559 Vitamin D deficiency, unspecified: Secondary | ICD-10-CM

## 2014-01-02 DIAGNOSIS — R87615 Unsatisfactory cytologic smear of cervix: Secondary | ICD-10-CM

## 2014-01-02 DIAGNOSIS — J019 Acute sinusitis, unspecified: Secondary | ICD-10-CM

## 2014-01-02 LAB — IRON: Iron: 32 ug/dL — ABNORMAL LOW (ref 42–145)

## 2014-01-02 LAB — POCT CBC
Granulocyte percent: 54.8 %G (ref 37–80)
HCT, POC: 35.2 % — AB (ref 37.7–47.9)
Hemoglobin: 10.7 g/dL — AB (ref 12.2–16.2)
Lymph, poc: 2 (ref 0.6–3.4)
MCH, POC: 25.8 pg — AB (ref 27–31.2)
MCHC: 30.4 g/dL — AB (ref 31.8–35.4)
MCV: 84.9 fL (ref 80–97)
MID (cbc): 0.5 (ref 0–0.9)
MPV: 8 fL (ref 0–99.8)
POC Granulocyte: 3.1 (ref 2–6.9)
POC LYMPH PERCENT: 3635 %L — AB (ref 10–50)
POC MID %: 8.7 %M (ref 0–12)
Platelet Count, POC: 505 10*3/uL — AB (ref 142–424)
RBC: 4.15 M/uL (ref 4.04–5.48)
RDW, POC: 15.3 %
WBC: 5.6 10*3/uL (ref 4.6–10.2)

## 2014-01-02 LAB — COMPREHENSIVE METABOLIC PANEL
ALT: 13 U/L (ref 0–35)
AST: 17 U/L (ref 0–37)
Albumin: 4.1 g/dL (ref 3.5–5.2)
Alkaline Phosphatase: 73 U/L (ref 39–117)
BUN: 14 mg/dL (ref 6–23)
CO2: 25 mEq/L (ref 19–32)
Calcium: 9.6 mg/dL (ref 8.4–10.5)
Chloride: 102 mEq/L (ref 96–112)
Creat: 0.64 mg/dL (ref 0.50–1.10)
Glucose, Bld: 86 mg/dL (ref 70–99)
Potassium: 4.4 mEq/L (ref 3.5–5.3)
Sodium: 140 mEq/L (ref 135–145)
Total Bilirubin: 0.3 mg/dL (ref 0.3–1.2)
Total Protein: 7.4 g/dL (ref 6.0–8.3)

## 2014-01-02 MED ORDER — HYDROCOD POLST-CHLORPHEN POLST 10-8 MG/5ML PO LQCR: 5.0000 mL | Freq: Two times a day (BID) | ORAL | Status: DC | PRN

## 2014-01-02 MED ORDER — FLUOXETINE HCL 20 MG PO TABS: 60.0000 mg | ORAL_TABLET | Freq: Every day | ORAL | Status: DC

## 2014-01-02 MED ORDER — DESOGESTREL-ETHINYL ESTRADIOL 0.15-30 MG-MCG PO TABS: 1.0000 | ORAL_TABLET | Freq: Every day | ORAL | Status: DC

## 2014-01-02 MED ORDER — DOXYCYCLINE HYCLATE 100 MG PO CAPS: 100.0000 mg | ORAL_CAPSULE | Freq: Two times a day (BID) | ORAL | Status: DC

## 2014-01-02 NOTE — Progress Notes (Addendum)
Subjective:    Patient ID: Kerry Martinez, female    DOB: November 07, 1986, 28 y.o.   MRN: [id number]  HPI  Chief Complaint  Patient presents with  . Cough    follow up   This chart was scribed for [employer]. Tamala Julian, MD, by Sydell Axon, ED Scribe. This patient was seen in room 02 and the patient's care was started at 3:52 PM.  HPI Comments: Kerry Martinez is a 28 y.o. Female with a history of Crohn's disease and Asthma who presents to the [facility] complaining of a progressively worsening cough following her most recent visit to [facility] two weeks ago, on 12/19/2013, where she was seen by [name], PA-C. During her last visit, she was diagnosed with Influenza type A and was asked to follow up with her regular PCP. Patient states her last fever occurred on 12/21/2013 and denies any chills or hot flashes since then; however, she awoke yesterday morning with a worsening of her cough and congestion. Her cough is non-productive, however she feels some congestion built up in her chest.  Additionally, she states having thick, clear and green rhinorrhea. Her SOB has resolved with taking albuterol every four hours since her last visit. Patient states she has had a constant HA that has not resolved and progressively worsening otalgia. She has taken Ciprodex without relief. Overall, patient states she is feeling 65% better overall, however; she is concerned about her worsening symptoms. She has completed taking herTamiflu medication.  Patient started seeing a GI specialist in [place] after experiencing hyperactive BMs, increased pain, and fatigue following temporary relief with medication. Her specialist ordered a colonoscopy and endoscopy with negative findings. She is currently taking Humira and is planning to switch to Remicaid as per her specialist's advice. She has plans to return to [place] at the end of the month to follow up with her GI specialist. Patient was  given cholesterol mediation/Questran? at [facility] to help slow her BMs with no relief of symptoms.   Patient also has plans to have a liver MRI in the coming weeks to check her a liver lesion. She recently had a [place] of her gallbladder with negative findings.  Depression:  Two month follow-up; no changes to management made at last visit; getting very frustrated with chronic pain and diarrhea associated with Crohn's; also does not feel well.  All studies are returning normal; "I feel crazy but I am not making up the pain nor the diarrhea and fatigue".  Compliance with Prozac 58m two tablets daily. Has not been receiving counseling; planned on meeting with therapist over the holidays but no times available; considering establishing with  [place]  [place] SI.   Interested in increased dose of Prozac.  Obesity: weight down since leaving for school in 07/2013; doing well with weight.  Past Medical History  Diagnosis Date  . Asthma   . Crohn's disease   . Polycystic ovarian syndrome 12/27/2010    s/p endocrinology consult/[facility].  . Migraine   . Allergy   . Depression   . Anemia     Past Surgical History  Procedure Laterality Date  . Tonsillectomy  1997  . Tympanostomy tube placement  1990  . Mastoidectomy  1991  . Ear graph  2004  . Lap band procedure  04/06/2011  . [facility]  [facility]    3 gastric ulcers,  hiatal hernia.  [facility].  . Admission  11/10/12    coffee ground emesis, abdominal pain.  EGD with 3 gastric ulcers and HH.  s/p lab band reversal.  [facility].  GI consults, Bariatric Surgery Consult.  . Colonoscopy w/ biopsies    . Laparoscopic gastric sleeve resection  03/27/2013  . Partial colectomy  12/26/12    Crohn's disease resection; [place]. [facility]    Family History  Problem Relation Age of Onset  . Diabetes Father   . Hypertension Father   . Hyperlipidemia Father   . Other Sister      fibromyalgia  . Fibromyalgia Sister   . Cancer Mother     precancerous polyps in colon  . Asthma Mother   . Depression Mother   . Diabetes Mother   . Hyperlipidemia Mother   . Hypertension Mother   . Diabetes Maternal Grandmother   . ALS Maternal Grandmother   . Heart disease Maternal Grandfather   . Kidney disease Maternal Grandfather   . Diabetes Maternal Grandfather   . Diabetes Paternal Grandmother   . Diabetes Paternal Grandfather   . Parkinson's disease Paternal Grandfather     History   Social History  . Marital Status: Single    Spouse Name: N/A    Number of Children: N/A  . Years of Education: N/A   Occupational History  . Not on file.   Social History Main Topics  . Smoking status: Never Smoker   . Smokeless tobacco: Never Used  . Alcohol Use: Yes     Comment: socially  . Drug Use: No  . Sexual Activity: Not Currently   Other Topics Concern  . Not on file   Social History Narrative   Marital status: single; not dating.      Children: none      Lives: with parents.      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      Education: [employer] in fall 2014.      Tobacco: none      Alcohol: socially; rarely.      Drugs :none      Exercise: sporadic exercise due to Crohn's disease.  Walking three times per week.      Sexual activity: one partner.  Last sexual activity 2 years ago.  No STDs.      Seatbelt:  100% of time.      Sunscreen: SPF 61.    Allergies  Allergen Reactions  . Amoxicillin Hives  . Cefprozil Hives  . Cephalosporins Hives and Itching  . Multihance [Gadobenate]     Patient Active Problem List   Diagnosis Date Noted  . Anemia, iron deficiency 06/25/2014  . Insomnia 06/25/2014  . H/O surgical procedure 07/13/2013  . Anemia, unspecified 01/29/2013  . Vitamin B12 deficiency 01/29/2013  . Folate deficiency 01/29/2013  . Unspecified vitamin D deficiency 01/29/2013  . Peptic  ulcer disease 11/27/2012  . Abdominal pain 11/27/2012  . Coffee ground emesis 11/27/2012  . Depression 11/27/2012  . Nausea and vomiting 11/27/2012  . Need for prophylactic vaccination against Streptococcus pneumoniae (pneumococcus) 11/27/2012  . Crohn's disease 09/20/2012  . Extreme obesity 08/07/2012  . Bilateral polycystic ovarian syndrome 08/07/2012  . 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 MRSA (methicillin resistant staph aureus) culture positive 06/25/2011  . Asthma 06/25/2011  . Migraines 06/25/2011  . Family history of breast cancer 06/25/2011    Results for orders placed or performed  in visit on 01/02/14  Vitamin B12  Result Value Ref Range   Vitamin B-12 1148 (H) 211 - 911 pg/mL  Vitamin D, 25-hydroxy  Result Value Ref Range   Vit D, 25-Hydroxy 43 30 - 89 ng/mL  Iron  Result Value Ref Range   Iron 32 (L) 42 - 145 ug/dL  Folate  Result Value Ref Range   Folate 14.0 ng/mL  Comprehensive metabolic panel  Result Value Ref Range   Sodium 140 135 - 145 mEq/L   Potassium 4.4 3.5 - 5.3 mEq/L   Chloride 102 96 - 112 mEq/L   CO2 25 19 - 32 mEq/L   Glucose, Bld 86 70 - 99 mg/dL   BUN 14 6 - 23 mg/dL   Creat 0.64 0.50 - 1.10 mg/dL   Total Bilirubin 0.3 0.3 - 1.2 mg/dL   Alkaline Phosphatase 73 39 - 117 U/L   AST 17 0 - 37 U/L   ALT 13 0 - 35 U/L   Total Protein 7.4 6.0 - 8.3 g/dL   Albumin 4.1 3.5 - 5.2 g/dL   Calcium 9.6 8.4 - 10.5 mg/dL  POCT CBC  Result Value Ref Range   WBC 5.6 4.6 - 10.2 K/uL   Lymph, poc 2.0 0.6 - 3.4   POC LYMPH PERCENT 3635 (A) 10 - 50 %L   MID (cbc) 0.5 0 - 0.9   POC MID % 8.7 0 - 12 %M   POC Granulocyte 3.1 2 - 6.9   Granulocyte percent 54.8 37 - 80 %G   RBC 4.15 4.04 - 5.48 M/uL   Hemoglobin 10.7 (A) 12.2 - 16.2 g/dL   HCT, POC 35.2 (A) 37.7 - 47.9 %   MCV 84.9 80 - 97 fL   MCH, POC 25.8 (A) 27 - 31.2 pg   MCHC 30.4 (A) 31.8 - 35.4 g/dL   RDW, POC 15.3 %   Platelet Count, POC 505 (A) 142 - 424 K/uL   MPV 8.0 0 - 99.8 fL    1.  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 Unspecified vitamin D deficiency   3. Vitamin B12 deficiency   4. Folate deficiency   5. Crohn's disease   6. Depression   7. Encounter for repeat Pap smear due to previous insuff cervical cells   8. Influenza A   9. Acute sinusitis     Current Outpatient Prescriptions on File Prior to Visit  Medication Sig Dispense Refill  . Adalimumab (HUMIRA) 20 MG/0.4ML KIT Inject 20 mg into the skin every 7 (seven) days.     Marland Kitchen albuterol (PROVENTIL HFA;VENTOLIN HFA) 108 (90 BASE) MCG/ACT inhaler Inhale 2 puffs into the lungs every 6 (six) hours as needed. For shortness of breath. 1 Inhaler 5  . albuterol (PROVENTIL) (2.5 MG/3ML) 0.083% nebulizer solution Take 3 mLs (2.5 mg total) by nebulization every 6 (six) hours as needed for wheezing or shortness of breath. 150 mL 1  . budesonide (PULMICORT) 180 MCG/ACT inhaler Inhale 2 puffs into the lungs 2 (two) times daily. For shortness of breath. 1 Inhaler 5  . Calcium Carbonate-Vitamin D (CALCIUM-VITAMIN D) 500-200 MG-UNIT per tablet Take 1 tablet by mouth 3 (three) times daily.    . cholecalciferol (VITAMIN D) 1000 UNITS tablet Take 1,000 Units by mouth daily.    . Multiple Vitamins-Minerals (MULTIVITAMINS THER. W/MINERALS) TABS Take 1 tablet by mouth daily.     No current facility-administered medications on file prior to visit.    BP 130/70 mmHg  Pulse 101  Temp(Src) 98.7 F (37.1 C) (Oral)  Resp 16  Ht 5' 6.5" (1.689 m)  Wt 264 lb (119.75 kg)  BMI 41.98 kg/m2  SpO2 97%  LMP 11/24/2013   Review of Systems  Constitutional: Positive for fatigue. Negative for fever, chills, diaphoresis, activity change and appetite change.  HENT: Positive for congestion, ear pain, rhinorrhea and sore throat. Negative for voice change.   Respiratory: Positive for cough. Negative for chest tightness and shortness of breath.   Cardiovascular: Positive for chest pain.  Gastrointestinal: Positive for abdominal pain. Negative for nausea  and vomiting.  Musculoskeletal: Negative.   Skin: Negative.   Neurological: Positive for headaches. Negative for dizziness and weakness.  Psychiatric/Behavioral: Positive for dysphoric mood. Negative for suicidal ideas, sleep disturbance and self-injury. The patient is not nervous/anxious.   All other systems reviewed and are negative.   Objective:  Physical Exam  Nursing note and vitals reviewed. Constitutional: She is oriented to person, place, and time. She appears well-developed and well-nourished. No distress.  HENT:  Head: Normocephalic and atraumatic.  Right Ear: Hearing and tympanic membrane normal.  Left Ear: Hearing and tympanic membrane normal.  Mouth/Throat: No posterior oropharyngeal erythema.  Drainage  Eyes: EOM are normal. Pupils are equal, round, and reactive to light.  Neck: Neck supple. No thyromegaly present.  Cardiovascular: Normal rate, regular rhythm and normal heart sounds.   Pulmonary/Chest: Effort normal and breath sounds normal. No respiratory distress. She has no wheezes. She has no rhonchi. She has no rales.  Musculoskeletal: Normal range of motion.  Lymphadenopathy:    She has no cervical adenopathy.  Neurological: She is alert and oriented to person, place, and time.  Skin: Skin is warm and dry.  Psychiatric: She has a normal mood and affect. Her behavior is normal.   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 - Plan: POCT CBC, Vitamin B12, Vitamin D, 25-hydroxy, Iron, Folate, Comprehensive metabolic panel  Unspecified vitamin D deficiency - Plan: POCT CBC, Vitamin B12, Vitamin D, 25-hydroxy, Iron, Folate, Comprehensive metabolic panel  Vitamin R41 deficiency - Plan: POCT CBC, Vitamin B12, Vitamin D, 25-hydroxy, Iron, Folate, Comprehensive metabolic panel  Folate deficiency - Plan: POCT CBC, Vitamin B12, Vitamin D, 25-hydroxy, Iron, Folate, Comprehensive metabolic panel  Crohn's disease - Plan: POCT CBC, Vitamin B12, Vitamin D, 25-hydroxy, Iron,  Folate, Comprehensive metabolic panel  Depression - Plan: POCT CBC, Vitamin B12, Vitamin D, 25-hydroxy, Iron, Folate, Comprehensive metabolic panel, FLUoxetine (PROZAC) 20 MG tablet, DISCONTINUED: FLUoxetine (PROZAC) 20 MG tablet  Encounter for repeat Pap smear due to previous insuff cervical cells - Plan: Ambulatory referral to Gynecology  Influenza A  Acute sinusitis  1.  Obesity s/p Lapband in 03/2011: weight continues to improve.  Continue to work on weight loss and exercise. 2.  Vitamin D deficiency: stable; obtain labs. 3.  Vitamin B12 deficiency: stable; obtain labs. 4.  Folate deficiency: stable; obtain labs. 5.  Crohn's disease: recent worsening; has established with GI in [place]; s/p recent EGD and colonoscopy.  Plan to switch to Remicade. 6.  Depression: worsening lately due to stressors; agree with increasing Prozac to 75m daily; highly encourage [place]  7.  Pap smear with insufficient cells: refer to gynecology. 8. Influenza A: improving; s/p Tamiflu.   9. Acute maxillary sinusitis:  New. Rx for Doxycycline provided.  Refill of Tussionex provided.    Meds ordered this encounter  Medications  . DISCONTD: FLUoxetine (PROZAC) 20 MG tablet    Sig: Take 3  tablets (60 mg total) by mouth daily.    Dispense:  90 tablet    Refill:  11  . FLUoxetine (PROZAC) 20 MG tablet    Sig: Take 3 tablets (60 mg total) by mouth daily.    Dispense:  90 tablet    Refill:  11  . DISCONTD: desogestrel-ethinyl estradiol (APRI,EMOQUETTE,SOLIA) 0.15-30 MG-MCG tablet    Sig: Take 1 tablet by mouth daily.    Dispense:  1 Package    Refill:  11  . DISCONTD: doxycycline (VIBRAMYCIN) 100 MG capsule    Sig: Take 1 capsule (100 mg total) by mouth 2 (two) times daily.    Dispense:  20 capsule    Refill:  0  . DISCONTD: chlorpheniramine-HYDROcodone (TUSSIONEX) 10-8 MG/5ML LQCR    Sig: Take 5 mLs by mouth every 12 (twelve) hours as needed for cough.    Dispense:  180  mL    Refill:  0   I personally performed the services described in this documentation, which was scribed in my presence.  The recorded information has been reviewed and is accurate.  Reginia Forts, M.D.  [facility] [address] [place], [place]  [address] [phone] phone [phone] fax

## 2014-01-03 LAB — VITAMIN D 25 HYDROXY (VIT D DEFICIENCY, FRACTURES): Vit D, 25-Hydroxy: 43 ng/mL (ref 30–89)

## 2014-01-03 LAB — FOLATE: Folate: 14 ng/mL

## 2014-01-03 LAB — VITAMIN B12: Vitamin B-12: 1148 pg/mL — ABNORMAL HIGH (ref 211–911)

## 2014-01-06 ENCOUNTER — Encounter: Payer: Self-pay | Admitting: Family Medicine

## 2014-06-24 ENCOUNTER — Ambulatory Visit (INDEPENDENT_AMBULATORY_CARE_PROVIDER_SITE_OTHER): Payer: BC Managed Care – PPO | Admitting: Family Medicine

## 2014-06-24 ENCOUNTER — Encounter: Payer: Self-pay | Admitting: Family Medicine

## 2014-06-24 VITALS — BP 114/72 | HR 74 | Temp 98.0°F | Resp 16 | Wt 278.0 lb

## 2014-06-24 DIAGNOSIS — H6690 Otitis media, unspecified, unspecified ear: Secondary | ICD-10-CM

## 2014-06-24 DIAGNOSIS — Z9884 Bariatric surgery status: Secondary | ICD-10-CM

## 2014-06-24 DIAGNOSIS — H6691 Otitis media, unspecified, right ear: Secondary | ICD-10-CM

## 2014-06-24 DIAGNOSIS — E538 Deficiency of other specified B group vitamins: Secondary | ICD-10-CM

## 2014-06-24 DIAGNOSIS — F329 Major depressive disorder, single episode, unspecified: Secondary | ICD-10-CM

## 2014-06-24 DIAGNOSIS — F32A Depression, unspecified: Secondary | ICD-10-CM

## 2014-06-24 DIAGNOSIS — K50919 Crohn's disease, unspecified, with unspecified complications: Secondary | ICD-10-CM

## 2014-06-24 DIAGNOSIS — F3289 Other specified depressive episodes: Secondary | ICD-10-CM

## 2014-06-24 DIAGNOSIS — E559 Vitamin D deficiency, unspecified: Secondary | ICD-10-CM

## 2014-06-24 DIAGNOSIS — G4700 Insomnia, unspecified: Secondary | ICD-10-CM

## 2014-06-24 DIAGNOSIS — D509 Iron deficiency anemia, unspecified: Secondary | ICD-10-CM

## 2014-06-24 DIAGNOSIS — K5090 Crohn's disease, unspecified, without complications: Secondary | ICD-10-CM

## 2014-06-24 LAB — COMPLETE METABOLIC PANEL WITH GFR
ALT: 8 U/L (ref 0–35)
AST: 12 U/L (ref 0–37)
Albumin: 4.2 g/dL (ref 3.5–5.2)
Alkaline Phosphatase: 72 U/L (ref 39–117)
BUN: 12 mg/dL (ref 6–23)
CO2: 27 mEq/L (ref 19–32)
Calcium: 9.9 mg/dL (ref 8.4–10.5)
Chloride: 102 mEq/L (ref 96–112)
Creat: 0.73 mg/dL (ref 0.50–1.10)
GFR, Est African American: >89 mL/min
GFR, Est Non African American: >89 mL/min
Glucose, Bld: 95 mg/dL (ref 70–99)
Potassium: 4.1 mEq/L (ref 3.5–5.3)
Sodium: 138 mEq/L (ref 135–145)
Total Bilirubin: 0.3 mg/dL (ref 0.2–1.2)
Total Protein: 7.1 g/dL (ref 6.0–8.3)

## 2014-06-24 LAB — CBC
HCT: 32.8 % — ABNORMAL LOW (ref 36.0–46.0)
Hemoglobin: 10.6 g/dL — ABNORMAL LOW (ref 12.0–15.0)
MCH: 25.4 pg — ABNORMAL LOW (ref 26.0–34.0)
MCHC: 32.3 g/dL (ref 30.0–36.0)
MCV: 78.5 fL (ref 78.0–100.0)
Platelets: 467 10*3/uL — ABNORMAL HIGH (ref 150–400)
RBC: 4.18 MIL/uL (ref 3.87–5.11)
RDW: 16.3 % — ABNORMAL HIGH (ref 11.5–15.5)
WBC: 6.9 10*3/uL (ref 4.0–10.5)

## 2014-06-24 LAB — VITAMIN B12: Vitamin B-12: 1465 pg/mL — ABNORMAL HIGH (ref 211–911)

## 2014-06-24 LAB — IRON AND TIBC
%SAT: 16 % — ABNORMAL LOW (ref 20–55)
Iron: 101 ug/dL (ref 42–145)
TIBC: 628 ug/dL — ABNORMAL HIGH (ref 250–470)
UIBC: 527 ug/dL — ABNORMAL HIGH (ref 125–400)

## 2014-06-24 MED ORDER — LORATADINE-PSEUDOEPHEDRINE ER 5-120 MG PO TB12: 1.0000 | ORAL_TABLET | Freq: Two times a day (BID) | ORAL | Status: DC

## 2014-06-24 MED ORDER — IPRATROPIUM BROMIDE 0.03 % NA SOLN: 2.0000 | Freq: Two times a day (BID) | NASAL | Status: DC

## 2014-06-24 MED ORDER — LEVOFLOXACIN 750 MG PO TABS: 750.0000 mg | ORAL_TABLET | Freq: Every day | ORAL | Status: DC

## 2014-06-24 MED ORDER — TRAZODONE HCL 50 MG PO TABS: 50.0000 mg | ORAL_TABLET | Freq: Every evening | ORAL | Status: DC | PRN

## 2014-06-24 NOTE — Progress Notes (Signed)
Subjective:  This chart was scribed for Kerry Honour, MD by Ladene Artist, ED Scribe. The patient was seen in room 22. Patient's care was started at 12:01 PM.   Patient ID: Kerry Martinez, female    DOB: 09-Jan-1986, 28 y.o.   MRN: [id number]  06/24/2014  Anxiety, Nasal Congestion and Anemia  HPI HPI Comments: SPENSER Martinez is a 28 y.o. female who presents to the [facility] for 6 month follow-up for depression with anxiety, Crohn's disease, and obesity status post gastric sleeve. Her weight is up 14 lbs since January 2015. Management at last visit included increasing Prozac to 60 mg daily. She has a h/o iron deficiency anemia that had worsened last visit. Pt's grandfather also recently passed 04/22/14.   Pt states that she feels her ear drum burst last Saturday. Pt states that she woke up with congestion 4-5 days ago. She reports associated mild chills, R ear drainage, R ear pain, sore throat, postnasal drip, cough. She denies fever, SOB, wheezing. Has not taken any medication for symptoms. She has taken cough drops. She did start an ear drop prescribed by [name] yesterday.  Pt has an appointment this afternoon with [name].  History of recurrent ear infections.  Pt reports staph infection in scalp earlier in June. Pt was placed on doxycycline for 10 days by dermatologist, finished 1.5 weeks ago. Infection has improved. Next appointment in July with dermatology.   Pt states that her first year of school was great and her grades were decent. She reports normal graduate school stressors. Pt has 3 more years with dual degree program. She reports a good support system at school. Pt has an apartment and lives on her own but has friends down the hall. Pt is home for the summer until August and spending time with her grandmother since the recent death of grandfather. She states that she is glad to be back here. She states that it has been a tough 6 months but states  that it has been solid. She states that she has more good days than bad days. She denies SI. Pt last saw therapist last week. She sees her every 2-3 weeks by phone when she's in [place].   Pt states that she is in remission for Crohn's. She is still on Humira due to normal colonoscopy and endoscopy. She reports 4-5 BMs daily, some loose. She also reports associated daily abdominal pain. She denies hemoptysis. She was not switched to Remicade due to normal EGD and colonoscopy in 12/2013.  Pt has switched to [facility] with iron in it in 02/2014 after gynecology appointment with Dr. [name] in [place]; s/p successful pap smear that was normal. She reports mild spotting. Saw GYN in March and again in the beginning of June with normal pap smear.  Having very minimal periods on Junel-Fe.  Review of Systems  Constitutional: Positive for chills. Negative for fever, diaphoresis and fatigue.  HENT: Positive for congestion, ear discharge, ear pain, postnasal drip, rhinorrhea and sore throat.   Respiratory: Positive for cough. Negative for shortness of breath and wheezing.   Cardiovascular: Negative for chest pain, palpitations and leg swelling.  Gastrointestinal: Positive for abdominal pain. Negative for nausea, vomiting, diarrhea and constipation.  Endocrine: Negative for polydipsia, polyphagia and polyuria.  Neurological: Negative for tremors, seizures, syncope, speech difficulty, weakness, light-headedness, numbness and headaches.  Psychiatric/Behavioral: Positive for sleep disturbance and dysphoric mood. Negative for suicidal ideas. The patient is nervous/anxious.  Past Medical History  Diagnosis Date  . Asthma   . Crohn's disease   . Polycystic ovarian syndrome 12/27/2010    s/p endocrinology consult/[facility].  . Migraine   . Allergy   . Depression   . Anemia    Past Surgical History  Procedure Laterality Date  . Tonsillectomy  1997  . Tympanostomy tube placement  1990  . Mastoidectomy  1991  . Ear  graph  2004  . Lap band procedure  04/06/2011  . Lap band reversal  11/12/12    [facility]  . Esophagogastroduodenoscopy  11/12/12    3 gastric ulcers, hiatal hernia.  [facility].  . Admission  11/10/12    coffee ground emesis, abdominal pain.  EGD with 3 gastric ulcers and HH.  s/p lab band reversal.  [facility].  GI consults, Bariatric Surgery Consult.  . Colonoscopy w/ biopsies    . Laparoscopic gastric sleeve resection  03/27/2013  . Partial colectomy  12/26/12    Crohn's disease resection; [facility]. [facility]    Allergies  Allergen Reactions  . Amoxicillin Hives  . Cefprozil Hives  . Cephalosporins Hives and Itching  . Multihance [Gadobenate]    Current Outpatient Prescriptions  Medication Sig Dispense Refill  . Adalimumab (HUMIRA) 20 MG/0.4ML KIT Inject 20 mg into the skin every 7 (seven) days.       Marland Kitchen albuterol (PROVENTIL HFA;VENTOLIN HFA) 108 (90 BASE) MCG/ACT inhaler Inhale 2 puffs into the lungs every 6 (six) hours as needed. For shortness of breath.  1 Inhaler  5  . albuterol (PROVENTIL) (2.5 MG/3ML) 0.083% nebulizer solution Take 3 mLs (2.5 mg total) by nebulization every 6 (six) hours as needed for wheezing or shortness of breath.  150 mL  1  . budesonide (PULMICORT) 180 MCG/ACT inhaler Inhale 2 puffs into the lungs 2 (two) times daily. For shortness of breath.  1 Inhaler  5  . Calcium Carbonate-Vitamin D (CALCIUM-VITAMIN D) 500-200 MG-UNIT per tablet Take 1 tablet by mouth 3 (three) times daily.      . cholecalciferol (VITAMIN D) 1000 UNITS tablet Take 1,000 Units by mouth daily.      Marland Kitchen FLUoxetine (PROZAC) 20 MG tablet Take 3 tablets (60 mg total) by mouth daily.  90 tablet  11  . Multiple Vitamins-Minerals (MULTIVITAMINS THER. W/MINERALS) TABS Take 1 tablet by mouth daily.      . norethindrone-ethinyl estradiol (JUNEL FE,GILDESS FE,LOESTRIN FE) 1-20 MG-MCG tablet Take 1 tablet by mouth daily.      . Pantoprazole Sodium (PROTONIX PO) Take by mouth. Taking 40 mg       . traZODone (DESYREL) 50 MG tablet Take 1-2 tablets (50-100 mg total) by mouth at bedtime as needed for sleep.  45 tablet  5  . doxycycline (VIBRAMYCIN) 100 MG capsule Take 1 capsule (100 mg total) by mouth 2 (two) times daily.  20 capsule  0  . ipratropium (ATROVENT) 0.03 % nasal spray Place 2 sprays into the nose 2 (two) times daily.  30 mL  0  . levofloxacin (LEVAQUIN) 750 MG tablet Take 1 tablet (750 mg total) by mouth daily.  10 tablet  0  . loratadine-pseudoephedrine (CLARITIN-D 12-HOUR) 5-120 MG per tablet Take 1 tablet by mouth 2 (two) times daily.  20 tablet  0   No current facility-administered medications for this visit.   History   Social History  . Marital Status: Single    Spouse Name: N/A    Number of Children: N/A  . Years of Education:  N/A   Occupational History  . Not on file.   Social History Main Topics  . Smoking status: Never Smoker   . Smokeless tobacco: Never Used  . Alcohol Use: Yes     Comment: socially  . Drug Use: No  . Sexual Activity: Not Currently   Other Topics Concern  . Not on file   Social History Narrative   Marital status: single; not dating.      Children: none      Lives: with parents.      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      Education: [employer] in fall 2014.      Tobacco: none      Alcohol: socially; rarely.      Drugs :none      Exercise: sporadic exercise due to Crohn's disease.  Walking three times per week.      Sexual activity: one partner.  Last sexual activity 2 years ago.  No STDs.      Seatbelt:  100% of time.      Sunscreen: SPF 63.   Family History  Problem Relation Age of Onset  . Diabetes Father   . Hypertension Father   . Hyperlipidemia Father   . Other Sister     fibromyalgia  . Fibromyalgia Sister   . Cancer Mother     precancerous polyps in colon  . Asthma Mother   . Depression Mother   . Diabetes Mother   . Hyperlipidemia Mother    . Hypertension Mother   . Diabetes Maternal Grandmother   . ALS Maternal Grandmother   . Heart disease Maternal Grandfather   . Kidney disease Maternal Grandfather   . Diabetes Maternal Grandfather   . Diabetes Paternal Grandmother   . Diabetes Paternal Grandfather   . Parkinson's disease Paternal Grandfather           Objective:    Triage Vitals: BP 114/72  Pulse 74  Temp(Src) 98 F (36.7 C)  Resp 16  Wt 278 lb (126.1 kg)  SpO2 98%  LMP 06/10/2014 Physical Exam  Nursing note and vitals reviewed. Constitutional: She is oriented to person, place, and time. She appears well-developed and well-nourished. No distress.  HENT:  Head: Normocephalic and atraumatic.  Right Ear: There is drainage and tenderness. No swelling. No foreign bodies. Tympanic membrane is erythematous and retracted. Tympanic membrane is not perforated and not bulging.  Left Ear: External ear normal.  Ears: R TM is dull and red External secretion in R canal External ear is tender to palpation  Head: Well-healing pustule in L posterior scalp  Eyes: Conjunctivae and EOM are normal. Pupils are equal, round, and reactive to light.  Neck: Normal range of motion. Neck supple. No thyromegaly present.  Cardiovascular: Normal rate, regular rhythm and normal heart sounds.  Exam reveals no gallop and no friction rub.   No murmur heard. Pulmonary/Chest: Effort normal and breath sounds normal. She has no wheezes. She has no rales.  Musculoskeletal: Normal range of motion.  Lymphadenopathy:    She has cervical adenopathy.  Neurological: She is alert and oriented to person, place, and time.  Skin: Skin is warm and dry. She is not diaphoretic.  Psychiatric: She has a normal mood and affect. Her behavior is normal.   Results for orders placed in visit on 06/24/14  CBC      Result Value Ref Range   WBC 6.9  4.0 -  10.5 K/uL   RBC 4.18  3.87 - 5.11 MIL/uL   Hemoglobin 10.6 (*) 12.0 - 15.0 g/dL   HCT 32.8 (*) 36.0  - 46.0 %   MCV 78.5  78.0 - 100.0 fL   MCH 25.4 (*) 26.0 - 34.0 pg   MCHC 32.3  30.0 - 36.0 g/dL   RDW 16.3 (*) 11.5 - 15.5 %   Platelets 467 (*) 150 - 400 K/uL  COMPLETE METABOLIC PANEL WITH GFR      Result Value Ref Range   Sodium 138  135 - 145 mEq/L   Potassium 4.1  3.5 - 5.3 mEq/L   Chloride 102  96 - 112 mEq/L   CO2 27  19 - 32 mEq/L   Glucose, Bld 95  70 - 99 mg/dL   BUN 12  6 - 23 mg/dL   Creat 0.73  0.50 - 1.10 mg/dL   Total Bilirubin 0.3  0.2 - 1.2 mg/dL   Alkaline Phosphatase 72  39 - 117 U/L   AST 12  0 - 37 U/L   ALT 8  0 - 35 U/L   Total Protein 7.1  6.0 - 8.3 g/dL   Albumin 4.2  3.5 - 5.2 g/dL   Calcium 9.9  8.4 - 10.5 mg/dL   GFR, Est African American >89     GFR, Est Non African American >89    IRON AND TIBC      Result Value Ref Range   Iron 101  42 - 145 ug/dL   UIBC 527 (*) 125 - 400 ug/dL   TIBC 628 (*) 250 - 470 ug/dL   %SAT 16 (*) 20 - 55 %  VITAMIN B12      Result Value Ref Range   Vitamin B-12 1465 (*) 211 - 911 pg/mL  VITAMIN D 25 HYDROXY      Result Value Ref Range   Vit D, 25-Hydroxy 51  30 - 89 ng/mL       Assessment & Plan:   1. Depression   2. Other B-complex deficiencies   3. Unspecified vitamin D deficiency   4. Regional enteritis of unspecified site   5. Lapband APL April 2012   6. Crohn's disease, unspecified complication   7. Anemia, iron deficiency   8. Insomnia   9. Acute right otitis media, recurrence not specified, unspecified otitis media type    1. Depression with anxiety: controlled; continue Prozac 78m daily; continue every other week counseling.  Good family and social support. 2.  Crohn's disease: stable; followed by GI in [place];maintained on Humira.  Obtain labs. 3.  S/p gastric sleeve for morbid obesity: stable; surgery one year ago.  Obtain labs. 4.  Anemia iron deficiency: worsening; repeat labs today; continue OCP with iron. 5.  Insomnia: controlled with Trazodone 1-2 tablets qhs. 6.  Acute otitis media R  and sinusitis maxillary:  New. Rx for Levaquin provided; to see ENT today.  Rx for Claritin-D and Atrovent nasal spray.  Meds ordered this encounter  Medications  . norethindrone-ethinyl estradiol (JUNEL FE,GILDESS FE,LOESTRIN FE) 1-20 MG-MCG tablet    Sig: Take 1 tablet by mouth daily.  .Marland Kitchenlevofloxacin (LEVAQUIN) 750 MG tablet    Sig: Take 1 tablet (750 mg total) by mouth daily.    Dispense:  10 tablet    Refill:  0  . traZODone (DESYREL) 50 MG tablet    Sig: Take 1-2 tablets (50-100 mg total) by mouth at bedtime as needed for sleep.  Dispense:  45 tablet    Refill:  5  . ipratropium (ATROVENT) 0.03 % nasal spray    Sig: Place 2 sprays into the nose 2 (two) times daily.    Dispense:  30 mL    Refill:  0  . loratadine-pseudoephedrine (CLARITIN-D 12-HOUR) 5-120 MG per tablet    Sig: Take 1 tablet by mouth 2 (two) times daily.    Dispense:  20 tablet    Refill:  0    Return in about 6 weeks (around 08/05/2014).  I personally performed the services described in this documentation, which was scribed in my presence. The recorded information has been reviewed and is accurate.  Reginia Forts, M.D.  [facility] [address] [place], [place]  [address] [phone] phone [phone] fax

## 2014-06-25 ENCOUNTER — Encounter: Payer: Self-pay | Admitting: Family Medicine

## 2014-06-25 DIAGNOSIS — D509 Iron deficiency anemia, unspecified: Secondary | ICD-10-CM | POA: Insufficient documentation

## 2014-06-25 DIAGNOSIS — G4700 Insomnia, unspecified: Secondary | ICD-10-CM | POA: Insufficient documentation

## 2014-06-25 LAB — VITAMIN D 25 HYDROXY (VIT D DEFICIENCY, FRACTURES): Vit D, 25-Hydroxy: 51 ng/mL (ref 30–89)

## 2014-06-27 ENCOUNTER — Encounter: Payer: Self-pay | Admitting: Family Medicine

## 2014-06-27 MED ORDER — PANTOPRAZOLE SODIUM 40 MG PO TBEC: 40.0000 mg | DELAYED_RELEASE_TABLET | Freq: Every day | ORAL | Status: DC

## 2014-08-05 ENCOUNTER — Ambulatory Visit (INDEPENDENT_AMBULATORY_CARE_PROVIDER_SITE_OTHER): Payer: BC Managed Care – PPO | Admitting: Family Medicine

## 2014-08-05 ENCOUNTER — Encounter: Payer: Self-pay | Admitting: Family Medicine

## 2014-08-05 VITALS — BP 135/76 | HR 84 | Temp 99.1°F | Resp 16 | Ht 65.75 in | Wt 286.2 lb

## 2014-08-05 DIAGNOSIS — G4700 Insomnia, unspecified: Secondary | ICD-10-CM

## 2014-08-05 DIAGNOSIS — D509 Iron deficiency anemia, unspecified: Secondary | ICD-10-CM

## 2014-08-05 DIAGNOSIS — F32A Depression, unspecified: Secondary | ICD-10-CM

## 2014-08-05 DIAGNOSIS — F329 Major depressive disorder, single episode, unspecified: Secondary | ICD-10-CM

## 2014-08-05 DIAGNOSIS — F3289 Other specified depressive episodes: Secondary | ICD-10-CM

## 2014-08-05 DIAGNOSIS — K5090 Crohn's disease, unspecified, without complications: Secondary | ICD-10-CM

## 2014-08-05 LAB — COMPLETE METABOLIC PANEL WITH GFR
ALT: <8 U/L (ref 0–35)
AST: 11 U/L (ref 0–37)
Albumin: 4.1 g/dL (ref 3.5–5.2)
Alkaline Phosphatase: 65 U/L (ref 39–117)
BUN: 15 mg/dL (ref 6–23)
CO2: 26 mEq/L (ref 19–32)
Calcium: 9.6 mg/dL (ref 8.4–10.5)
Chloride: 103 mEq/L (ref 96–112)
Creat: 0.73 mg/dL (ref 0.50–1.10)
GFR, Est African American: >89 mL/min
GFR, Est Non African American: >89 mL/min
Glucose, Bld: 101 mg/dL — ABNORMAL HIGH (ref 70–99)
Potassium: 4.6 mEq/L (ref 3.5–5.3)
Sodium: 137 mEq/L (ref 135–145)
Total Bilirubin: 0.4 mg/dL (ref 0.2–1.2)
Total Protein: 7 g/dL (ref 6.0–8.3)

## 2014-08-05 LAB — CBC WITH DIFFERENTIAL/PLATELET
Basophils Absolute: 0.1 10*3/uL (ref 0.0–0.1)
Basophils Relative: 3 % — ABNORMAL HIGH (ref 0–1)
Eosinophils Absolute: 0.1 10*3/uL (ref 0.0–0.7)
Eosinophils Relative: 2 % (ref 0–5)
HCT: 31.8 % — ABNORMAL LOW (ref 36.0–46.0)
Hemoglobin: 10.4 g/dL — ABNORMAL LOW (ref 12.0–15.0)
Lymphocytes Relative: 41 % (ref 12–46)
Lymphs Abs: 1.7 10*3/uL (ref 0.7–4.0)
MCH: 25.5 pg — ABNORMAL LOW (ref 26.0–34.0)
MCHC: 32.7 g/dL (ref 30.0–36.0)
MCV: 77.9 fL — ABNORMAL LOW (ref 78.0–100.0)
Monocytes Absolute: 0.2 10*3/uL (ref 0.1–1.0)
Monocytes Relative: 6 % (ref 3–12)
Neutro Abs: 2 10*3/uL (ref 1.7–7.7)
Neutrophils Relative %: 48 % (ref 43–77)
Platelets: 475 10*3/uL — ABNORMAL HIGH (ref 150–400)
RBC: 4.08 MIL/uL (ref 3.87–5.11)
RDW: 15.4 % (ref 11.5–15.5)
WBC: 4.1 10*3/uL (ref 4.0–10.5)

## 2014-08-05 LAB — IRON: Iron: 95 ug/dL (ref 42–145)

## 2014-08-05 NOTE — Progress Notes (Signed)
Subjective:  This chart was scribed for [name], MD by Thea Alken, ED Scribe. This patient was seen in room 21 and the patient's care was started at 9:36 AM.   Patient ID: Kerry Martinez, female    DOB: [birth year], 28 y.o.   MRN: [id number]  HPI Chief Complaint  Patient presents with  . Follow-up    six weekfollow up on labs before returning back to school  . Anemia  . Depression  . Crohn's Disease  . Obesity   HPI Comments: Kerry Martinez is a 28 y.o. Female with h/o crohn's disease presents to the [facility] for a 6 week f/u regarding anxiety, depression and anemia . Pt is returning to school in 2 weeks. Pt reports both grandmothers are having hip replacements which has been hard to deal with. She states one of her grandmothers wont get the surgery without her being there which she is plans on doing. Pt has a hard time with people/family depending on her. Pt is excited for school so she can back to a routine, freedom and life. Pt seeing therapist every 2 weeks and plans on skyping while pt is at school. Pt enjoys talking to her therapist. Pt reports she has both sad and anxious days throughout the week. She reports waking up with her heart racing onset 7 months. She reports palpitations have worsened within the last couple months being around her family. She reports having this about 3-4 time within the last week and last for about .5 hours. Pt denies side effect with Prozac. Pt states she is sleeping well. Pt denies SI/HI and self injury.   Pt reports low iron deficiency has gotten better.   Past Medical History  Diagnosis Date  . Asthma   . Crohn's disease   . Polycystic ovarian syndrome 12/27/2010    s/p endocrinology consult/[facility].  . Migraine   . Allergy   . Depression   . Anemia    Past Surgical History  Procedure Laterality Date  . Tonsillectomy  1997  . Tympanostomy tube placement  1990  . Mastoidectomy  1991  . Ear graph  2004  . Lap  band procedure  04/06/2011  . Lap band reversal  11/12/12    [facility]  . Esophagogastroduodenoscopy  11/12/12    3 gastric ulcers, hiatal hernia.  [facility].  . Admission  11/10/12    coffee ground emesis, abdominal pain.  EGD with 3 gastric ulcers and HH.  s/p lab band reversal.  [facility].  GI consults, Bariatric Surgery Consult.  . Colonoscopy w/ biopsies    . Laparoscopic gastric sleeve resection  03/27/2013  . Partial colectomy  12/26/12    Crohn's disease resection; [facility]. [place]   Prior to Admission medications   Medication Sig Start Date End Date Taking? Authorizing Provider  Adalimumab (HUMIRA) 20 MG/0.4ML KIT Inject 20 mg into the skin every 7 (seven) days.    Yes Historical Provider, MD  albuterol (PROVENTIL HFA;VENTOLIN HFA) 108 (90 BASE) MCG/ACT inhaler Inhale 2 puffs into the lungs every 6 (six) hours as needed. For shortness of breath. 12/19/13  Yes Eleanore E Elana Alm, PA-C  albuterol (PROVENTIL) (2.5 MG/3ML) 0.083% nebulizer solution Take 3 mLs (2.5 mg total) by nebulization every 6 (six) hours as needed for wheezing or shortness of breath. 12/19/13  Yes Eleanore E Egan, PA-C  budesonide (PULMICORT) 180 MCG/ACT inhaler Inhale 2 puffs into the lungs 2 (two) times daily. For shortness of breath.  12/19/13  Yes Eleanore E Elana Alm, PA-C  Calcium Carbonate-Vitamin D (CALCIUM-VITAMIN D) 500-200 MG-UNIT per tablet Take 1 tablet by mouth 3 (three) times daily.   Yes Historical Provider, MD  cholecalciferol (VITAMIN D) 1000 UNITS tablet Take 1,000 Units by mouth daily.   Yes Historical Provider, MD  FLUoxetine (PROZAC) 20 MG tablet Take 3 tablets (60 mg total) by mouth daily. 01/02/14  Yes [name], MD  ipratropium (ATROVENT) 0.03 % nasal spray Place 2 sprays into the nose 2 (two) times daily. 06/24/14  Yes [name], MD  loratadine-pseudoephedrine (CLARITIN-D 12-HOUR) 5-120 MG per tablet Take 1 tablet by mouth 2 (two) times daily. 06/24/14  Yes [name], MD    Multiple Vitamins-Minerals (MULTIVITAMINS THER. W/MINERALS) TABS Take 1 tablet by mouth daily.   Yes Historical Provider, MD  norethindrone-ethinyl estradiol (JUNEL FE,GILDESS FE,LOESTRIN FE) 1-20 MG-MCG tablet Take 1 tablet by mouth daily.   Yes Historical Provider, MD  pantoprazole (PROTONIX) 40 MG tablet Take 1 tablet (40 mg total) by mouth daily. 06/27/14  Yes [name], MD  traZODone (DESYREL) 50 MG tablet Take 1-2 tablets (50-100 mg total) by mouth at bedtime as needed for sleep. 06/24/14  Yes [name], MD  doxycycline (VIBRAMYCIN) 100 MG capsule Take 1 capsule (100 mg total) by mouth 2 (two) times daily. 01/02/14   [name], MD  levofloxacin (LEVAQUIN) 750 MG tablet Take 1 tablet (750 mg total) by mouth daily. 06/24/14   [name], MD   Review of Systems  Constitutional: Positive for fatigue. Negative for fever, chills and diaphoresis.  Respiratory: Negative for cough and shortness of breath.   Cardiovascular: Positive for palpitations. Negative for chest pain and leg swelling.  Gastrointestinal: Positive for abdominal pain and diarrhea. Negative for nausea, vomiting, constipation and abdominal distention.  Endocrine: Negative for cold intolerance, heat intolerance, polydipsia, polyphagia and polyuria.  Neurological: Negative for dizziness, light-headedness and headaches.  Psychiatric/Behavioral: Positive for dysphoric mood. Negative for suicidal ideas, sleep disturbance, self-injury and decreased concentration. The patient is nervous/anxious.     Objective:   Physical Exam  Nursing note and vitals reviewed. Constitutional: She is oriented to person, place, and time. She appears well-developed and well-nourished. No distress.  HENT:  Head: Normocephalic and atraumatic.  Right Ear: External ear normal.  Left Ear: External ear normal.  Nose: Nose normal.  Mouth/Throat: Oropharynx is clear and moist.  Eyes: Conjunctivae and EOM are normal. Pupils are equal, round, and  reactive to light.  Neck: Normal range of motion. Neck supple. Carotid bruit is not present. No thyromegaly present.  Cardiovascular: Normal rate, regular rhythm, normal heart sounds and intact distal pulses.  Exam reveals no gallop and no friction rub.   No murmur heard. Pulmonary/Chest: Effort normal and breath sounds normal. No respiratory distress. She has no wheezes. She has no rales. She exhibits no tenderness.  Musculoskeletal: Normal range of motion.  Lymphadenopathy:    She has no cervical adenopathy.  Neurological: She is alert and oriented to person, place, and time. No cranial nerve deficit.  Skin: Skin is warm and dry. No rash noted. She is not diaphoretic. No erythema. No pallor.  Psychiatric: She has a normal mood and affect. Her behavior is normal. Judgment and thought content normal.   Filed Vitals:   08/05/14 0928  BP: 135/76  Pulse: 84  Temp: 99.1 F (37.3 C)  Resp: 16    Assessment & Plan:  Crohn's disease, without complications - Plan:  CBC with Differential, COMPLETE METABOLIC PANEL WITH GFR, Iron  Iron deficiency anemia, unspecified - Plan: CBC with Differential, COMPLETE METABOLIC PANEL WITH GFR, Iron  Insomnia  Depression  Anemia, iron deficiency  1. Anxiety and depression: moderately controlled; no changes to therapy at this time; continue with therapy even with return to school. Follow-up in 4-6 months when on break from graduate school.  RTC sooner for acute decline. 2.  Insomnia: controlled with Trazodone qhs. 3.  Anemia iron deficiency: stable; repeat labs today. 4. Crohn's disease: stable; suffers with ongoing abdominal pain. 5.  Obesity: stable; s/p gastric sleeve.  Weight is up over the summer months.  No orders of the defined types were placed in this encounter.    I personally performed the services described in this documentation, which was scribed in my presence.  The recorded information has been reviewed and is accurate.   Reginia Forts, M.D.  [facility] [address] [place], [place]  [address] [phone] phone [phone] fax

## 2014-10-30 ENCOUNTER — Encounter: Payer: Self-pay | Admitting: Family Medicine

## 2014-11-05 ENCOUNTER — Telehealth: Payer: Self-pay

## 2014-11-05 NOTE — Telephone Encounter (Signed)
Left message on Kerry Martinez's voicemail.

## 2014-11-05 NOTE — Telephone Encounter (Signed)
Dr. Katrinka BlazingSmith, Hal Neerebecca Austin called from [facility] regarding this pt. Requests call back at [phone]. Thanks

## 2014-11-25 ENCOUNTER — Telehealth: Payer: Self-pay | Admitting: Family Medicine

## 2014-11-25 NOTE — Telephone Encounter (Signed)
[name], (mother) wanted you to know that the Prozac isnt working for [employer] and she wants to know if meds need to be changed? Kerry Martinez's next appointment you is Dec. 28, but mother would like to speak with you before then. She feels that the therapist isnt moving quick enough to get notes to you and is anxious and worried about her daughter.

## 2014-11-26 MED ORDER — BUPROPION HCL ER (XL) 150 MG PO TB24: 150.0000 mg | ORAL_TABLET | Freq: Every day | ORAL | Status: DC

## 2014-11-26 NOTE — Telephone Encounter (Signed)
Spoke with patient --- recommended adding Wellbutrin XL 150mg  daily.  She will pick up on Thursday when she returns to [place].

## 2014-11-26 NOTE — Telephone Encounter (Signed)
Spoke with Kerry Martinez --- new SSRI --- Zoloft or adding Wellbutrin. After discuss, agreed to add Wellbutrin to Prozac. If no improvement with Wellbutrin, will continue Wellbutrin and switch Prozac to Zoloft. Pt has follow-up with [name] on 11/29/14.

## 2014-11-26 NOTE — Addendum Note (Signed)
Addended by: [name] on: 11/26/2014 10:18 AM   Modules accepted: Orders

## 2014-11-26 NOTE — Telephone Encounter (Signed)
Spoke with therapist, Hal Neer, and patient; Wellbutrin XL 150mg  daily added.

## 2014-11-26 NOTE — Telephone Encounter (Signed)
Left message with Hal Neer again asking for return call.

## 2014-12-09 ENCOUNTER — Telehealth: Payer: Self-pay

## 2014-12-09 NOTE — Telephone Encounter (Signed)
LMVM reminding patient to have her flu shot. 

## 2014-12-16 ENCOUNTER — Ambulatory Visit: Payer: BC Managed Care – PPO | Admitting: Family Medicine

## 2014-12-23 ENCOUNTER — Ambulatory Visit: Payer: BC Managed Care – PPO | Admitting: Family Medicine

## 2014-12-23 ENCOUNTER — Ambulatory Visit (INDEPENDENT_AMBULATORY_CARE_PROVIDER_SITE_OTHER): Payer: BC Managed Care – PPO | Admitting: Family Medicine

## 2014-12-23 VITALS — BP 128/87 | HR 77 | Temp 99.3°F | Resp 18 | Wt 302.0 lb

## 2014-12-23 DIAGNOSIS — D509 Iron deficiency anemia, unspecified: Secondary | ICD-10-CM

## 2014-12-23 DIAGNOSIS — J0100 Acute maxillary sinusitis, unspecified: Secondary | ICD-10-CM

## 2014-12-23 DIAGNOSIS — F329 Major depressive disorder, single episode, unspecified: Secondary | ICD-10-CM

## 2014-12-23 DIAGNOSIS — G4700 Insomnia, unspecified: Secondary | ICD-10-CM

## 2014-12-23 DIAGNOSIS — F32A Depression, unspecified: Secondary | ICD-10-CM

## 2014-12-23 DIAGNOSIS — K5090 Crohn's disease, unspecified, without complications: Secondary | ICD-10-CM

## 2014-12-23 LAB — POCT CBC
Granulocyte percent: 53.4 %G (ref 37–80)
HCT, POC: 32.8 % — AB (ref 37.7–47.9)
Hemoglobin: 10.4 g/dL — AB (ref 12.2–16.2)
Lymph, poc: 3.1 (ref 0.6–3.4)
MCH, POC: 26.3 pg — AB (ref 27–31.2)
MCHC: 31.7 g/dL — AB (ref 31.8–35.4)
MCV: 83 fL (ref 80–97)
MID (cbc): 0.7 (ref 0–0.9)
MPV: 6.6 fL (ref 0–99.8)
POC Granulocyte: 4.4 (ref 2–6.9)
POC LYMPH PERCENT: 37.7 %L (ref 10–50)
POC MID %: 8.9 %M (ref 0–12)
Platelet Count, POC: 473 10*3/uL — AB (ref 142–424)
RBC: 3.95 M/uL — AB (ref 4.04–5.48)
RDW, POC: 15.3 %
WBC: 8.2 10*3/uL (ref 4.6–10.2)

## 2014-12-23 MED ORDER — DOXYCYCLINE HYCLATE 100 MG PO CAPS: 100.0000 mg | ORAL_CAPSULE | Freq: Two times a day (BID) | ORAL | Status: DC

## 2014-12-23 MED ORDER — TRAZODONE HCL 50 MG PO TABS: 50.0000 mg | ORAL_TABLET | Freq: Every evening | ORAL | Status: DC | PRN

## 2014-12-23 MED ORDER — GUAIFENESIN-CODEINE 100-10 MG/5ML PO SOLN: 5.0000 mL | Freq: Four times a day (QID) | ORAL | Status: DC | PRN

## 2014-12-23 MED ORDER — BUPROPION HCL ER (XL) 150 MG PO TB24: 150.0000 mg | ORAL_TABLET | Freq: Every day | ORAL | Status: DC

## 2014-12-23 MED ORDER — FLUOXETINE HCL 20 MG PO TABS: 40.0000 mg | ORAL_TABLET | Freq: Every day | ORAL | Status: DC

## 2014-12-23 MED ORDER — IPRATROPIUM BROMIDE 0.03 % NA SOLN: 2.0000 | Freq: Two times a day (BID) | NASAL | Status: DC

## 2014-12-23 NOTE — Progress Notes (Addendum)
Subjective:    Patient ID: Kerry Martinez, female    DOB: Jul 29, 1986, 28 y.o.   MRN: [id number] This chart was scribed for [name], MD by Leona Carry, ED Scribe. The patient was seen in Room 10. The patient's care was started at 6:17 PM.   12/23/2014  URI   HPI HPI Comments: Kerry Martinez is a 28 y.o. female who presents to [facility] complaining of congestion, rhinorrhea, and cough beginning one week ago.  She reports thick, yellow and green nasal drainage. Patient reports that the cough is worse in the mornings and at night. She states that she has taken her inhaler every 6 hours and Mucinex with no relief of symptoms. Patient also complains of associated chills, diaphoresis, HA, otalgia, and sore throat. She thinks she has had a low fever, but has not measured her temperature. Patient denies SOB.  Is scheduled for Remicade infusion in two days.  Crohn's disease:  Symptoms have improved with Remicade.  Pain has decreased and bowel movements have decreased.  Patient reports that she is averaging 3-4 bowel movements per day.  She states that her gastroenterologist  took her off of Protonix recently and started her on Zantac.  Having some breakthrough reflux on Zantac but pt had bacterial overgrowth in stomach twice and GI felt secondary to chronic PPI use. Patient is scheduled to get a Remicade infusion at [facility] this week.  She began Remicade treatments two months ago.   Lower back pain:  Patient denies any back pain today. She has a history of ruptured disc in L5 and several bulging discs. She reports that she visited an Urgent Care when this back pain first began. She reports that she did physical therapy 2 times per week for two weeks with some relief, but was referred to [facility] by her physical therapist. Patient states that she is no longer wearing a back brace or walking with a cane.   Depression:  Patient states that she is doing better  emotionally.  She states that she has been feeling better since beginning Wellbutrin one month ago. She denies any side effects. Patient reports that she has visited her therapist within the last month and that it went well.  Crying has decreased.  Denies SI/HI.  Motivation is stable.  Denies indifference.  Good family and friend support.    Review of Systems  Constitutional: Positive for chills, diaphoresis and fatigue. Negative for fever.  HENT: Positive for congestion, ear pain (right ear), rhinorrhea, sinus pressure, sore throat and voice change. Negative for ear discharge, hearing loss and trouble swallowing.   Respiratory: Positive for cough and wheezing. Negative for shortness of breath.   Gastrointestinal: Negative for nausea, vomiting, abdominal pain and diarrhea.  Musculoskeletal: Positive for myalgias. Negative for back pain.  Skin: Negative for rash.  Neurological: Positive for headaches. Negative for dizziness, tremors, seizures, syncope, speech difficulty, weakness, light-headedness and numbness.  Psychiatric/Behavioral: Positive for sleep disturbance and dysphoric mood. Negative for suicidal ideas and self-injury. The patient is not nervous/anxious.     Past Medical History  Diagnosis Date  . Asthma   . Crohn's disease   . Polycystic ovarian syndrome 12/27/2010    s/p endocrinology consult/[facility].  . Migraine   . Allergy   . Depression   . Anemia    Past Surgical History  Procedure Laterality Date  . Tonsillectomy  1997  . Tympanostomy tube placement  1990  . Mastoidectomy  1991  .  Ear graph  2004  . Lap band procedure  04/06/2011  . Lap band reversal  11/12/12    [facility]  . Esophagogastroduodenoscopy  11/12/12    3 gastric ulcers, hiatal hernia.  [facility].  . Admission  11/10/12    coffee ground emesis, abdominal pain.  EGD with 3 gastric ulcers and HH.  s/p lab band reversal.  [facility].  GI consults, Bariatric Surgery Consult.  . Colonoscopy w/  biopsies    . Laparoscopic gastric sleeve resection  03/27/2013  . Partial colectomy  12/26/12    Crohn's disease resection; [facility]. [facility]   Allergies  Allergen Reactions  . Amoxicillin Hives  . Cefprozil Hives  . Cephalosporins Hives and Itching  . Multihance [Gadobenate]    Current Outpatient Prescriptions  Medication Sig Dispense Refill  . albuterol (PROVENTIL HFA;VENTOLIN HFA) 108 (90 BASE) MCG/ACT inhaler Inhale 2 puffs into the lungs every 6 (six) hours as needed. For shortness of breath. 1 Inhaler 5  . albuterol (PROVENTIL) (2.5 MG/3ML) 0.083% nebulizer solution Take 3 mLs (2.5 mg total) by nebulization every 6 (six) hours as needed for wheezing or shortness of breath. 150 mL 1  . budesonide (PULMICORT) 180 MCG/ACT inhaler Inhale 2 puffs into the lungs 2 (two) times daily. For shortness of breath. 1 Inhaler 5  . buPROPion (WELLBUTRIN XL) 150 MG 24 hr tablet Take 1 tablet (150 mg total) by mouth daily. 30 tablet 11  . Calcium Carbonate-Vitamin D (CALCIUM-VITAMIN D) 500-200 MG-UNIT per tablet Take 1 tablet by mouth 3 (three) times daily.    . cholecalciferol (VITAMIN D) 1000 UNITS tablet Take 1,000 Units by mouth daily.    Marland Kitchen FLUoxetine (PROZAC) 20 MG tablet Take 2 tablets (40 mg total) by mouth daily. 60 tablet 11  . inFLIXimab (REMICADE) 100 MG injection Inject into the vein.    Marland Kitchen ipratropium (ATROVENT) 0.03 % nasal spray Place 2 sprays into the nose 2 (two) times daily. 30 mL 0  . loratadine-pseudoephedrine (CLARITIN-D 12-HOUR) 5-120 MG per tablet Take 1 tablet by mouth 2 (two) times daily. 20 tablet 0  . Multiple Vitamins-Minerals (MULTIVITAMINS THER. W/MINERALS) TABS Take 1 tablet by mouth daily.    . norethindrone-ethinyl estradiol (JUNEL FE,GILDESS FE,LOESTRIN FE) 1-20 MG-MCG tablet Take 1 tablet by mouth daily.    . traZODone (DESYREL) 50 MG tablet Take 1-2 tablets (50-100 mg total) by mouth at bedtime as needed for sleep. 45 tablet 5  . doxycycline (VIBRAMYCIN) 100 MG capsule  Take 1 capsule (100 mg total) by mouth 2 (two) times daily. 20 capsule 0  . guaiFENesin-codeine 100-10 MG/5ML syrup Take 5 mLs by mouth every 6 (six) hours as needed for cough. 180 mL 0   No current facility-administered medications for this visit.       Objective:    Triage Vitals: BP 168/94 mmHg  Pulse 105  Temp(Src) 99.3 F (37.4 C) (Oral)  Resp 18  Wt 302 lb (136.986 kg)  SpO2 100%  LMP 12/16/2014 Physical Exam  Constitutional: She is oriented to person, place, and time. She appears well-developed and well-nourished. No distress.  HENT:  Head: Normocephalic and atraumatic.  Right Ear: External ear and ear canal normal. Tympanic membrane is retracted. Tympanic membrane is not perforated and not erythematous.  Left Ear: Tympanic membrane is not perforated, not erythematous and not retracted.  Nose: Mucosal edema and rhinorrhea present. Right sinus exhibits frontal sinus tenderness. Right sinus exhibits no maxillary sinus tenderness. Left sinus exhibits frontal sinus tenderness. Left sinus  exhibits no maxillary sinus tenderness.  Mouth/Throat: Oropharynx is clear and moist. No oropharyngeal exudate.  Frontal sinus tenderness.  Right TM retraced and dull.  Eyes: Conjunctivae and EOM are normal.  Neck: Neck supple. No tracheal deviation present.  Cardiovascular: Normal rate, regular rhythm and normal heart sounds.  Exam reveals no gallop and no friction rub.   No murmur heard. Pulmonary/Chest: Effort normal and breath sounds normal. No respiratory distress. She has no wheezes. She has no rales.  Musculoskeletal: Normal range of motion.  Neurological: She is alert and oriented to person, place, and time. No cranial nerve deficit.  Skin: Skin is warm and dry. No rash noted. She is not diaphoretic.  Psychiatric: She has a normal mood and affect. Her behavior is normal. Judgment and thought content normal.  Nursing note and vitals reviewed.  Results for orders placed or performed in  visit on 12/23/14  POCT CBC  Result Value Ref Range   WBC 8.2 4.6 - 10.2 K/uL   Lymph, poc 3.1 0.6 - 3.4   POC LYMPH PERCENT 37.7 10 - 50 %L   MID (cbc) 0.7 0 - 0.9   POC MID % 8.9 0 - 12 %M   POC Granulocyte 4.4 2 - 6.9   Granulocyte percent 53.4 37 - 80 %G   RBC 3.95 (A) 4.04 - 5.48 M/uL   Hemoglobin 10.4 (A) 12.2 - 16.2 g/dL   HCT, POC 16.1 (A) 09.6 - 47.9 %   MCV 83.0 80 - 97 fL   MCH, POC 26.3 (A) 27 - 31.2 pg   MCHC 31.7 (A) 31.8 - 35.4 g/dL   RDW, POC 04.5 %   Platelet Count, POC 473 (A) 142 - 424 K/uL   MPV 6.6 0 - 99.8 fL       Assessment & Plan:   1. Crohn's disease, without complications   2. Depression   3. Acute maxillary sinusitis, recurrence not specified   4. Anemia, iron deficiency      1. Crohn's disease: improved with Remicade.  Having less pain and less stools per day.  Obtain labs. 2.  Depression: improved with addition of Wellbutrin; decrease Prozac to 40mg  daily; continue Wellbutrin XL 150mg  daily. Continue regular psychotherapy.  Refill of Trazodone also provided for insomnia. 3.  Anemia: stable; will add ferrous sulfate 325mg  daily. 4.  Acute maxillary sinusitis:  New. Rx for Doxycycline, Atrovent nasal spray, Robitussin with codeine provided.   5.  Lower back pain: New; s/p two steroid injections for disc pathology with improvement.   Meds ordered this encounter  Medications  . inFLIXimab (REMICADE) 100 MG injection    Sig: Inject into the vein.  Marland Kitchen doxycycline (VIBRAMYCIN) 100 MG capsule    Sig: Take 1 capsule (100 mg total) by mouth 2 (two) times daily.    Dispense:  20 capsule    Refill:  0  . ipratropium (ATROVENT) 0.03 % nasal spray    Sig: Place 2 sprays into the nose 2 (two) times daily.    Dispense:  30 mL    Refill:  0  . guaiFENesin-codeine 100-10 MG/5ML syrup    Sig: Take 5 mLs by mouth every 6 (six) hours as needed for cough.    Dispense:  180 mL    Refill:  0  . DISCONTD: FLUoxetine (PROZAC) 20 MG tablet    Sig: Take 2  tablets (40 mg total) by mouth daily.    Dispense:  60 tablet    Refill:  11  .  buPROPion (WELLBUTRIN XL) 150 MG 24 hr tablet    Sig: Take 1 tablet (150 mg total) by mouth daily.    Dispense:  30 tablet    Refill:  11  . traZODone (DESYREL) 50 MG tablet    Sig: Take 1-2 tablets (50-100 mg total) by mouth at bedtime as needed for sleep.    Dispense:  45 tablet    Refill:  5  . FLUoxetine (PROZAC) 20 MG tablet    Sig: Take 2 tablets (40 mg total) by mouth daily.    Dispense:  60 tablet    Refill:  11    No Follow-up on file.   I personally performed the services described in this documentation, which was scribed in my presence. The recorded information has been reviewed and considered.   Nilda SimmerKristi Smith, M.D.  [facility]  [place] [address] [place], [place]  [id number] [phone] phone [phone] fax

## 2014-12-23 NOTE — Patient Instructions (Signed)

## 2014-12-24 LAB — COMPREHENSIVE METABOLIC PANEL
ALT: 10 U/L (ref 0–35)
AST: 10 U/L (ref 0–37)
Albumin: 3.8 g/dL (ref 3.5–5.2)
Alkaline Phosphatase: 71 U/L (ref 39–117)
BUN: 11 mg/dL (ref 6–23)
CO2: 27 mEq/L (ref 19–32)
Calcium: 9.3 mg/dL (ref 8.4–10.5)
Chloride: 103 mEq/L (ref 96–112)
Creat: 0.7 mg/dL (ref 0.50–1.10)
Glucose, Bld: 89 mg/dL (ref 70–99)
Potassium: 4.2 mEq/L (ref 3.5–5.3)
Sodium: 139 mEq/L (ref 135–145)
Total Bilirubin: 0.2 mg/dL (ref 0.2–1.2)
Total Protein: 6.8 g/dL (ref 6.0–8.3)

## 2014-12-24 LAB — VITAMIN D 25 HYDROXY (VIT D DEFICIENCY, FRACTURES): Vit D, 25-Hydroxy: 34 ng/mL (ref 30–100)

## 2014-12-24 LAB — VITAMIN B12: Vitamin B-12: 638 pg/mL (ref 211–911)

## 2014-12-24 LAB — IRON: Iron: 14 ug/dL — ABNORMAL LOW (ref 42–145)

## 2015-01-15 ENCOUNTER — Ambulatory Visit: Payer: BC Managed Care – PPO | Admitting: Family Medicine

## 2015-02-27 ENCOUNTER — Other Ambulatory Visit: Payer: Self-pay | Admitting: Family Medicine

## 2015-10-31 DIAGNOSIS — M5136 Other intervertebral disc degeneration, lumbar region: Secondary | ICD-10-CM | POA: Insufficient documentation

## 2016-07-15 DIAGNOSIS — M064 Inflammatory polyarthropathy: Secondary | ICD-10-CM | POA: Insufficient documentation

## 2016-10-02 ENCOUNTER — Ambulatory Visit (INDEPENDENT_AMBULATORY_CARE_PROVIDER_SITE_OTHER): Payer: PRIVATE HEALTH INSURANCE | Admitting: Physician Assistant

## 2016-10-02 VITALS — BP 128/80 | HR 86 | Temp 98.2°F | Resp 17 | Ht 66.5 in | Wt 331.0 lb

## 2016-10-02 DIAGNOSIS — H9201 Otalgia, right ear: Secondary | ICD-10-CM

## 2016-10-02 DIAGNOSIS — J069 Acute upper respiratory infection, unspecified: Secondary | ICD-10-CM

## 2016-10-02 MED ORDER — ALBUTEROL SULFATE HFA 108 (90 BASE) MCG/ACT IN AERS: 2.0000 | INHALATION_SPRAY | RESPIRATORY_TRACT | 1 refills | Status: DC | PRN

## 2016-10-02 MED ORDER — CIPROFLOXACIN-DEXAMETHASONE 0.3-0.1 % OT SUSP: 4.0000 [drp] | Freq: Two times a day (BID) | OTIC | 0 refills | Status: DC

## 2016-10-02 MED ORDER — DOXYCYCLINE HYCLATE 100 MG PO CAPS: 100.0000 mg | ORAL_CAPSULE | Freq: Two times a day (BID) | ORAL | 0 refills | Status: DC

## 2016-10-02 NOTE — Patient Instructions (Addendum)
Follow up with your ENT.  Take antibiotics and ear drops as prescribed.  Use OTC sudafed for congestion.  Use inhaler as needed for shortness of breath.   Otitis Externa Otitis externa is a germ infection in the outer ear. The outer ear is the area from the eardrum to the outside of the ear. Otitis externa is sometimes called "swimmer's ear." HOME CARE  Put drops in the ear as told by your doctor.  Only take medicine as told by your doctor.  If you have diabetes, your doctor may give you more directions. Follow your doctor's directions.  Keep all doctor visits as told. To avoid another infection:  Keep your ear dry. Use the corner of a towel to dry your ear after swimming or bathing.  Avoid scratching or putting things inside your ear.  Avoid swimming in lakes, dirty water, or pools that use a chemical called chlorine poorly.  You may use ear drops after swimming. Combine equal amounts of white vinegar and alcohol in a bottle. Put 3 or 4 drops in each ear. GET HELP IF:   You have a fever.  Your ear is still red, puffy (swollen), or painful after 3 days.  You still have yellowish-white fluid (pus) coming from the ear after 3 days.  Your redness, puffiness, or pain gets worse.  You have a really bad headache.  You have redness, puffiness, pain, or tenderness behind your ear. MAKE SURE YOU:   Understand these instructions.  Will watch your condition.  Will get help right away if you are not doing well or get worse.   This information is not intended to replace advice given to you by your health care provider. Make sure you discuss any questions you have with your health care provider.   Document Released: 05/31/2008 Document Revised: 01/03/2015 Document Reviewed: 12/30/2011 Elsevier Interactive Patient Education 2016 [employer].   IF you received an x-ray today, you will receive an invoice from [facility]. Please contact [facility] at  [phone] with questions or concerns regarding your invoice.   IF you received labwork today, you will receive an invoice from [employer]. Please contact [facility] at [phone] with questions or concerns regarding your invoice.   Our billing staff will not be able to assist you with questions regarding bills from these companies.  You will be contacted with the lab results as soon as they are available. The fastest way to get your results is to activate your My Chart account. Instructions are located on the last page of this paperwork. If you have not heard from [place] regarding the results in 2 weeks, please contact this office.

## 2016-10-02 NOTE — Progress Notes (Signed)
MRN: [id number] DOB: 08/28/1986  Subjective:   Kerry Martinez is a 30 y.o. female presenting for chief complaint of Ear Pain (right ) and URI . Reports 3 week history of sinus congestion. Last night developed right ear pain. Has associated bright yellow ear drainage this morning and decreased hearing in right ear.Pt has an extensive history with ear problems so her mother immediately called her ENT doctor, Dr. Dorma RussellKraus, this morning when pt was having these symptoms.  He said that the wanted us to do a culture and that he normally uses ciprodex drops and oral cipro to treat patient's symptoms.   She also has associated subjective fever, sinus headache, rhinorrhea, dry cough for three days (no hemoptysis), shortness of breath,and  fatigue. Has tried mucinex, allegra, tramadol, and aleve with mild relief. Denies  itchy watery eyes, red eyes, difficulty swallowing, wheezing, chest tightness, chest pain and myalgia, nausea, vomiting, abdominal pain and diarrhea. Has had sick contact with sister. Has history of seasonal allergies and history of asthma. Patient has not had flu shot this season. No smoking, admits to social alcohol use. Denies any other aggravating or relieving factors, no other questions or concerns.  Kerry Martinez has a current medication list which includes the following prescription(s): albuterol, budesonide, bupropion, calcium-vitamin d, cholecalciferol, ethynodiol-ethinyl estradiol, fluoxetine, folic acid, infliximab, ipratropium, methotrexate, multivitamins ther. w/minerals, and trazodone. Also is allergic to amoxicillin; cefprozil; cephalosporins; and multihance [gadobenate].  Kerry Martinez  has a past medical history of Allergy; Anemia; Asthma; Crohn's disease (HCC); Depression; Migraine; and Polycystic ovarian syndrome (12/27/2010). Also  has a past surgical history that includes Tonsillectomy (1997); Tympanostomy tube placement (1990); Mastoidectomy (1991); ear graph (2004); Lap band procedure  (04/06/2011); Lap Band Reversal (11/12/12); Esophagogastroduodenoscopy (11/12/12); Admission (11/10/12); Colonoscopy w/ biopsies; Laparoscopic gastric sleeve resection (03/27/2013); and Partial colectomy (12/26/12).  Objective:   Vitals: BP 128/80 (BP Location: Right Arm, Patient Position: Sitting, Cuff Size: Large)   Pulse 86   Temp 98.2 F (36.8 C) (Oral)   Resp 17   Ht 5' 6.5" (1.689 m)   Wt (!) 331 lb (150.1 kg)   LMP 10/01/2016 (Approximate)   SpO2 98%   BMI 52.62 kg/m   Physical Exam  Constitutional: She appears well-developed and well-nourished.  HENT:  Head: Normocephalic and atraumatic.  Right Ear: Hearing normal. There is drainage (wet, thick, yellow cerumen ), swelling (mild in ear canal ) and tenderness ( upon palpation of tragus). There is mastoid tenderness.  Left Ear: Hearing, tympanic membrane, external ear and ear canal normal.  Nose: Mucosal edema and rhinorrhea present. Right sinus exhibits no maxillary sinus tenderness and no frontal sinus tenderness. Left sinus exhibits no maxillary sinus tenderness and no frontal sinus tenderness.  Mouth/Throat: Uvula is midline and mucous membranes are normal. Posterior oropharyngeal erythema present.  Cannot visualize TM fully due to swelling and cerumen present.   Eyes: Pupils are equal, round, and reactive to light.  Cardiovascular: Normal rate, regular rhythm and normal heart sounds.   Pulmonary/Chest: Effort normal. She has wheezes (minimal, intermittent).  Lymphadenopathy:       Head (right side): No submental, no submandibular, no tonsillar, no preauricular, no posterior auricular and no occipital adenopathy present.       Head (left side): No submental, no submandibular, no tonsillar, no preauricular, no posterior auricular and no occipital adenopathy present.    She has no cervical adenopathy.       Right: No supraclavicular adenopathy present.       Left: No  supraclavicular adenopathy present.    No results found  for this or any previous visit (from the past 24 hour(s)).  Assessment and Plan :   1. Right ear pain -Await results, will treat for mild otitis externa - Ear Culture - Fungal Stain - ciprofloxacin-dexamethasone (CIPRODEX) otic suspension; Place 4 drops into the right ear 2 (two) times daily.  Dispense: 7.5 mL; Refill: 0 -Pt is following up with Dr. Dorma Russell in one week. -Pt informed that if symptoms worsen to seek care sooner  2. Acute upper respiratory infection -Due to duration of symptoms, will cover for atypical organisms. - doxycycline (VIBRAMYCIN) 100 MG capsule; Take 1 capsule (100 mg total) by mouth 2 (two) times daily.  Dispense: 20 capsule; Refill: 0 - albuterol (PROVENTIL HFA;VENTOLIN HFA) 108 (90 Base) MCG/ACT inhaler; Inhale 2 puffs into the lungs every 4 (four) hours as needed for wheezing or shortness of breath (cough, shortness of breath or wheezing.).  Dispense: 1 Inhaler; Refill: 1 -Return to clinic if symptoms worsen, do not improve, or as needed  Benjiman Core, PA-C  [facility] 10/02/2016 11:12 AM

## 2016-10-05 LAB — EAR CULTURE

## 2016-10-06 LAB — FUNGAL STAIN

## 2019-12-26 ENCOUNTER — Ambulatory Visit: Payer: BC Managed Care – PPO | Attending: Internal Medicine

## 2019-12-26 DIAGNOSIS — Z20822 Contact with and (suspected) exposure to covid-19: Secondary | ICD-10-CM

## 2019-12-28 LAB — NOVEL CORONAVIRUS, NAA: SARS-CoV-2, NAA: NOT DETECTED

## 2020-10-11 DIAGNOSIS — N1330 Unspecified hydronephrosis: Secondary | ICD-10-CM | POA: Insufficient documentation

## 2021-01-02 DIAGNOSIS — M25562 Pain in left knee: Secondary | ICD-10-CM | POA: Diagnosis not present

## 2021-01-04 ENCOUNTER — Other Ambulatory Visit: Payer: Self-pay

## 2021-01-04 ENCOUNTER — Other Ambulatory Visit: Payer: BC Managed Care – PPO

## 2021-01-04 DIAGNOSIS — Z20822 Contact with and (suspected) exposure to covid-19: Secondary | ICD-10-CM

## 2021-01-06 LAB — SARS-COV-2, NAA 2 DAY TAT

## 2021-01-06 LAB — NOVEL CORONAVIRUS, NAA: SARS-CoV-2, NAA: DETECTED — AB

## 2021-01-14 DIAGNOSIS — E559 Vitamin D deficiency, unspecified: Secondary | ICD-10-CM | POA: Diagnosis not present

## 2021-01-14 DIAGNOSIS — E7800 Pure hypercholesterolemia, unspecified: Secondary | ICD-10-CM | POA: Diagnosis not present

## 2021-01-14 DIAGNOSIS — K5090 Crohn's disease, unspecified, without complications: Secondary | ICD-10-CM | POA: Diagnosis not present

## 2021-01-14 DIAGNOSIS — Z3200 Encounter for pregnancy test, result unknown: Secondary | ICD-10-CM | POA: Diagnosis not present

## 2021-01-14 DIAGNOSIS — E039 Hypothyroidism, unspecified: Secondary | ICD-10-CM | POA: Diagnosis not present

## 2021-01-14 DIAGNOSIS — Z79899 Other long term (current) drug therapy: Secondary | ICD-10-CM | POA: Diagnosis not present

## 2021-01-14 DIAGNOSIS — L508 Other urticaria: Secondary | ICD-10-CM | POA: Diagnosis not present

## 2021-01-14 DIAGNOSIS — M797 Fibromyalgia: Secondary | ICD-10-CM | POA: Diagnosis not present

## 2021-01-14 DIAGNOSIS — L4050 Arthropathic psoriasis, unspecified: Secondary | ICD-10-CM | POA: Diagnosis not present

## 2021-01-14 DIAGNOSIS — Z6841 Body Mass Index (BMI) 40.0 and over, adult: Secondary | ICD-10-CM | POA: Diagnosis not present

## 2021-01-26 DIAGNOSIS — M25562 Pain in left knee: Secondary | ICD-10-CM | POA: Diagnosis not present

## 2021-01-30 DIAGNOSIS — K50019 Crohn's disease of small intestine with unspecified complications: Secondary | ICD-10-CM | POA: Diagnosis not present

## 2021-02-19 DIAGNOSIS — H1045 Other chronic allergic conjunctivitis: Secondary | ICD-10-CM | POA: Diagnosis not present

## 2021-02-19 DIAGNOSIS — J4530 Mild persistent asthma, uncomplicated: Secondary | ICD-10-CM | POA: Diagnosis not present

## 2021-02-19 DIAGNOSIS — L501 Idiopathic urticaria: Secondary | ICD-10-CM | POA: Diagnosis not present

## 2021-02-20 DIAGNOSIS — N938 Other specified abnormal uterine and vaginal bleeding: Secondary | ICD-10-CM | POA: Diagnosis not present

## 2021-02-20 DIAGNOSIS — Z6841 Body Mass Index (BMI) 40.0 and over, adult: Secondary | ICD-10-CM | POA: Diagnosis not present

## 2021-02-26 ENCOUNTER — Ambulatory Visit
Admission: EM | Admit: 2021-02-26 | Discharge: 2021-02-26 | Disposition: A | Discharge status: Home / Self Care | Payer: BC Managed Care – PPO | Attending: Family Medicine | Admitting: Family Medicine

## 2021-02-26 ENCOUNTER — Other Ambulatory Visit: Payer: Self-pay

## 2021-02-26 DIAGNOSIS — R21 Rash and other nonspecific skin eruption: Secondary | ICD-10-CM | POA: Insufficient documentation

## 2021-02-26 LAB — POCT RAPID STREP A (OFFICE): Rapid Strep A Screen: NEGATIVE

## 2021-02-26 MED ORDER — PREDNISONE 10 MG PO TABS: 40.0000 mg | ORAL_TABLET | Freq: Every day | ORAL | 0 refills | Status: AC

## 2021-02-26 NOTE — ED Triage Notes (Signed)
Pt c/o itchy, painful rash to back increased fatigue, onset yesterday.   Denies fever, facial swelling, dysphagia.

## 2021-02-26 NOTE — Discharge Instructions (Addendum)
Your strep test was negative This may be a viral rash versus allergic reaction   We will try some prednisone to see if this helps.  Follow up as needed for continued or worsening symptoms

## 2021-02-27 DIAGNOSIS — Z79899 Other long term (current) drug therapy: Secondary | ICD-10-CM | POA: Diagnosis not present

## 2021-02-27 NOTE — ED Provider Notes (Signed)
Renaldo Fiddler    CSN: [id number] Arrival date & time: 02/26/21  1459      History   Chief Complaint Chief Complaint  Patient presents with  . Rash    HPI Kerry Martinez is a 35 y.o. female.   Pt is a 35 year old female that presents with itchy, painful rash to back increased fatigue, onset yesterday.  Denies fever, facial swelling, dysphagia.  Denies any fever, joint pain. Denies any recent changes in lotions, detergents, foods or other possible irritants. No recent travel. Nobody else at home has the rash. Patient has been outside but denies any contact with plants or insects. No new foods or medications.        Past Medical History:  Diagnosis Date  . Allergy   . Anemia   . Asthma   . Crohn's disease (HCC)   . Depression   . Migraine   . Polycystic ovarian syndrome 12/27/2010   s/p endocrinology consult/[facility].    Patient Active Problem List   Diagnosis Date Noted  . Anemia, iron deficiency 06/25/2014  . Insomnia 06/25/2014  . H/O surgical procedure 07/13/2013  . Anemia, unspecified 01/29/2013  . Vitamin B12 deficiency 01/29/2013  . Folate deficiency 01/29/2013  . Unspecified vitamin D deficiency 01/29/2013  . Peptic ulcer disease 11/27/2012  . Abdominal pain 11/27/2012  . Coffee ground emesis 11/27/2012  . Depression 11/27/2012  . Nausea and vomiting 11/27/2012  . Need for prophylactic vaccination against Streptococcus pneumoniae (pneumococcus) 11/27/2012  . Crohn's disease (HCC) 09/20/2012  . Extreme obesity 08/07/2012  . Bilateral polycystic ovarian syndrome 08/07/2012  . 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 MRSA (methicillin resistant staph aureus) culture positive 06/25/2011  . Asthma 06/25/2011  . Migraines 06/25/2011  . Family history of breast cancer 06/25/2011    Past Surgical History:  Procedure Laterality Date  . Admission  11/10/12   coffee ground emesis, abdominal pain.  EGD with 3 gastric ulcers and HH.  s/p lab band reversal.   [facility].  GI consults, Bariatric Surgery Consult.  . COLONOSCOPY W/ BIOPSIES    . ear graph  2004  . ESOPHAGOGASTRODUODENOSCOPY  11/12/12   3 gastric ulcers, hiatal hernia.  [facility].  . Lap band procedure  04/06/2011  . Lap Band Reversal  11/12/12   [facility]  . LAPAROSCOPIC GASTRIC SLEEVE RESECTION  03/27/2013  . MASTOIDECTOMY  1991  . PARTIAL COLECTOMY  12/26/12   Crohn's disease resection; [place]. [facility]  . TONSILLECTOMY  1997  . TYMPANOSTOMY TUBE PLACEMENT  1990    OB History   No obstetric history on file.      Home Medications    Prior to Admission medications   Medication Sig Start Date End Date Taking? Authorizing Provider  albuterol (PROVENTIL HFA;VENTOLIN HFA) 108 (90 Base) MCG/ACT inhaler Inhale 2 puffs into the lungs every 4 (four) hours as needed for wheezing or shortness of breath (cough, shortness of breath or wheezing.). 10/02/16  Yes Barnett Abu, [place] D, PA-C  budesonide (PULMICORT) 180 MCG/ACT inhaler Inhale 2 puffs into the lungs 2 (two) times daily. For shortness of breath. 12/19/13  Yes Elsie Stain E, PA-C  Cetirizine HCl (ZYRTEC ALLERGY) 10 MG CAPS  daily, 0 Refill(s) 10/11/20  Yes [provider]  clindamycin (CLINDAGEL) 1 % gel Apply topically. 07/18/20  Yes [provider]  ethynodiol-ethinyl estradiol Noni Saupe) 1-35 MG-MCG tablet Take 1 tablet by mouth daily.   Yes [provider]  famotidine (PEPCID) 20 MG tablet Take  20 mg by mouth 2 (two) times daily.   Yes [provider]  inFLIXimab (REMICADE) 100 MG injection Inject into the vein.   Yes [provider]  levocetirizine (XYZAL) 5 MG tablet Take 5 mg by mouth every evening.   Yes [provider]  montelukast (SINGULAIR) 10 MG tablet Take 10 mg by mouth at bedtime.   Yes [provider]  Omalizumab [place]) Inject into the skin.   Yes [provider]  predniSONE (DELTASONE) 10 MG tablet Take 4 tablets (40  mg total) by mouth daily for 5 days. 02/26/21 03/03/21 Yes Reeve Mallo A, NP  albuterol (PROVENTIL) (2.5 MG/3ML) 0.083% nebulizer solution Take 3 mLs (2.5 mg total) by nebulization every 6 (six) hours as needed for wheezing or shortness of breath. 12/19/13   Godfrey Pick, PA-C  buPROPion (WELLBUTRIN XL) 150 MG 24 hr tablet Take 1 tablet (150 mg total) by mouth daily. 12/23/14   [name], MD  Calcium Carbonate-Vitamin D (CALCIUM-VITAMIN D) 500-200 MG-UNIT per tablet Take 1 tablet by mouth 3 (three) times daily.    [provider]  cholecalciferol (VITAMIN D) 1000 UNITS tablet Take 1,000 Units by mouth daily.    [provider]  ciprofloxacin-dexamethasone (CIPRODEX) otic suspension Place 4 drops into the right ear 2 (two) times daily. 10/02/16   Benjiman Core D, PA-C  doxycycline (VIBRAMYCIN) 100 MG capsule Take 1 capsule (100 mg total) by mouth 2 (two) times daily. 10/02/16   Benjiman Core D, PA-C  FLUoxetine (PROZAC) 20 MG tablet Take 2 tablets (40 mg total) by mouth daily. 12/23/14   [name], MD  folic acid (FOLVITE) 1 MG tablet Take 1 mg by mouth daily.    [provider]  ipratropium (ATROVENT) 0.03 % nasal spray Place 2 sprays into the nose 2 (two) times daily. 12/23/14   [name], MD  levothyroxine (SYNTHROID) 112 MCG tablet     [provider]  methotrexate (RHEUMATREX) 2.5 MG tablet Take 2.5 mg by mouth once a week. Caution:Chemotherapy. Protect from light.    [provider]  Multiple Vitamins-Minerals (MULTIVITAMINS THER. W/MINERALS) TABS Take 1 tablet by mouth daily.    [provider]  prazosin (MINIPRESS) 5 MG capsule     [provider]  traZODone (DESYREL) 50 MG tablet Take 1-2 tablets (50-100 mg total) by mouth at bedtime as needed for sleep. 12/23/14   [name], MD  venlafaxine [facility]) 75 MG tablet     [provider]    Family History Family History  Problem Relation Age  of Onset  . Diabetes Father   . Hypertension Father   . Hyperlipidemia Father   . Other Sister        fibromyalgia  . Fibromyalgia Sister   . Cancer Mother        precancerous polyps in colon  . Asthma Mother   . Depression Mother   . Diabetes Mother   . Hyperlipidemia Mother   . Hypertension Mother   . Diabetes Maternal Grandmother   . ALS Maternal Grandmother   . Heart disease Maternal Grandfather   . Kidney disease Maternal Grandfather   . Diabetes Maternal Grandfather   . Diabetes Paternal Grandmother   . Diabetes Paternal Grandfather   . Parkinson's disease Paternal Grandfather     Social History Social History   Tobacco Use  . Smoking status: Never Smoker  . Smokeless tobacco: Never Used  Substance Use Topics  .  Alcohol use: Yes    Comment: socially  . Drug use: No     Allergies   Amoxicillin, Cefprozil, Cephalosporins, No known allergies, Multihance [gadobenate], and Sulfa antibiotics   Review of Systems Review of Systems   Physical Exam Triage Vital Signs ED Triage Vitals  Enc Vitals Group     BP 02/26/21 1515 (!) 152/90     Pulse Rate 02/26/21 1515 (!) 120     Resp 02/26/21 1515 (!) 22     Temp 02/26/21 1515 98.3 F (36.8 C)     Temp Source 02/26/21 1515 Oral     SpO2 02/26/21 1515 98 %     Weight --      Height --      Head Circumference --      Peak Flow --      Pain Score 02/26/21 1516 6     Pain Loc --      Pain Edu? --      Excl. in GC? --    No data found.  Updated Vital Signs BP (!) 152/90 (BP Location: Left Wrist)   Pulse (!) 103   Temp 98.3 F (36.8 C) (Oral)   Resp (!) 22   LMP 02/21/2021   SpO2 98%   Visual Acuity Right Eye Distance:   Left Eye Distance:   Bilateral Distance:    Right Eye Near:   Left Eye Near:    Bilateral Near:     Physical Exam Vitals and nursing note reviewed.  Constitutional:      General: She is not in acute distress.    Appearance: Normal appearance. She is obese. She is not  ill-appearing, toxic-appearing or diaphoretic.  HENT:     Head: Normocephalic.  Eyes:     Conjunctiva/sclera: Conjunctivae normal.  Pulmonary:     Effort: Pulmonary effort is normal.  Musculoskeletal:        General: Normal range of motion.     Cervical back: Normal range of motion.  Skin:    General: Skin is warm and dry.     Findings: Rash present.     Comments: Scattered papular rash with some hives to upper and lower back area.   Neurological:     Mental Status: She is alert.  Psychiatric:        Mood and Affect: Mood normal.      UC Treatments / Results  Labs (all labs ordered are listed, but only abnormal results are displayed) Labs Reviewed  CULTURE, GROUP A STREP [facility])  POCT RAPID STREP A (OFFICE)    EKG   Radiology No results found.  Procedures Procedures (including critical care time)  Medications Ordered in UC Medications - No data to display  Initial Impression / Assessment and Plan / UC Course  I have reviewed the triage vital signs and the nursing notes.  Pertinent labs & imaging results that were available during my care of the patient were reviewed by me and considered in my medical decision making (see chart for details).     Rash Viral versus allergic.  Heat rash on the differential Pt is already on antihistamines daily.  Will try treatment with prednisone to see if this helps in case allergic.  Follow up as needed for continued or worsening symptoms  Final Clinical Impressions(s) / UC Diagnoses   Final diagnoses:  Rash and nonspecific skin eruption     Discharge Instructions     Your strep test was negative This may be a viral  rash versus allergic reaction   We will try some prednisone to see if this helps.  Follow up as needed for continued or worsening symptoms     ED Prescriptions    Medication Sig Dispense Auth. Provider   predniSONE (DELTASONE) 10 MG tablet Take 4 tablets (40 mg total) by mouth daily for 5 days. 20  tablet Dahlia Byes A, NP     PDMP not reviewed this encounter.   Janace Aris, NP 02/27/21 [phone]

## 2021-03-01 LAB — CULTURE, GROUP A STREP (THRC)

## 2021-03-05 DIAGNOSIS — E538 Deficiency of other specified B group vitamins: Secondary | ICD-10-CM | POA: Diagnosis not present

## 2021-03-05 DIAGNOSIS — J45909 Unspecified asthma, uncomplicated: Secondary | ICD-10-CM | POA: Diagnosis not present

## 2021-03-05 DIAGNOSIS — E039 Hypothyroidism, unspecified: Secondary | ICD-10-CM | POA: Diagnosis not present

## 2021-03-05 DIAGNOSIS — M797 Fibromyalgia: Secondary | ICD-10-CM | POA: Diagnosis not present

## 2021-03-13 DIAGNOSIS — K50019 Crohn's disease of small intestine with unspecified complications: Secondary | ICD-10-CM | POA: Diagnosis not present

## 2021-03-13 DIAGNOSIS — L409 Psoriasis, unspecified: Secondary | ICD-10-CM | POA: Diagnosis not present

## 2021-03-16 DIAGNOSIS — Z79899 Other long term (current) drug therapy: Secondary | ICD-10-CM | POA: Diagnosis not present

## 2021-03-16 DIAGNOSIS — K5010 Crohn's disease of large intestine without complications: Secondary | ICD-10-CM | POA: Diagnosis not present

## 2021-03-25 DIAGNOSIS — Z1211 Encounter for screening for malignant neoplasm of colon: Secondary | ICD-10-CM | POA: Diagnosis not present

## 2021-03-25 DIAGNOSIS — Z01818 Encounter for other preprocedural examination: Secondary | ICD-10-CM | POA: Diagnosis not present

## 2021-03-25 DIAGNOSIS — K50019 Crohn's disease of small intestine with unspecified complications: Secondary | ICD-10-CM | POA: Diagnosis not present

## 2021-03-31 DIAGNOSIS — K5090 Crohn's disease, unspecified, without complications: Secondary | ICD-10-CM | POA: Diagnosis not present

## 2021-04-03 DIAGNOSIS — K50019 Crohn's disease of small intestine with unspecified complications: Secondary | ICD-10-CM | POA: Diagnosis not present

## 2021-04-07 DIAGNOSIS — Z79899 Other long term (current) drug therapy: Secondary | ICD-10-CM | POA: Diagnosis not present

## 2021-04-07 DIAGNOSIS — L4050 Arthropathic psoriasis, unspecified: Secondary | ICD-10-CM | POA: Diagnosis not present

## 2021-04-07 DIAGNOSIS — Z793 Long term (current) use of hormonal contraceptives: Secondary | ICD-10-CM | POA: Diagnosis not present

## 2021-04-07 DIAGNOSIS — Z30430 Encounter for insertion of intrauterine contraceptive device: Secondary | ICD-10-CM | POA: Diagnosis not present

## 2021-04-07 DIAGNOSIS — K5090 Crohn's disease, unspecified, without complications: Secondary | ICD-10-CM | POA: Diagnosis not present

## 2021-04-07 DIAGNOSIS — N939 Abnormal uterine and vaginal bleeding, unspecified: Secondary | ICD-10-CM | POA: Diagnosis not present

## 2021-04-07 DIAGNOSIS — Z6841 Body Mass Index (BMI) 40.0 and over, adult: Secondary | ICD-10-CM | POA: Diagnosis not present

## 2021-04-07 DIAGNOSIS — Z7951 Long term (current) use of inhaled steroids: Secondary | ICD-10-CM | POA: Diagnosis not present

## 2021-04-07 DIAGNOSIS — J45909 Unspecified asthma, uncomplicated: Secondary | ICD-10-CM | POA: Diagnosis not present

## 2021-04-07 DIAGNOSIS — Z79890 Hormone replacement therapy (postmenopausal): Secondary | ICD-10-CM | POA: Diagnosis not present

## 2021-04-07 DIAGNOSIS — Z9089 Acquired absence of other organs: Secondary | ICD-10-CM | POA: Diagnosis not present

## 2021-04-07 DIAGNOSIS — N921 Excessive and frequent menstruation with irregular cycle: Secondary | ICD-10-CM | POA: Diagnosis not present

## 2021-04-07 DIAGNOSIS — Z9049 Acquired absence of other specified parts of digestive tract: Secondary | ICD-10-CM | POA: Diagnosis not present

## 2021-04-07 DIAGNOSIS — Z9884 Bariatric surgery status: Secondary | ICD-10-CM | POA: Diagnosis not present

## 2021-04-07 DIAGNOSIS — Z880 Allergy status to penicillin: Secondary | ICD-10-CM | POA: Diagnosis not present

## 2021-04-07 DIAGNOSIS — E039 Hypothyroidism, unspecified: Secondary | ICD-10-CM | POA: Diagnosis not present

## 2021-04-07 DIAGNOSIS — E282 Polycystic ovarian syndrome: Secondary | ICD-10-CM | POA: Diagnosis not present

## 2021-04-08 DIAGNOSIS — Z30431 Encounter for routine checking of intrauterine contraceptive device: Secondary | ICD-10-CM | POA: Diagnosis not present

## 2021-04-08 DIAGNOSIS — R3915 Urgency of urination: Secondary | ICD-10-CM | POA: Diagnosis not present

## 2021-04-08 DIAGNOSIS — N898 Other specified noninflammatory disorders of vagina: Secondary | ICD-10-CM | POA: Diagnosis not present

## 2021-04-27 DIAGNOSIS — K50019 Crohn's disease of small intestine with unspecified complications: Secondary | ICD-10-CM | POA: Diagnosis not present

## 2021-05-12 DIAGNOSIS — K5010 Crohn's disease of large intestine without complications: Secondary | ICD-10-CM | POA: Diagnosis not present

## 2021-05-12 DIAGNOSIS — Z79899 Other long term (current) drug therapy: Secondary | ICD-10-CM | POA: Diagnosis not present

## 2021-05-14 DIAGNOSIS — F329 Major depressive disorder, single episode, unspecified: Secondary | ICD-10-CM | POA: Diagnosis not present

## 2021-05-14 DIAGNOSIS — F4312 Post-traumatic stress disorder, chronic: Secondary | ICD-10-CM | POA: Diagnosis not present

## 2021-06-01 DIAGNOSIS — F4312 Post-traumatic stress disorder, chronic: Secondary | ICD-10-CM | POA: Diagnosis not present

## 2021-06-01 DIAGNOSIS — F329 Major depressive disorder, single episode, unspecified: Secondary | ICD-10-CM | POA: Diagnosis not present

## 2021-06-15 DIAGNOSIS — F329 Major depressive disorder, single episode, unspecified: Secondary | ICD-10-CM | POA: Diagnosis not present

## 2021-06-15 DIAGNOSIS — F4312 Post-traumatic stress disorder, chronic: Secondary | ICD-10-CM | POA: Diagnosis not present

## 2021-06-30 DIAGNOSIS — F4323 Adjustment disorder with mixed anxiety and depressed mood: Secondary | ICD-10-CM | POA: Diagnosis not present

## 2021-06-30 DIAGNOSIS — F4312 Post-traumatic stress disorder, chronic: Secondary | ICD-10-CM | POA: Diagnosis not present

## 2021-06-30 DIAGNOSIS — F329 Major depressive disorder, single episode, unspecified: Secondary | ICD-10-CM | POA: Diagnosis not present

## 2021-07-08 DIAGNOSIS — F4323 Adjustment disorder with mixed anxiety and depressed mood: Secondary | ICD-10-CM | POA: Diagnosis not present

## 2021-07-08 DIAGNOSIS — F329 Major depressive disorder, single episode, unspecified: Secondary | ICD-10-CM | POA: Diagnosis not present

## 2021-07-08 DIAGNOSIS — F4312 Post-traumatic stress disorder, chronic: Secondary | ICD-10-CM | POA: Diagnosis not present

## 2021-07-21 DIAGNOSIS — H1045 Other chronic allergic conjunctivitis: Secondary | ICD-10-CM | POA: Diagnosis not present

## 2021-07-21 DIAGNOSIS — J3089 Other allergic rhinitis: Secondary | ICD-10-CM | POA: Diagnosis not present

## 2021-07-21 DIAGNOSIS — L501 Idiopathic urticaria: Secondary | ICD-10-CM | POA: Diagnosis not present

## 2021-07-21 DIAGNOSIS — J4530 Mild persistent asthma, uncomplicated: Secondary | ICD-10-CM | POA: Diagnosis not present

## 2021-07-27 DIAGNOSIS — Z79899 Other long term (current) drug therapy: Secondary | ICD-10-CM | POA: Diagnosis not present

## 2021-07-27 DIAGNOSIS — K5010 Crohn's disease of large intestine without complications: Secondary | ICD-10-CM | POA: Diagnosis not present

## 2021-07-28 ENCOUNTER — Telehealth: Payer: Self-pay

## 2021-07-28 DIAGNOSIS — L501 Idiopathic urticaria: Secondary | ICD-10-CM | POA: Diagnosis not present

## 2021-07-28 DIAGNOSIS — F329 Major depressive disorder, single episode, unspecified: Secondary | ICD-10-CM | POA: Diagnosis not present

## 2021-07-28 DIAGNOSIS — F4312 Post-traumatic stress disorder, chronic: Secondary | ICD-10-CM | POA: Diagnosis not present

## 2021-07-28 NOTE — Telephone Encounter (Signed)
Per Audria Nine, NP, schedule pt with him as new pt.   Contacted pt and scheduled for 08/25/21. Pt verbalized understanding. Advised if anything is needed before apt to contact the office.

## 2021-08-03 DIAGNOSIS — F329 Major depressive disorder, single episode, unspecified: Secondary | ICD-10-CM | POA: Diagnosis not present

## 2021-08-03 DIAGNOSIS — F4312 Post-traumatic stress disorder, chronic: Secondary | ICD-10-CM | POA: Diagnosis not present

## 2021-08-17 DIAGNOSIS — F4312 Post-traumatic stress disorder, chronic: Secondary | ICD-10-CM | POA: Diagnosis not present

## 2021-08-17 DIAGNOSIS — F329 Major depressive disorder, single episode, unspecified: Secondary | ICD-10-CM | POA: Diagnosis not present

## 2021-08-25 ENCOUNTER — Ambulatory Visit (INDEPENDENT_AMBULATORY_CARE_PROVIDER_SITE_OTHER): Payer: BC Managed Care – PPO | Admitting: Nurse Practitioner

## 2021-08-25 ENCOUNTER — Other Ambulatory Visit: Payer: Self-pay

## 2021-08-25 ENCOUNTER — Encounter: Payer: Self-pay | Admitting: Nurse Practitioner

## 2021-08-25 VITALS — BP 132/94 | HR 102 | Temp 98.0°F | Resp 14 | Ht 65.5 in | Wt 362.5 lb

## 2021-08-25 DIAGNOSIS — D509 Iron deficiency anemia, unspecified: Secondary | ICD-10-CM

## 2021-08-25 DIAGNOSIS — E559 Vitamin D deficiency, unspecified: Secondary | ICD-10-CM

## 2021-08-25 DIAGNOSIS — G43C0 Periodic headache syndromes in child or adult, not intractable: Secondary | ICD-10-CM | POA: Diagnosis not present

## 2021-08-25 DIAGNOSIS — E039 Hypothyroidism, unspecified: Secondary | ICD-10-CM | POA: Insufficient documentation

## 2021-08-25 DIAGNOSIS — Z0000 Encounter for general adult medical examination without abnormal findings: Secondary | ICD-10-CM

## 2021-08-25 DIAGNOSIS — J4520 Mild intermittent asthma, uncomplicated: Secondary | ICD-10-CM | POA: Diagnosis not present

## 2021-08-25 DIAGNOSIS — K5090 Crohn's disease, unspecified, without complications: Secondary | ICD-10-CM

## 2021-08-25 DIAGNOSIS — Z803 Family history of malignant neoplasm of breast: Secondary | ICD-10-CM

## 2021-08-25 DIAGNOSIS — R5383 Other fatigue: Secondary | ICD-10-CM | POA: Diagnosis not present

## 2021-08-25 DIAGNOSIS — F3340 Major depressive disorder, recurrent, in remission, unspecified: Secondary | ICD-10-CM | POA: Diagnosis not present

## 2021-08-25 DIAGNOSIS — L4050 Arthropathic psoriasis, unspecified: Secondary | ICD-10-CM

## 2021-08-25 DIAGNOSIS — Z6841 Body Mass Index (BMI) 40.0 and over, adult: Secondary | ICD-10-CM | POA: Diagnosis not present

## 2021-08-25 DIAGNOSIS — M797 Fibromyalgia: Secondary | ICD-10-CM | POA: Insufficient documentation

## 2021-08-25 DIAGNOSIS — E538 Deficiency of other specified B group vitamins: Secondary | ICD-10-CM

## 2021-08-25 LAB — CBC WITH DIFFERENTIAL/PLATELET
Basophils Absolute: 0.1 10*3/uL (ref 0.0–0.1)
Basophils Relative: 1.5 % (ref 0.0–3.0)
Eosinophils Absolute: 0.1 10*3/uL (ref 0.0–0.7)
Eosinophils Relative: 2 % (ref 0.0–5.0)
HCT: 38.2 % (ref 36.0–46.0)
Hemoglobin: 12.6 g/dL (ref 12.0–15.0)
Lymphocytes Relative: 53.8 % — ABNORMAL HIGH (ref 12.0–46.0)
Lymphs Abs: 3.3 10*3/uL (ref 0.7–4.0)
MCHC: 32.9 g/dL (ref 30.0–36.0)
MCV: 85.5 fl (ref 78.0–100.0)
Monocytes Absolute: 0.5 10*3/uL (ref 0.1–1.0)
Monocytes Relative: 7.7 % (ref 3.0–12.0)
Neutro Abs: 2.2 10*3/uL (ref 1.4–7.7)
Neutrophils Relative %: 35 % — ABNORMAL LOW (ref 43.0–77.0)
Platelets: 358 10*3/uL (ref 150.0–400.0)
RBC: 4.47 Mil/uL (ref 3.87–5.11)
RDW: 13.6 % (ref 11.5–15.5)
WBC: 6.2 10*3/uL (ref 4.0–10.5)

## 2021-08-25 LAB — IBC + FERRITIN
Ferritin: 14 ng/mL (ref 10.0–291.0)
Iron: 140 ug/dL (ref 42–145)
Saturation Ratios: 29.1 % (ref 20.0–50.0)
TIBC: 481.6 ug/dL — ABNORMAL HIGH (ref 250.0–450.0)
Transferrin: 344 mg/dL (ref 212.0–360.0)

## 2021-08-25 LAB — LIPID PANEL
Cholesterol: 211 mg/dL — ABNORMAL HIGH (ref 0–200)
HDL: 57.7 mg/dL (ref >39.00)
LDL Cholesterol: 124 mg/dL — ABNORMAL HIGH (ref 0–99)
NonHDL: 152.98
Total CHOL/HDL Ratio: 4
Triglycerides: 144 mg/dL (ref 0.0–149.0)
VLDL: 28.8 mg/dL (ref 0.0–40.0)

## 2021-08-25 LAB — COMPREHENSIVE METABOLIC PANEL
ALT: 17 U/L (ref 0–35)
AST: 16 U/L (ref 0–37)
Albumin: 3.9 g/dL (ref 3.5–5.2)
Alkaline Phosphatase: 81 U/L (ref 39–117)
BUN: 11 mg/dL (ref 6–23)
CO2: 29 mEq/L (ref 19–32)
Calcium: 9.4 mg/dL (ref 8.4–10.5)
Chloride: 102 mEq/L (ref 96–112)
Creatinine, Ser: 0.66 mg/dL (ref 0.40–1.20)
GFR: 115.85 mL/min (ref >60.00)
Glucose, Bld: 76 mg/dL (ref 70–99)
Potassium: 4.5 mEq/L (ref 3.5–5.1)
Sodium: 138 mEq/L (ref 135–145)
Total Bilirubin: 0.4 mg/dL (ref 0.2–1.2)
Total Protein: 6.9 g/dL (ref 6.0–8.3)

## 2021-08-25 LAB — VITAMIN D 25 HYDROXY (VIT D DEFICIENCY, FRACTURES): VITD: 43.92 ng/mL (ref 30.00–100.00)

## 2021-08-25 LAB — FOLATE: Folate: 5.8 ng/mL — ABNORMAL LOW (ref >5.9)

## 2021-08-25 LAB — TSH: TSH: 1.42 u[IU]/mL (ref 0.35–5.50)

## 2021-08-25 LAB — HEMOGLOBIN A1C: Hgb A1c MFr Bld: 5.7 % (ref 4.6–6.5)

## 2021-08-25 LAB — VITAMIN B12: Vitamin B-12: 299 pg/mL (ref 211–911)

## 2021-08-25 NOTE — Assessment & Plan Note (Signed)
Patient with history of the same.  Has required iron infusions in the past.  Patient is experiencing increased fatigue per her normal.  We will check iron studies today pending lab results.

## 2021-08-25 NOTE — Assessment & Plan Note (Addendum)
Currently maintained levothhyroxine .  Patient has been experiencing more fatigue as of late.  We will check TSH in office.  Continue levothyroxine 112 mcg as prescribed.  Pending lab results

## 2021-08-25 NOTE — Assessment & Plan Note (Addendum)
On gabapentin 600mg  [facility]HS.  Was diagnosed by rheumatology.  Has not had a rheumatologist as of late.  We will refer her to rheumatology to manage psoriatic arthritis and fibromyalgia. Continue gabapentin 600 mg nightly.

## 2021-08-25 NOTE — Assessment & Plan Note (Signed)
Patient has been experiencing fatigue as of late.  States after sleeping and throughout the day.  Has had a lot of family stressors going on dealing with different family members and health issues.  We will check labs in office today pending lab results.

## 2021-08-25 NOTE — Assessment & Plan Note (Addendum)
Currently maintained on 10,000IU daily.  Pending labs.  Continue 10,000 IU vitamin D once daily.

## 2021-08-25 NOTE — Assessment & Plan Note (Signed)
Discussed lifestyle modifications with patient.  Medically complex.  Does have lots of joint pain and muscle pain dealing with other diagnoses.  Did discuss seated exercises if unable to do weightbearing exercises.  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  Also discussed low impact yoga, this was patient's idea.  Do think this will be helpful in several disease processes.

## 2021-08-25 NOTE — Assessment & Plan Note (Signed)
History of B12 anemia.  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  Currently maintained on cyanocobalamin 1000 mcg action monthly.  We will check B12 level in office pending lab results.  Continue cyanocobalamin 1000 mcg IM once monthly.

## 2021-08-25 NOTE — Assessment & Plan Note (Signed)
States that she has 2 maternal aunts that have breast cancer.  Her mother has had breast densities that were noncancerous.  Patient too young to begin mammogram screenings.  We will consider baseline screening at 35 years of age.

## 2021-08-25 NOTE — Progress Notes (Signed)
New Patient Office Visit  Subjective:  Patient ID: Kerry Martinez, female    DOB: Sep 07, 1986  Age: 35 y.o. MRN: [id number]  CC:  New patient Physical  Chief Complaint  Patient presents with   Establish Care   Fatigue    Would like to follow up on her iron levels, energy been low she noticed. Has had iron infusions in the past.    HPI Kerry Martinez presents for Establish care  for complete physical and follow up of chronic conditions.  Immunizations: -Tetanus: Due -Influenza: Due wants to wait -Covid-19: Moderna x2 with 2 booster -Shingles: N/A -Pneumonia: 07/15/2016, 11/27/2012  -HPV: UTD per patient report.  Diet: Fair diet. Eats three times a day. Recently eating out more. Most days cooks dinner at home. Loves [place]. Exercise: No regular exercise.   Eye exam: Completes annually  Dental exam: Completes semi-annually   Pap Smear: Completed in 03/2021 hysteroscopy that was normal. [place] Mammogram: NA moms aunt x2 and lump in mother Dexa:  completed in 2016/2017 from GI Colonoscopy: Completed in 2022. By [facility]. Dr. Celene Kras.   Lung Cancer Screening: Never smoker.   Past Medical History:  Diagnosis Date   Allergy    Anemia    Asthma    Crohn's disease (HCC)    Depression    Migraine    Polycystic ovarian syndrome 12/27/2010   s/p endocrinology consult/[facility].    Past Surgical History:  Procedure Laterality Date   Admission  11/10/12   coffee ground emesis, abdominal pain.  EGD with 3 gastric ulcers and HH.  s/p lab band reversal.  [facility].  GI consults, Bariatric Surgery Consult.   COLONOSCOPY W/ BIOPSIES     ear graph  2004   ESOPHAGOGASTRODUODENOSCOPY  11/12/12   3 gastric ulcers, hiatal hernia.  [facility].   Lap band procedure  04/06/2011   Lap Band Reversal  11/12/12   [facility]   LAPAROSCOPIC GASTRIC SLEEVE RESECTION  03/27/2013   MASTOIDECTOMY  1991   PARTIAL COLECTOMY  12/26/12   Crohn's disease resection;  [facility]   TONSILLECTOMY  1997   TYMPANOSTOMY TUBE PLACEMENT  1990    Family History  Problem Relation Age of Onset   Diabetes Father    Hypertension Father    Hyperlipidemia Father    Other Sister        fibromyalgia   Fibromyalgia Sister    Cancer Mother        precancerous polyps in colon   Asthma Mother    Depression Mother    Diabetes Mother    Hyperlipidemia Mother    Hypertension Mother    Diabetes Maternal Grandmother    ALS Maternal Grandmother    Heart disease Maternal Grandfather    Kidney disease Maternal Grandfather    Diabetes Maternal Grandfather    Diabetes Paternal Grandmother    Diabetes Paternal Grandfather    Parkinson's disease Paternal Grandfather     Social History   Socioeconomic History   Marital status: Single    Spouse name: Not on file   Number of children: Not on file   Years of education: Not on file   Highest education level: Not on file  Occupational History   Not on file  Tobacco Use   Smoking status: Never   Smokeless tobacco: Never  Substance and Sexual Activity   Alcohol use: Yes    Comment: socially   Drug use: No   Sexual activity: Not Currently  Other Topics  Concern   Not on file  Social History Narrative   Marital status: single; not dating.      Children: none      Lives: with parents.      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      Education: [employer] in fall 2014.      Tobacco: none      Alcohol: socially; rarely.      Drugs :none      Exercise: sporadic exercise due to Crohn's disease.  Walking three times per week.      Sexual activity: one partner.  Last sexual activity 2 years ago.  No STDs.      Seatbelt:  100% of time.      Sunscreen: SPF 70.   Social Determinants of Health   Financial Resource Strain: Not on file  Food Insecurity: Not on file  Transportation Needs: Not on file  Physical Activity: Not on file  Stress: Not on  file  Social Connections: Not on file  Intimate Partner Violence: Not on file    ROS Review of Systems  Constitutional:  Positive for fatigue. Negative for chills and fever.  Respiratory:  Negative for apnea, cough and shortness of breath.   Cardiovascular:  Negative for chest pain, palpitations and leg swelling.  Gastrointestinal:  Positive for abdominal pain and diarrhea. Negative for blood in stool, constipation, nausea and vomiting.  Genitourinary:  Negative for dysuria, hematuria, vaginal bleeding, vaginal discharge and vaginal pain.  Musculoskeletal:  Positive for arthralgias, joint swelling and myalgias.  Neurological:  Positive for numbness (upper extremities due to neuropathy) and headaches. Negative for dizziness and light-headedness.  Psychiatric/Behavioral:  Negative for hallucinations and suicidal ideas.    Objective:   Today's Vitals: There were no vitals taken for this visit.  Physical Exam Vitals and nursing note reviewed.  Constitutional:      Appearance: She is obese.  HENT:     Right Ear: Ear canal and external ear normal. There is no impacted cerumen.     Left Ear: Tympanic membrane, ear canal and external ear normal. There is no impacted cerumen.     Mouth/Throat:     Mouth: Mucous membranes are moist.     Pharynx: Oropharynx is clear. No posterior oropharyngeal erythema.  Eyes:     Extraocular Movements: Extraocular movements intact.     Pupils: Pupils are equal, round, and reactive to light.  Neck:     Vascular: No carotid bruit.  Cardiovascular:     Rate and Rhythm: Normal rate and regular rhythm.  Pulmonary:     Effort: Pulmonary effort is normal.     Breath sounds: Normal breath sounds.  Abdominal:     General: Bowel sounds are normal.  Musculoskeletal:     Right lower leg: No edema.     Left lower leg: No edema.  Lymphadenopathy:     Cervical: No cervical adenopathy.  Skin:    General: Skin is warm.  Neurological:     Mental Status: She is  alert.     Motor: No weakness.     Gait: Gait normal.  Psychiatric:        Mood and Affect: Mood normal.        Behavior: Behavior normal.        Thought Content: Thought content normal.        Judgment: Judgment normal.    Assessment & Plan:   Problem List  Items Addressed This Visit       Cardiovascular and Mediastinum   Migraines    Not currenlty on any medication. Happens every few months. Takes ecedrin to help along with caffiene. Mostly frontal. States has photo, phono,N/V. Does have aura with visual distrubance.      Relevant Medications   doxepin (SINEQUAN) 10 MG capsule   EPINEPHrine 0.3 mg/0.3 mL IJ SOAJ injection   Methotrexate, PF, (RASUVO) 20 MG/0.4ML SOAJ   prazosin (MINIPRESS) 2 MG capsule   traMADol (ULTRAM) 50 MG tablet   gabapentin (NEURONTIN) 600 MG tablet     Respiratory   Asthma    Dr. Pomona Callas at [facility]. Maintained on Proair, pulmicort. Manages Antihistamines and Xolair.  Continue following up as recommended by allergist.  Continue taking Zyrtec, Singulair, Pepcid, Xolair, and as needed EpiPen as prescribed.  Also continue ProAir inhaler and Pulmicort inhaler.      Relevant Medications   albuterol (VENTOLIN HFA) 108 (90 Base) MCG/ACT inhaler     Digestive   Crohn's disease (HCC)    Currently managed by [facility].  She is on Remicade 5 mg/kg infusion along with Benadryl IV during infusions every 8 weeks.  Currently stable on treatment no exacerbation.  Continue follow-up with GI specialist in infusion center.        Endocrine   Acquired hypothyroidism    Currently maintained levothhyroxine .  Patient has been experiencing more fatigue as of late.  We will check TSH in office.  Continue levothyroxine 112 mcg as prescribed.  Pending lab results      Relevant Orders   TSH     Musculoskeletal and Integument   Psoriatic arthritis (HCC)    Diagnosed by rheumatology.  Was on Rasuvo, last injection approximately 2 to 3 months ago.   Referred patient to rheumatology to manage psoriatic arthritis and fibromyalgia.      Relevant Medications   Methotrexate, PF, (RASUVO) 20 MG/0.4ML SOAJ   traMADol (ULTRAM) 50 MG tablet   Other Relevant Orders   Ambulatory referral to Rheumatology     Other   Family history of breast cancer    States that she has 2 maternal aunts that have breast cancer.  Her mother has had breast densities that were noncancerous.  Patient too young to begin mammogram screenings.  We will consider baseline screening at 35 years of age.      Depression    Currently maintained on Effexor and prazosin. Sees marion at [facility]. Therapy Hal Neer every other week. Continue Effexor and prazosin as prescribed continue seeing Shirlee Limerick at [facility].  Continue therapy with Hal Neer as scheduled.      Relevant Medications   doxepin (SINEQUAN) 10 MG capsule   Vitamin B12 deficiency    History of B12 anemia.  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  Currently maintained on cyanocobalamin 1000 mcg action monthly.  We will check B12 level in office pending lab results.  Continue cyanocobalamin 1000 mcg IM once monthly.      Relevant Orders   Vitamin B12   Folate deficiency    History of same.  Unsure of etiology.  Patient has been on injectable methotrexate in the past.  Has been off the past few months as she has not had a rheumatologist.  Was taking for psoriatic arthritis.  Pending labs      Relevant Orders   Folate   Vitamin D deficiency    Currently maintained on 10,000IU daily.  Pending labs.  Continue 10,000 IU vitamin D once daily.      Relevant Orders   VITAMIN D 25 Hydroxy (Vit-D Deficiency, Fractures)   Morbid obesity (HCC)    Discussed lifestyle modifications with patient.  Medically complex.  Does have lots of joint pain and muscle pain dealing with other diagnoses.  Did discuss seated exercises if unable to do weightbearing exercises.  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  Also discussed low impact yoga, this was patient's idea.  Do think this will be helpful in several disease processes.      Relevant Orders   Hemoglobin A1c   Lipid panel   Anemia, iron deficiency    Patient with history of the same.  Has required iron infusions in the past.  Patient is experiencing increased fatigue per her normal.  We will check iron studies today pending lab results.      Relevant Medications   cyanocobalamin (,VITAMIN B-12,) 1000 MCG/ML injection   Other Relevant Orders   IBC + Ferritin   Fibromyalgia    On gabapentin 600mg  [facility].  Was diagnosed by rheumatology.  Has not had a rheumatologist as of late.  We will refer her to rheumatology to manage psoriatic arthritis and fibromyalgia. Continue gabapentin 600 mg nightly.      Relevant Medications   doxepin (SINEQUAN) 10 MG capsule   Methotrexate, PF, (RASUVO) 20 MG/0.4ML SOAJ   traMADol (ULTRAM) 50 MG tablet   gabapentin (NEURONTIN) 600 MG tablet   Other Relevant Orders   Ambulatory referral to Rheumatology   Fatigue    Patient has been experiencing fatigue as of late.  States after sleeping and throughout the day.  Has had a lot of family stressors going on dealing with different family members and health issues.  We will check labs in office today pending lab results.      Relevant Orders   Vitamin B12   IBC + Ferritin   Preventative health care - Primary    Discussed and reviewed preventative exams for age group and comorbidities.      Relevant Orders   CBC with Differential/Platelet   Comprehensive metabolic panel   TSH   Lipid panel   Other Visit Diagnoses     BMI 50.0-59.9, adult (HCC)       Relevant Orders   Hemoglobin A1c   Lipid panel       Outpatient Encounter Medications as of 08/25/2021  Medication Sig   albuterol (PROVENTIL HFA;VENTOLIN HFA) 108 (90 Base) MCG/ACT inhaler Inhale 2 puffs into the lungs every 4 (four) hours as needed for wheezing or shortness of  breath (cough, shortness of breath or wheezing.).   albuterol (PROVENTIL) (2.5 MG/3ML) 0.083% nebulizer solution Take 3 mLs (2.5 mg total) by nebulization every 6 (six) hours as needed for wheezing or shortness of breath.   budesonide (PULMICORT) 180 MCG/ACT inhaler Inhale 2 puffs into the lungs 2 (two) times daily. For shortness of breath.   buPROPion (WELLBUTRIN XL) 150 MG 24 hr tablet Take 1 tablet (150 mg total) by mouth daily.   Calcium Carbonate-Vitamin D (CALCIUM-VITAMIN D) 500-200 MG-UNIT per tablet Take 1 tablet by mouth 3 (three) times daily.   Cetirizine HCl (ZYRTEC ALLERGY) 10 MG CAPS  daily, 0 Refill(s)   cholecalciferol (VITAMIN D) 1000 UNITS tablet Take 1,000 Units by mouth daily.   ciprofloxacin-dexamethasone (CIPRODEX) otic suspension Place 4 drops into the right ear 2 (two) times daily.   clindamycin (CLINDAGEL) 1 % gel Apply topically.   doxycycline (  VIBRAMYCIN) 100 MG capsule Take 1 capsule (100 mg total) by mouth 2 (two) times daily.   ethynodiol-ethinyl estradiol (KELNOR,ZOVIA) 1-35 MG-MCG tablet Take 1 tablet by mouth daily.   famotidine (PEPCID) 20 MG tablet Take 20 mg by mouth 2 (two) times daily.   FLUoxetine (PROZAC) 20 MG tablet Take 2 tablets (40 mg total) by mouth daily.   folic acid (FOLVITE) 1 MG tablet Take 1 mg by mouth daily.   inFLIXimab (REMICADE) 100 MG injection Inject into the vein.   ipratropium (ATROVENT) 0.03 % nasal spray Place 2 sprays into the nose 2 (two) times daily.   levocetirizine (XYZAL) 5 MG tablet Take 5 mg by mouth every evening.   levothyroxine (SYNTHROID) 112 MCG tablet    methotrexate (RHEUMATREX) 2.5 MG tablet Take 2.5 mg by mouth once a week. Caution:Chemotherapy. Protect from light.   montelukast (SINGULAIR) 10 MG tablet Take 10 mg by mouth at bedtime.   Multiple Vitamins-Minerals (MULTIVITAMINS THER. W/MINERALS) TABS Take 1 tablet by mouth daily.   Omalizumab (XOLAIR [place]) Inject into the skin.   prazosin (MINIPRESS) 5 MG capsule     traZODone (DESYREL) 50 MG tablet Take 1-2 tablets (50-100 mg total) by mouth at bedtime as needed for sleep.   venlafaxine (EFFEXOR) 75 MG tablet    No facility-administered encounter medications on file as of 08/25/2021.   This visit occurred during the SARS-CoV-2 public health emergency.  Safety protocols were in place, including screening questions prior to the visit, additional usage of staff PPE, and extensive cleaning of exam room while observing appropriate contact time as indicated for disinfecting solutions.   Follow-up: Return in about 6 months (around 02/23/2022).   Audria NineMatt Almin Livingstone, NP

## 2021-08-25 NOTE — Assessment & Plan Note (Addendum)
Currently maintained on Effexor and prazosin. Sees marion at [facility]. Therapy Hal Neer every other week. Continue Effexor and prazosin as prescribed continue seeing Shirlee Limerick at [facility].  Continue therapy with Hal Neer as scheduled.

## 2021-08-25 NOTE — Patient Instructions (Signed)
Nice to see you today Will be in touch regarding your labs Follow up with me in 6 months, sooner if needed

## 2021-08-25 NOTE — Assessment & Plan Note (Addendum)
Dr. Foard Callas at [facility]. Maintained on Proair, pulmicort. Manages Antihistamines and Xolair.  Continue following up as recommended by allergist.  Continue taking Zyrtec, Singulair, Pepcid, Xolair, and as needed EpiPen as prescribed.  Also continue ProAir inhaler and Pulmicort inhaler.

## 2021-08-25 NOTE — Assessment & Plan Note (Signed)
Currently managed by [facility].  She is on Remicade 5 mg/kg infusion along with Benadryl IV during infusions every 8 weeks.  Currently stable on treatment no exacerbation.  Continue follow-up with GI specialist in infusion center.

## 2021-08-25 NOTE — Assessment & Plan Note (Signed)
Diagnosed by rheumatology.  Was on Rasuvo, last injection approximately 2 to 3 months ago.  Referred patient to rheumatology to manage psoriatic arthritis and fibromyalgia.

## 2021-08-25 NOTE — Assessment & Plan Note (Signed)
Discussed and reviewed preventative exams for age group and comorbidities.

## 2021-08-25 NOTE — Assessment & Plan Note (Signed)
History of same.  Unsure of etiology.  Patient has been on injectable methotrexate in the past.  Has been off the past few months as she has not had a rheumatologist.  Was taking for psoriatic arthritis.  Pending labs

## 2021-08-25 NOTE — Assessment & Plan Note (Signed)
Not currenlty on any medication. Happens every few months. Takes ecedrin to help along with caffiene. Mostly frontal. States has photo, phono,N/V. Does have aura with visual distrubance.

## 2021-08-26 ENCOUNTER — Encounter: Payer: Self-pay | Admitting: Nurse Practitioner

## 2021-08-27 ENCOUNTER — Other Ambulatory Visit: Payer: Self-pay | Admitting: Nurse Practitioner

## 2021-08-27 DIAGNOSIS — E559 Vitamin D deficiency, unspecified: Secondary | ICD-10-CM

## 2021-08-27 MED ORDER — VITAMIN D (ERGOCALCIFEROL) 1.25 MG (50000 UNIT) PO CAPS: 50000.0000 [IU] | ORAL_CAPSULE | ORAL | 1 refills | Status: DC

## 2021-09-10 DIAGNOSIS — F4312 Post-traumatic stress disorder, chronic: Secondary | ICD-10-CM | POA: Diagnosis not present

## 2021-09-10 DIAGNOSIS — F329 Major depressive disorder, single episode, unspecified: Secondary | ICD-10-CM | POA: Diagnosis not present

## 2021-09-14 DIAGNOSIS — F4312 Post-traumatic stress disorder, chronic: Secondary | ICD-10-CM | POA: Diagnosis not present

## 2021-09-14 DIAGNOSIS — F329 Major depressive disorder, single episode, unspecified: Secondary | ICD-10-CM | POA: Diagnosis not present

## 2021-09-21 DIAGNOSIS — Z79899 Other long term (current) drug therapy: Secondary | ICD-10-CM | POA: Diagnosis not present

## 2021-09-21 DIAGNOSIS — K5010 Crohn's disease of large intestine without complications: Secondary | ICD-10-CM | POA: Diagnosis not present

## 2021-09-23 ENCOUNTER — Other Ambulatory Visit: Payer: Self-pay | Admitting: Nurse Practitioner

## 2021-09-23 ENCOUNTER — Encounter: Payer: Self-pay | Admitting: Nurse Practitioner

## 2021-09-23 DIAGNOSIS — J4520 Mild intermittent asthma, uncomplicated: Secondary | ICD-10-CM

## 2021-09-23 MED ORDER — MONTELUKAST SODIUM 10 MG PO TABS: 10.0000 mg | ORAL_TABLET | Freq: Every day | ORAL | 3 refills | Status: DC

## 2021-09-28 DIAGNOSIS — F329 Major depressive disorder, single episode, unspecified: Secondary | ICD-10-CM | POA: Diagnosis not present

## 2021-09-28 DIAGNOSIS — M797 Fibromyalgia: Secondary | ICD-10-CM | POA: Diagnosis not present

## 2021-09-28 DIAGNOSIS — L4050 Arthropathic psoriasis, unspecified: Secondary | ICD-10-CM | POA: Diagnosis not present

## 2021-09-28 DIAGNOSIS — K50019 Crohn's disease of small intestine with unspecified complications: Secondary | ICD-10-CM | POA: Diagnosis not present

## 2021-09-28 DIAGNOSIS — F4312 Post-traumatic stress disorder, chronic: Secondary | ICD-10-CM | POA: Diagnosis not present

## 2021-10-12 DIAGNOSIS — F4312 Post-traumatic stress disorder, chronic: Secondary | ICD-10-CM | POA: Diagnosis not present

## 2021-10-12 DIAGNOSIS — F329 Major depressive disorder, single episode, unspecified: Secondary | ICD-10-CM | POA: Diagnosis not present

## 2021-10-14 ENCOUNTER — Encounter: Payer: Self-pay | Admitting: Nurse Practitioner

## 2021-10-14 MED ORDER — GABAPENTIN 600 MG PO TABS: 600.0000 mg | ORAL_TABLET | Freq: Every day | ORAL | 1 refills | Status: DC

## 2021-10-27 DIAGNOSIS — F329 Major depressive disorder, single episode, unspecified: Secondary | ICD-10-CM | POA: Diagnosis not present

## 2021-10-27 DIAGNOSIS — F4312 Post-traumatic stress disorder, chronic: Secondary | ICD-10-CM | POA: Diagnosis not present

## 2021-10-29 DIAGNOSIS — F329 Major depressive disorder, single episode, unspecified: Secondary | ICD-10-CM | POA: Diagnosis not present

## 2021-10-29 DIAGNOSIS — F4312 Post-traumatic stress disorder, chronic: Secondary | ICD-10-CM | POA: Diagnosis not present

## 2021-11-16 ENCOUNTER — Other Ambulatory Visit: Payer: Self-pay | Admitting: Nurse Practitioner

## 2021-11-16 DIAGNOSIS — K5010 Crohn's disease of large intestine without complications: Secondary | ICD-10-CM | POA: Diagnosis not present

## 2021-11-16 DIAGNOSIS — Z452 Encounter for adjustment and management of vascular access device: Secondary | ICD-10-CM

## 2021-11-16 NOTE — Progress Notes (Signed)
Port a cath no working

## 2021-11-16 NOTE — H&P (View-Only) (Signed)
Port a cath no working

## 2021-11-25 ENCOUNTER — Other Ambulatory Visit: Payer: Self-pay | Admitting: Nurse Practitioner

## 2021-11-25 ENCOUNTER — Telehealth (INDEPENDENT_AMBULATORY_CARE_PROVIDER_SITE_OTHER): Payer: Self-pay

## 2021-11-25 ENCOUNTER — Encounter: Payer: Self-pay | Admitting: Nurse Practitioner

## 2021-11-25 DIAGNOSIS — Z789 Other specified health status: Secondary | ICD-10-CM

## 2021-11-25 NOTE — H&P (View-Only) (Signed)
[place] Is using for infusion related to GI illness. Last 2 appointments at infusion clinic they access the port but are unable to get blood return or flush and have to try and stick patient peripherally to deliver medications. Needs consult to see if we can get port working or if it needs replaced

## 2021-11-25 NOTE — Telephone Encounter (Signed)
FYI I sent patient a message letting her know that new urgent referral has been sent.   She can call them back to schedule.

## 2021-11-25 NOTE — Progress Notes (Signed)
Patient had port placed to right [place] of [place]. Is using for infusion related to GI illness. Last 2 appointments at infusion clinic they access the port but are unable to get blood return or flush and have to try and stick patient peripherally to deliver medications. Needs consult to see if we can get port working or if it needs replaced

## 2021-11-25 NOTE — Telephone Encounter (Signed)
Spoke with the patient to let her know that there were no notes or clarification for was needed. The order states port placement/removal and no diagnosis. Patient stated she would call her PCP to have this information sent over.

## 2021-11-26 NOTE — Telephone Encounter (Signed)
Spoke with the patient and she is scheduled with Dr. Wyn Quaker for a port placement on 11/30/21 with a 9:15 am arrival time to the [facility]. Pre-procedure instructions were discussed and will be mailed.

## 2021-11-29 ENCOUNTER — Other Ambulatory Visit (INDEPENDENT_AMBULATORY_CARE_PROVIDER_SITE_OTHER): Payer: Self-pay | Admitting: Nurse Practitioner

## 2021-11-30 ENCOUNTER — Ambulatory Visit
Admission: RE | Admit: 2021-11-30 | Discharge: 2021-11-30 | Disposition: A | Discharge status: Home / Self Care | Payer: BC Managed Care – PPO | Source: Ambulatory Visit | Attending: Vascular Surgery | Admitting: Vascular Surgery

## 2021-11-30 ENCOUNTER — Other Ambulatory Visit: Payer: Self-pay

## 2021-11-30 ENCOUNTER — Encounter
Admission: RE | Disposition: A | Discharge status: Home / Self Care | Payer: Self-pay | Source: Ambulatory Visit | Attending: Vascular Surgery

## 2021-11-30 ENCOUNTER — Encounter: Payer: Self-pay | Admitting: Vascular Surgery

## 2021-11-30 DIAGNOSIS — Z452 Encounter for adjustment and management of vascular access device: Secondary | ICD-10-CM | POA: Diagnosis not present

## 2021-11-30 DIAGNOSIS — T82898A Other specified complication of vascular prosthetic devices, implants and grafts, initial encounter: Secondary | ICD-10-CM | POA: Diagnosis not present

## 2021-11-30 DIAGNOSIS — K5090 Crohn's disease, unspecified, without complications: Secondary | ICD-10-CM | POA: Diagnosis not present

## 2021-11-30 DIAGNOSIS — K50918 Crohn's disease, unspecified, with other complication: Secondary | ICD-10-CM

## 2021-11-30 HISTORY — DX: Arthropathic psoriasis, unspecified: L40.50

## 2021-11-30 HISTORY — DX: Fibromyalgia: M79.7

## 2021-11-30 HISTORY — PX: PORTA CATH INSERTION: CATH118285

## 2021-11-30 SURGERY — PORTA CATH INSERTION
Anesthesia: Moderate Sedation

## 2021-11-30 MED ORDER — MIDAZOLAM HCL 2 MG/2ML IJ SOLN
INTRAMUSCULAR | Status: AC
  Filled 2021-11-30: qty 2

## 2021-11-30 MED ORDER — MIDAZOLAM HCL 2 MG/2ML IJ SOLN
INTRAMUSCULAR | Status: DC | PRN
  Administered 2021-11-30: 2 mg via INTRAVENOUS

## 2021-11-30 MED ORDER — CLINDAMYCIN PHOSPHATE 300 MG/50ML IV SOLN
300.0000 mg | Freq: Once | INTRAVENOUS | Status: AC
  Administered 2021-11-30: 300 mg via INTRAVENOUS

## 2021-11-30 MED ORDER — METHYLPREDNISOLONE SODIUM SUCC 125 MG IJ SOLR: 125.0000 mg | Freq: Once | INTRAMUSCULAR | Status: DC | PRN

## 2021-11-30 MED ORDER — IODIXANOL 320 MG/ML IV SOLN
INTRAVENOUS | Status: DC | PRN
  Administered 2021-11-30: 5 mL

## 2021-11-30 MED ORDER — SODIUM CHLORIDE 0.9 % IV SOLN
Freq: Once | INTRAVENOUS | Status: DC
  Filled 2021-11-30: qty 2

## 2021-11-30 MED ORDER — HYDROMORPHONE HCL 1 MG/ML IJ SOLN: 1.0000 mg | Freq: Once | INTRAMUSCULAR | Status: DC | PRN

## 2021-11-30 MED ORDER — MIDAZOLAM HCL 2 MG/ML PO SYRP: 8.0000 mg | ORAL_SOLUTION | Freq: Once | ORAL | Status: DC | PRN

## 2021-11-30 MED ORDER — CHLORHEXIDINE GLUCONATE CLOTH 2 % EX PADS: 6.0000 | MEDICATED_PAD | Freq: Every day | CUTANEOUS | Status: DC

## 2021-11-30 MED ORDER — FENTANYL CITRATE PF 50 MCG/ML IJ SOSY
PREFILLED_SYRINGE | INTRAMUSCULAR | Status: AC
  Filled 2021-11-30: qty 1

## 2021-11-30 MED ORDER — SODIUM CHLORIDE 0.9 % IV SOLN: INTRAVENOUS | Status: DC

## 2021-11-30 MED ORDER — CLINDAMYCIN PHOSPHATE 300 MG/50ML IV SOLN
INTRAVENOUS | Status: AC
  Filled 2021-11-30: qty 50

## 2021-11-30 MED ORDER — FENTANYL CITRATE (PF) 100 MCG/2ML IJ SOLN
INTRAMUSCULAR | Status: DC | PRN
  Administered 2021-11-30: 50 ug via INTRAVENOUS

## 2021-11-30 MED ORDER — ONDANSETRON HCL 4 MG/2ML IJ SOLN: 4.0000 mg | Freq: Four times a day (QID) | INTRAMUSCULAR | Status: DC | PRN

## 2021-11-30 MED ORDER — DIPHENHYDRAMINE HCL 50 MG/ML IJ SOLN: 50.0000 mg | Freq: Once | INTRAMUSCULAR | Status: DC | PRN

## 2021-11-30 MED ORDER — FAMOTIDINE 20 MG PO TABS: 40.0000 mg | ORAL_TABLET | Freq: Once | ORAL | Status: DC | PRN

## 2021-11-30 SURGICAL SUPPLY — 10 items
COVER SURGICAL LIGHT HANDLE (MISCELLANEOUS) ×2 IMPLANT
DERMABOND ADVANCED (GAUZE/BANDAGES/DRESSINGS)
DERMABOND ADVANCED .7 DNX12 (GAUZE/BANDAGES/DRESSINGS) IMPLANT
KIT PORT POWER 8FR ISP CVUE (Port) IMPLANT
PACK ANGIOGRAPHY (CUSTOM PROCEDURE TRAY) ×2 IMPLANT
PENCIL ELECTRO HAND CTR (MISCELLANEOUS) IMPLANT
SPONGE XRAY 4X4 16PLY STRL (MISCELLANEOUS) ×2 IMPLANT
SUT MNCRL AB 4-0 PS2 18 (SUTURE) IMPLANT
SUT VIC AB 3-0 SH 27 (SUTURE)
SUT VIC AB 3-0 SH 27X BRD (SUTURE) IMPLANT

## 2021-11-30 NOTE — Interval H&P Note (Signed)
History and Physical Interval Note:  11/30/2021 9:26 AM  Kerry Martinez  has presented today for surgery, with the diagnosis of Port Placement   Crohns disease.  The various methods of treatment have been discussed with the patient and family. After consideration of risks, benefits and other options for treatment, the patient has consented to  Procedure(s): PORTA CATH INSERTION (N/A) as a surgical intervention.  The patient's history has been reviewed, patient examined, no change in status, stable for surgery.  I have reviewed the patient's chart and labs.  Questions were answered to the patient's satisfaction.     Festus Barren

## 2021-11-30 NOTE — Op Note (Signed)
 VEIN AND VASCULAR SURGERY   OPERATIVE NOTE  DATE: 11/30/2021  PRE-OPERATIVE DIAGNOSIS: Crohn's disease, difficulty using her port  POST-OPERATIVE DIAGNOSIS: same as above  PROCEDURE: 1.   Ultrasound guidance for access of the port and injection of the Port-A-Cath with contrast  SURGEON: Festus Barren  ASSISTANT(S): None  ANESTHESIA: Local with moderate conscious sedation for approximately 19 using a total of 2 mg of Versed and 50 mcg of Fentanyl  ESTIMATED BLOOD LOSS: 3 cc  FINDING(S): 1.  Port was in adequate location and was accessed with ultrasound to ensure appropriate access.  Once it was accessed, it withdrew blood well and flushed easily with sterile saline.  I did an injection with 5 cc of contrast which showed the port to be widely patent with good flow into the right atrium.   INDICATIONS:   Kerry Martinez is a 35 y.o. female who presents with a difficult to access and poorly functioning right jugular Port-A-Cath.  We are asked to revise this if needed or at least evaluated to make sure the port was patent.  Risks and benefits were discussed and informed consent was obtained.  DESCRIPTION: After obtaining full informed written consent, the patient was brought back to the operating room and placed supine upon the operating table.  The patient was prepped and draped in the standard fashion.  The area was anesthetized copiously with 1% lidocaine.  On initial assessment, the port had a slight tilt to it but it was pinned down to the chest wall between fingers and then accessed under ultrasound guidance to ensure that we were squarely in the port which was marked for orientation with a CT marking.  The port then withdrew dark red venous blood and withdrew well and then was flushed with 20 cc of heparinized saline.  For full assessment, an injection was performed with 5 cc of contrast showing the port to be widely patent with excellent flow into the right atrium. At this  point, we elected to complete the procedure.  The Huber needle was removed after heparinizing the port.  The patient was taken to the recovery room in stable condition.   COMPLICATIONS: None  CONDITION: Stable  Festus Barren  11/30/2021, 11:04 AM    This note was created with Dragon Medical transcription system. Any errors in dictation are purely unintentional.

## 2021-12-03 DIAGNOSIS — F4312 Post-traumatic stress disorder, chronic: Secondary | ICD-10-CM | POA: Diagnosis not present

## 2021-12-03 DIAGNOSIS — F329 Major depressive disorder, single episode, unspecified: Secondary | ICD-10-CM | POA: Diagnosis not present

## 2021-12-03 NOTE — H&P (Signed)
[facility] Admission History & Physical  MRN : [id number]  Kerry Martinez is a 35 y.o. (1986-01-15) female who presents with chief complaint of No chief complaint on file. Marland Kitchen  History of Present Illness: We are asked to see the patient Kerry Galas, NP for a nonfunctional Port-A-Cath.  The patient has longstanding Crohn's disease and gets infusion therapies at times.  Recently, they were unable to use her port.  She is sent for evaluation.  No current facility-administered medications for this encounter.   Current Outpatient Medications  Medication Sig Dispense Refill   budesonide (PULMICORT) 180 MCG/ACT inhaler Inhale 2 puffs into the lungs 2 (two) times daily. For shortness of breath. 1 Inhaler 5   Cetirizine HCl (ZYRTEC ALLERGY) 10 MG CAPS Take 2 capsules by mouth daily.     famotidine (PEPCID) 20 MG tablet Take 20 mg by mouth 2 (two) times daily.     gabapentin (NEURONTIN) 600 MG tablet Take 1 tablet (600 mg total) by mouth daily. 90 tablet 1   levocetirizine (XYZAL) 5 MG tablet Take 5 mg by mouth every evening.     levothyroxine (SYNTHROID) 112 MCG tablet      montelukast (SINGULAIR) 10 MG tablet Take 1 tablet (10 mg total) by mouth at bedtime. 90 tablet 3   Multiple Vitamins-Minerals (MULTIVITAMINS THER. W/MINERALS) TABS Take 1 tablet by mouth daily.     prazosin (MINIPRESS) 2 MG capsule Take 3 capsules by mouth daily.     venlafaxine (EFFEXOR) 75 MG tablet 3 tablets daily     albuterol (VENTOLIN HFA) 108 (90 Base) MCG/ACT inhaler Inhale 2 puffs into the lungs. every 4 to 6 hours as needed     cholecalciferol (VITAMIN D) 1000 UNITS tablet Take 1,000 Units by mouth daily. (Patient not taking: Reported on 11/30/2021)     cyanocobalamin (,VITAMIN B-12,) 1000 MCG/ML injection Inject 1,000 mcg into the muscle every 30 (thirty) days.     doxepin (SINEQUAN) 10 MG capsule 1 to 3 tablets daily as needed for hives     EPINEPHrine 0.3 mg/0.3 mL IJ SOAJ injection See  admin instructions.     hydrocortisone 2.5 % ointment As needed for psoriasis     inFLIXimab (REMICADE) 100 MG injection Inject into the vein.     levonorgestrel (MIRENA) 20 MCG/DAY IUD 1 each by Intrauterine route once.     Methotrexate, PF, (RASUVO) 20 MG/0.4ML SOAJ On Fridays     Omalizumab (XOLAIR [place]) Inject into the skin. 1 150 mg syringes monthly     ondansetron (ZOFRAN) 4 MG tablet Take 4 mg by mouth 3 (three) times daily as needed.     traMADol (ULTRAM) 50 MG tablet Take 1 tablet by mouth every 8 (eight) hours as needed.     Vitamin D, Ergocalciferol, (DRISDOL) 1.25 MG (50000 UNIT) CAPS capsule Take 1 capsule (50,000 Units total) by mouth every 7 (seven) days. 12 capsule 1    Past Medical History:  Diagnosis Date   Allergy    Anemia    Asthma    Crohn's disease ([place])    Depression    Fibromyalgia    Migraine    Polycystic ovarian syndrome 12/27/2010   s/p endocrinology consult/[facility].   Psoriatic arthritis [facility])     Past Surgical History:  Procedure Laterality Date   Admission  11/10/12   coffee ground emesis, abdominal pain.  EGD with 3 gastric ulcers and HH.  s/p lab band reversal.  [facility].  GI consults, Bariatric  Surgery Consult.   COLONOSCOPY W/ BIOPSIES     ear graph  2004   ESOPHAGOGASTRODUODENOSCOPY  11/12/12   3 gastric ulcers, hiatal hernia.  [facility].   Lap band procedure  04/06/2011   Lap Band Reversal  11/12/12   [place] GASTRIC SLEEVE RESECTION  03/27/2013   MASTOIDECTOMY  1991   PARTIAL COLECTOMY  12/26/12   Crohn's disease resection; [facility]. [facility]   PORTA CATH INSERTION N/A 11/30/2021   Procedure: PORTA CATH INSERTION;  Surgeon: Algernon Huxley, MD;  Location: [facility];  Service: Cardiovascular;  Laterality: N/A;   TONSILLECTOMY  1997   TYMPANOSTOMY TUBE PLACEMENT  1990     Social History   Tobacco Use   Smoking status: Never   Smokeless tobacco: Never  Vaping Use   Vaping Use: Never used   Substance Use Topics   Alcohol use: Yes    Comment: socially   Drug use: No     Family History  Problem Relation Age of Onset   Diabetes Father    Hypertension Father    Hyperlipidemia Father    Other Sister        fibromyalgia   Fibromyalgia Sister    Cancer Mother        precancerous polyps in colon   Asthma Mother    Depression Mother    Diabetes Mother    Hyperlipidemia Mother    Hypertension Mother    Diabetes Maternal Grandmother    ALS Maternal Grandmother    Heart disease Maternal Grandfather    Kidney disease Maternal Grandfather    Diabetes Maternal Grandfather    Diabetes Paternal Grandmother    Diabetes Paternal Grandfather    Parkinson's disease Paternal Grandfather     Allergies  Allergen Reactions   Amoxicillin Hives   Cefprozil Hives   Cephalosporins Hives and Itching   Cat Hair Extract    Multihance [Gadobenate]    Sulfa Antibiotics Hives and Other (See Comments)     REVIEW OF SYSTEMS (Negative unless checked)  Constitutional: [] Weight loss  [] Fever  [] Chills Cardiac: [] Chest pain   [] Chest pressure   [] Palpitations   [] Shortness of breath when laying flat   [] Shortness of breath at rest   [] Shortness of breath with exertion. Vascular:  [] Pain in legs with walking   [] Pain in legs at rest   [] Pain in legs when laying flat   [] Claudication   [] Pain in feet when walking  [] Pain in feet at rest  [] Pain in feet when laying flat   [] History of DVT   [] Phlebitis   [] Swelling in legs   [] Varicose veins   [] Non-healing ulcers Pulmonary:   [] Uses home oxygen   [] Productive cough   [] Hemoptysis   [] Wheeze  [] COPD   [x] Asthma Neurologic:  [] Dizziness  [] Blackouts   [] Seizures   [] History of stroke   [] History of TIA  [] Aphasia   [] Temporary blindness   [] Dysphagia   [] Weakness or numbness in arms   [] Weakness or numbness in legs Musculoskeletal:  [] Arthritis   [] Joint swelling   [] Joint pain   [] Low back pain Hematologic:  [] Easy bruising  [] Easy bleeding    [] Hypercoagulable state   [x] Anemic  [] Hepatitis Gastrointestinal:  [] Blood in stool   [] Vomiting blood  [x] Gastroesophageal reflux/heartburn   [] Difficulty swallowing. Genitourinary:  [] Chronic kidney disease   [] Difficult urination  [] Frequent urination  [] Burning with urination   [] Blood in urine Skin:  [] Rashes   [] Ulcers   [] Wounds Psychological:  []   History of anxiety   [x]  History of major depression.  Physical Examination  Vitals:   11/30/21 0946 11/30/21 1115 11/30/21 1130 11/30/21 1145  BP: (!) 155/97 (!) 141/101 (!) 147/100 (!) 151/91  Pulse: 83 79 82 87  Resp: 15 19 12 11   Temp: 98.2 F (36.8 C)     TempSrc: Oral     SpO2: 98% 100% 100% 100%  Weight: (!) 163.3 kg     Height: 5' 5.5" (1.664 m)      Body mass index is 59 kg/m. Gen: WD/WN, NAD. obese Head: [place]/AT, No temporalis wasting.  Ear/Nose/Throat: Hearing grossly intact, nares w/o erythema or drainage, oropharynx w/o Erythema/Exudate,  Eyes: Conjunctiva clear, sclera non-icteric Neck: Trachea midline.  No JVD.  Pulmonary:  Good air movement, respirations not labored, no use of accessory muscles.  Cardiac: RRR, normal S1, S2. Vascular:  Vessel Right Left  Radial Palpable Palpable           Musculoskeletal: M/S 5/5 throughout.  Extremities without ischemic changes.  No deformity or atrophy.  Neurologic: Sensation grossly intact in extremities.  Symmetrical.  Speech is fluent. Motor exam as listed above. Psychiatric: Judgment intact, Mood & affect appropriate for pt's clinical situation. Dermatologic: No rashes or ulcers noted.  No cellulitis or open wounds.      CBC Lab Results  Component Value Date   WBC 6.2 08/25/2021   HGB 12.6 08/25/2021   HCT 38.2 08/25/2021   MCV 85.5 08/25/2021   PLT 358.0 08/25/2021    BMET    Component Value Date/Time   NA 138 08/25/2021 0911   K 4.5 08/25/2021 0911   CL 102 08/25/2021 0911   CO2 29 08/25/2021 0911   GLUCOSE 76 08/25/2021 0911   BUN 11 08/25/2021 0911    CREATININE 0.66 08/25/2021 0911   CREATININE 0.70 12/23/2014 1851   CALCIUM 9.4 08/25/2021 0911   GFRNONAA >89 08/05/2014 0939   GFRAA >89 08/05/2014 0939   CrCl cannot be calculated (Patient's most recent lab result is older than the maximum 21 days allowed.).  COAG No results found for: INR, PROTIME  Radiology PERIPHERAL VASCULAR CATHETERIZATION  Result Date: 11/30/2021 See surgical note for result.    Assessment/Plan 1.  Dysfunction of venous access device.  Her outpatient clinic has been unable to use her port successfully.  We will evaluate this today with a port check and possible revision. 2.  Crohn's disease.  Gets infusions and when the reason she needs venous access.    10/05/2014, MD  12/03/2021 8:50 AM

## 2021-12-15 DIAGNOSIS — F329 Major depressive disorder, single episode, unspecified: Secondary | ICD-10-CM | POA: Diagnosis not present

## 2021-12-15 DIAGNOSIS — F4312 Post-traumatic stress disorder, chronic: Secondary | ICD-10-CM | POA: Diagnosis not present

## 2021-12-18 DIAGNOSIS — L4050 Arthropathic psoriasis, unspecified: Secondary | ICD-10-CM | POA: Diagnosis not present

## 2021-12-18 DIAGNOSIS — K50019 Crohn's disease of small intestine with unspecified complications: Secondary | ICD-10-CM | POA: Diagnosis not present

## 2021-12-18 DIAGNOSIS — Z6841 Body Mass Index (BMI) 40.0 and over, adult: Secondary | ICD-10-CM | POA: Diagnosis not present

## 2021-12-25 DIAGNOSIS — Z20822 Contact with and (suspected) exposure to covid-19: Secondary | ICD-10-CM | POA: Diagnosis not present

## 2021-12-28 ENCOUNTER — Ambulatory Visit
Admission: EM | Admit: 2021-12-28 | Discharge: 2021-12-28 | Disposition: A | Discharge status: Home / Self Care | Payer: BC Managed Care – PPO | Attending: Physician Assistant | Admitting: Physician Assistant

## 2021-12-28 ENCOUNTER — Encounter: Payer: Self-pay | Admitting: Emergency Medicine

## 2021-12-28 ENCOUNTER — Other Ambulatory Visit: Payer: Self-pay

## 2021-12-28 DIAGNOSIS — J069 Acute upper respiratory infection, unspecified: Secondary | ICD-10-CM

## 2021-12-28 MED ORDER — OSELTAMIVIR PHOSPHATE 75 MG PO CAPS: 75.0000 mg | ORAL_CAPSULE | Freq: Two times a day (BID) | ORAL | 0 refills | Status: DC

## 2021-12-28 NOTE — ED Provider Notes (Signed)
[facility]    CSN: [id number] Arrival date & time: 12/28/21  1020      History   Chief Complaint Chief Complaint  Patient presents with   Influenza    HPI Kerry Martinez is a 36 y.o. female.   Patient here today for evaluation of high fever, sore throat, nasal congestion, chest congestion, body aches, and headache that started 5 days ago. She reports symptoms do not seem to be improving. She has tried mucinex fast max which does seem to help with fever somewhat.   The history is provided by the patient.  Influenza Presenting symptoms: cough, fever, headache and sore throat   Presenting symptoms: no diarrhea, no nausea, no shortness of breath and no vomiting   Associated symptoms: nasal congestion   Associated symptoms: no ear pain    Past Medical History:  Diagnosis Date   Allergy    Anemia    Asthma    Crohn's disease ([place])    Depression    Fibromyalgia    Migraine    Polycystic ovarian syndrome 12/27/2010   s/p endocrinology consult/[facility].   Psoriatic arthritis [facility])     Patient Active Problem List   Diagnosis Date Noted   Acquired hypothyroidism 08/25/2021   Fibromyalgia 08/25/2021   Psoriatic arthritis ([place]) 08/25/2021   Fatigue 08/25/2021   Preventative health care 08/25/2021   Anemia, iron deficiency 06/25/2014   Insomnia 06/25/2014   H/O surgical procedure 07/13/2013   Anemia, unspecified 01/29/2013   Vitamin B12 deficiency 01/29/2013   Folate deficiency 01/29/2013   Vitamin D deficiency 01/29/2013   Peptic ulcer disease 11/27/2012   Abdominal pain 11/27/2012   Coffee ground emesis 11/27/2012   Depression 11/27/2012   Nausea and vomiting 11/27/2012   Need for prophylactic vaccination against Streptococcus pneumoniae (pneumococcus) 11/27/2012   Crohn's disease ([place]) 09/20/2012   Morbid obesity ([place]) 08/07/2012   Bilateral polycystic ovarian syndrome 08/07/2012   Lapband APL April 2012 03/02/2012   MRSA (methicillin resistant staph  aureus) culture positive 06/25/2011   Asthma 06/25/2011   Migraines 06/25/2011   Family history of breast cancer 06/25/2011    Past Surgical History:  Procedure Laterality Date   Admission  11/10/12   coffee ground emesis, abdominal pain.  EGD with 3 gastric ulcers and HH.  s/p lab band reversal.  [facility].  GI consults, Bariatric Surgery Consult.   COLONOSCOPY W/ BIOPSIES     ear graph  2004   ESOPHAGOGASTRODUODENOSCOPY  11/12/12   3 gastric ulcers, hiatal hernia.  [facility].   Lap band procedure  04/06/2011   Lap Band Reversal  11/12/12   [place] GASTRIC SLEEVE RESECTION  03/27/2013   MASTOIDECTOMY  1991   PARTIAL COLECTOMY  12/26/12   Crohn's disease resection; [place]. [facility]   PORTA CATH INSERTION N/A 11/30/2021   Procedure: PORTA CATH INSERTION;  Surgeon: Algernon Huxley, MD;  Location: [facility];  Service: Cardiovascular;  Laterality: N/A;   TONSILLECTOMY  1997   TYMPANOSTOMY [place]    OB History   No obstetric history on file.      Home Medications    Prior to Admission medications   Medication Sig Start Date End Date Taking? Authorizing Provider  oseltamivir (TAMIFLU) 75 MG capsule Take 1 capsule (75 mg total) by mouth every 12 (twelve) hours. 12/28/21  Yes Francene Finders, PA-C  albuterol (VENTOLIN HFA) 108 (90 Base) MCG/ACT inhaler Inhale 2 puffs into the lungs. every  4 to 6 hours as needed 12/31/18   [provider]  budesonide (PULMICORT) 180 MCG/ACT inhaler Inhale 2 puffs into the lungs 2 (two) times daily. For shortness of breath. 12/19/13   Theda Sers, PA-C  Cetirizine HCl (ZYRTEC ALLERGY) 10 MG CAPS Take 2 capsules by mouth daily. 10/11/20   [provider]  cholecalciferol (VITAMIN D) 1000 UNITS tablet Take 1,000 Units by mouth daily. Patient not taking: Reported on 11/30/2021    [provider]  cyanocobalamin (,VITAMIN B-12,) 1000 MCG/ML injection Inject 1,000 mcg into the  muscle every 30 (thirty) days. 06/01/21   [provider]  doxepin (SINEQUAN) 10 MG capsule 1 to 3 tablets daily as needed for hives 04/17/20   [provider]  EPINEPHrine 0.3 mg/0.3 mL IJ SOAJ injection See admin instructions. 11/19/19   [provider]  famotidine (PEPCID) 20 MG tablet Take 20 mg by mouth 2 (two) times daily.    [provider]  gabapentin (NEURONTIN) 600 MG tablet Take 1 tablet (600 mg total) by mouth daily. 10/14/21   Michela Pitcher, NP  hydrocortisone 2.5 % ointment As needed for psoriasis 09/10/20   [provider]  inFLIXimab (REMICADE) 100 MG injection Inject into the vein.    [provider]  levocetirizine (XYZAL) 5 MG tablet Take 5 mg by mouth every evening.    [provider]  levonorgestrel (MIRENA) 20 MCG/DAY IUD 1 each by Intrauterine route once.    [provider]  levothyroxine (SYNTHROID) 112 MCG tablet     [provider]  Methotrexate, PF, (RASUVO) 20 MG/0.4ML SOAJ On Fridays 12/22/18   [provider]  montelukast (SINGULAIR) 10 MG tablet Take 1 tablet (10 mg total) by mouth at bedtime. 09/23/21   Michela Pitcher, NP  Multiple Vitamins-Minerals (MULTIVITAMINS THER. W/MINERALS) TABS Take 1 tablet by mouth daily.    [provider]  Omalizumab [place] Right [place]) Inject into the skin. 1 150 mg syringes monthly    [provider]  ondansetron (ZOFRAN) 4 MG tablet Take 4 mg by mouth 3 (three) times daily as needed. 04/21/21   [provider]  prazosin (MINIPRESS) 2 MG capsule Take 3 capsules by mouth daily. 08/20/21   [provider]  traMADol (ULTRAM) 50 MG tablet Take 1 tablet by mouth every 8 (eight) hours as needed. 01/02/21   [provider]  venlafaxine (EFFEXOR) 75 MG tablet 3 tablets daily    [provider]  Vitamin D, Ergocalciferol, (DRISDOL) 1.25 MG (50000 UNIT) CAPS capsule Take 1 capsule (50,000 Units total) by mouth every 7  (seven) days. 08/27/21   Michela Pitcher, NP    Family History Family History  Problem Relation Age of Onset   Diabetes Father    Hypertension Father    Hyperlipidemia Father    Other Sister        fibromyalgia   Fibromyalgia Sister    Cancer Mother        precancerous polyps in colon   Asthma Mother    Depression Mother    Diabetes Mother    Hyperlipidemia Mother    Hypertension Mother    Diabetes Maternal Grandmother    ALS Maternal Grandmother    Heart disease Maternal Grandfather    Kidney disease Maternal Grandfather    Diabetes Maternal Grandfather    Diabetes Paternal Grandmother    Diabetes Paternal Grandfather    Parkinson's disease Paternal Grandfather     Social History Social History  Tobacco Use   Smoking status: Never   Smokeless tobacco: Never  Vaping Use   Vaping Use: Never used  Substance Use Topics   Alcohol use: Yes    Comment: socially   Drug use: No     Allergies   Amoxicillin, Cefprozil, Cephalosporins, Cat hair extract, Multihance [gadobenate], and Sulfa antibiotics   Review of Systems Review of Systems  Constitutional:  Positive for fever.  HENT:  Positive for congestion and sore throat. Negative for ear pain.   Eyes:  Negative for discharge and redness.  Respiratory:  Positive for cough. Negative for shortness of breath and wheezing.   Gastrointestinal:  Negative for abdominal pain, diarrhea, nausea and vomiting.  Neurological:  Positive for headaches.    Physical Exam Triage Vital Signs ED Triage Vitals [12/28/21 1054]  Enc Vitals Group     BP (!) 137/95     Pulse Rate 85     Resp 16     Temp 97.8 F (36.6 C)     Temp Source Oral     SpO2 96 %     Weight      Height      Head Circumference      Peak Flow      Pain Score 6     Pain Loc      Pain Edu?      Excl. in [place]?    No data found.  Updated Vital Signs BP (!) 137/95 (BP Location: Left Arm)    Pulse 85    Temp 97.8 F (36.6 C) (Oral)    Resp 16    LMP  11/30/2021    SpO2 96%      Physical Exam Vitals and nursing note reviewed.  Constitutional:      General: She is not in acute distress.    Appearance: Normal appearance. She is not ill-appearing.  HENT:     Head: Normocephalic and atraumatic.     Right Ear: Tympanic membrane normal.     Left Ear: Tympanic membrane normal.     Nose: Congestion present.     Mouth/Throat:     Mouth: Mucous membranes are moist.     Pharynx: No oropharyngeal exudate or posterior oropharyngeal erythema.  Eyes:     Conjunctiva/sclera: Conjunctivae normal.  Cardiovascular:     Rate and Rhythm: Normal rate and regular rhythm.     Heart sounds: Normal heart sounds. No murmur heard. Pulmonary:     Effort: Pulmonary effort is normal. No respiratory distress.     Breath sounds: Normal breath sounds. No wheezing, rhonchi or rales.  Skin:    General: Skin is warm and dry.  Neurological:     Mental Status: She is alert.  Psychiatric:        Mood and Affect: Mood normal.        Thought Content: Thought content normal.     UC Treatments / Results  Labs (all labs ordered are listed, but only abnormal results are displayed) Labs Reviewed  COVID-19, FLU A+B NAA    EKG   Radiology No results found.  Procedures Procedures (including critical care time)  Medications Ordered in UC Medications - No data to display  Initial Impression / Assessment and Plan / UC Course  I have reviewed the triage vital signs and the nursing notes.  Pertinent labs & imaging results that were available during my care of the patient were reviewed by me and considered in my medical decision making (see chart for  details).    Suspect viral etiology of symptoms. Will screen for covid and flu. Discussed that technically she is outside of treatment window for flu but given continued symptoms will trial tamiflu. Recommended follow up with any further concerns.   Final Clinical Impressions(s) / UC Diagnoses   Final  diagnoses:  Acute upper respiratory infection   Discharge Instructions   None    ED Prescriptions     Medication Sig Dispense Auth. Provider   oseltamivir (TAMIFLU) 75 MG capsule Take 1 capsule (75 mg total) by mouth every 12 (twelve) hours. 10 capsule Francene Finders, PA-C      PDMP not reviewed this encounter.   Francene Finders, PA-C 12/28/21 1205

## 2021-12-28 NOTE — ED Triage Notes (Signed)
Day 5 of high fever, sore throat, cough, nasal and chest congestion, body aches, headache. Denies N/V/D. Questioning if she has the flu

## 2021-12-29 LAB — COVID-19, FLU A+B NAA
Influenza A, NAA: DETECTED — AB
Influenza B, NAA: NOT DETECTED
SARS-CoV-2, NAA: NOT DETECTED

## 2022-01-11 DIAGNOSIS — F4312 Post-traumatic stress disorder, chronic: Secondary | ICD-10-CM | POA: Diagnosis not present

## 2022-01-11 DIAGNOSIS — F329 Major depressive disorder, single episode, unspecified: Secondary | ICD-10-CM | POA: Diagnosis not present

## 2022-01-12 DIAGNOSIS — K5010 Crohn's disease of large intestine without complications: Secondary | ICD-10-CM | POA: Diagnosis not present

## 2022-01-12 DIAGNOSIS — Z79899 Other long term (current) drug therapy: Secondary | ICD-10-CM | POA: Diagnosis not present

## 2022-01-14 DIAGNOSIS — F329 Major depressive disorder, single episode, unspecified: Secondary | ICD-10-CM | POA: Diagnosis not present

## 2022-01-14 DIAGNOSIS — F4323 Adjustment disorder with mixed anxiety and depressed mood: Secondary | ICD-10-CM | POA: Diagnosis not present

## 2022-01-14 DIAGNOSIS — F4312 Post-traumatic stress disorder, chronic: Secondary | ICD-10-CM | POA: Diagnosis not present

## 2022-01-25 DIAGNOSIS — F329 Major depressive disorder, single episode, unspecified: Secondary | ICD-10-CM | POA: Diagnosis not present

## 2022-01-25 DIAGNOSIS — F4323 Adjustment disorder with mixed anxiety and depressed mood: Secondary | ICD-10-CM | POA: Diagnosis not present

## 2022-01-25 DIAGNOSIS — F4312 Post-traumatic stress disorder, chronic: Secondary | ICD-10-CM | POA: Diagnosis not present

## 2022-02-05 ENCOUNTER — Encounter: Payer: Self-pay | Admitting: Nurse Practitioner

## 2022-02-05 ENCOUNTER — Other Ambulatory Visit: Payer: Self-pay | Admitting: Nurse Practitioner

## 2022-02-05 MED ORDER — LEVOTHYROXINE SODIUM 112 MCG PO TABS: 112.0000 ug | ORAL_TABLET | Freq: Every day | ORAL | 1 refills | Status: DC

## 2022-02-11 DIAGNOSIS — F4312 Post-traumatic stress disorder, chronic: Secondary | ICD-10-CM | POA: Diagnosis not present

## 2022-02-11 DIAGNOSIS — F4323 Adjustment disorder with mixed anxiety and depressed mood: Secondary | ICD-10-CM | POA: Diagnosis not present

## 2022-02-11 DIAGNOSIS — F329 Major depressive disorder, single episode, unspecified: Secondary | ICD-10-CM | POA: Diagnosis not present

## 2022-02-23 ENCOUNTER — Encounter: Payer: Self-pay | Admitting: Nurse Practitioner

## 2022-02-23 ENCOUNTER — Ambulatory Visit (INDEPENDENT_AMBULATORY_CARE_PROVIDER_SITE_OTHER): Payer: BC Managed Care – PPO | Admitting: Nurse Practitioner

## 2022-02-23 ENCOUNTER — Other Ambulatory Visit: Payer: Self-pay

## 2022-02-23 VITALS — BP 126/72 | HR 96 | Temp 97.3°F | Resp 12 | Ht 65.5 in | Wt 378.5 lb

## 2022-02-23 DIAGNOSIS — K5090 Crohn's disease, unspecified, without complications: Secondary | ICD-10-CM

## 2022-02-23 DIAGNOSIS — E559 Vitamin D deficiency, unspecified: Secondary | ICD-10-CM | POA: Diagnosis not present

## 2022-02-23 DIAGNOSIS — E538 Deficiency of other specified B group vitamins: Secondary | ICD-10-CM | POA: Diagnosis not present

## 2022-02-23 DIAGNOSIS — G629 Polyneuropathy, unspecified: Secondary | ICD-10-CM | POA: Diagnosis not present

## 2022-02-23 DIAGNOSIS — R7303 Prediabetes: Secondary | ICD-10-CM | POA: Insufficient documentation

## 2022-02-23 DIAGNOSIS — H7292 Unspecified perforation of tympanic membrane, left ear: Secondary | ICD-10-CM | POA: Insufficient documentation

## 2022-02-23 LAB — COMPREHENSIVE METABOLIC PANEL
ALT: 18 U/L (ref 0–35)
AST: 17 U/L (ref 0–37)
Albumin: 4.1 g/dL (ref 3.5–5.2)
Alkaline Phosphatase: 82 U/L (ref 39–117)
BUN: 10 mg/dL (ref 6–23)
CO2: 29 mEq/L (ref 19–32)
Calcium: 9.1 mg/dL (ref 8.4–10.5)
Chloride: 104 mEq/L (ref 96–112)
Creatinine, Ser: 0.75 mg/dL (ref 0.40–1.20)
GFR: 104.77 mL/min (ref >60.00)
Glucose, Bld: 88 mg/dL (ref 70–99)
Potassium: 4.1 mEq/L (ref 3.5–5.1)
Sodium: 139 mEq/L (ref 135–145)
Total Bilirubin: 0.4 mg/dL (ref 0.2–1.2)
Total Protein: 7.3 g/dL (ref 6.0–8.3)

## 2022-02-23 LAB — CBC
HCT: 38.4 % (ref 36.0–46.0)
Hemoglobin: 12.5 g/dL (ref 12.0–15.0)
MCHC: 32.5 g/dL (ref 30.0–36.0)
MCV: 85.1 fl (ref 78.0–100.0)
Platelets: 375 10*3/uL (ref 150.0–400.0)
RBC: 4.51 Mil/uL (ref 3.87–5.11)
RDW: 13.7 % (ref 11.5–15.5)
WBC: 6.9 10*3/uL (ref 4.0–10.5)

## 2022-02-23 LAB — VITAMIN B12: Vitamin B-12: 453 pg/mL (ref 211–911)

## 2022-02-23 LAB — LIPID PANEL
Cholesterol: 186 mg/dL (ref 0–200)
HDL: 54.3 mg/dL (ref >39.00)
LDL Cholesterol: 107 mg/dL — ABNORMAL HIGH (ref 0–99)
NonHDL: 131.99
Total CHOL/HDL Ratio: 3
Triglycerides: 123 mg/dL (ref 0.0–149.0)
VLDL: 24.6 mg/dL (ref 0.0–40.0)

## 2022-02-23 LAB — HEMOGLOBIN A1C: Hgb A1c MFr Bld: 5.7 % (ref 4.6–6.5)

## 2022-02-23 LAB — VITAMIN D 25 HYDROXY (VIT D DEFICIENCY, FRACTURES): VITD: 45.37 ng/mL (ref 30.00–100.00)

## 2022-02-23 MED ORDER — ZYRTEC ALLERGY 10 MG PO CAPS: 2.0000 | ORAL_CAPSULE | Freq: Every day | ORAL | 0 refills | Status: DC

## 2022-02-23 NOTE — Progress Notes (Signed)
Established Patient Office Visit  Subjective:  Patient ID: Kerry Martinez, female    DOB: Jan 28, 1986  Age: 36 y.o. 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 She does have pain in bilateral hand pain that alternates and bilateral hand numbness that is intermitten. States confirmed by skin biopsy. Would like referal to neurology  Remicade every 8 weeks from GI. Currently managed by Dr. Silvano Bilis, [facility]  Asthma allegery Dr Sherri Sear.   Vitamin D: was on 10,000 iu daily and switched to once weekly and will recheck labs today   Seen vascular and re opened the [place]. Every 8 weeks for [place]. Currently sees Dr. Garth Bigness at [facility] last visit was in 01/2022.  Dr. Tenny Craw - Asthma and allergy Dr. Silvano Bilis - GI Dr. Wyn Quaker - Vascular for port assessment Dr. Cristobal Goldmann - Rheumotolgy GYN: Dr. Charlesetta Shanks. Dr Perley Jain ENT in the past Past Medical History:  Diagnosis Date   Allergy    Anemia    Asthma    Crohn's disease (HCC)    Depression    Fibromyalgia    Migraine    Polycystic ovarian syndrome 12/27/2010   s/p endocrinology consult/[facility].   Psoriatic arthritis [facility])     Past Surgical History:  Procedure Laterality Date   Admission  11/10/12   coffee ground emesis, abdominal pain.  EGD with 3 gastric ulcers and HH.  s/p lab band reversal.  [facility].  GI consults, Bariatric Surgery Consult.   COLONOSCOPY W/ BIOPSIES     ear graph  2004   ESOPHAGOGASTRODUODENOSCOPY  11/12/12   3 gastric ulcers, hiatal hernia.  [facility]   Lap band procedure  04/06/2011   Lap Band Reversal  11/12/12   [facility]   LAPAROSCOPIC GASTRIC SLEEVE RESECTION  03/27/2013   MASTOIDECTOMY  1991   PARTIAL COLECTOMY  12/26/12   Crohn's  disease resection; [facility]   PORTA CATH INSERTION N/A 11/30/2021   Procedure: PORTA CATH INSERTION;  Surgeon: Annice Needy, MD;  Location: [facility] INVASIVE CV LAB;  Service: Cardiovascular;  Laterality: N/A;   TONSILLECTOMY  1997   TYMPANOSTOMY TUBE PLACEMENT  1990    Family History  Problem Relation Age of Onset   Diabetes Father    Hypertension Father    Hyperlipidemia Father    Other Sister        fibromyalgia   Fibromyalgia Sister    Cancer Mother        precancerous polyps in colon   Asthma Mother    Depression Mother    Diabetes Mother    Hyperlipidemia Mother    Hypertension Mother    Diabetes Maternal Grandmother    ALS Maternal Grandmother    Heart disease Maternal Grandfather    Kidney disease Maternal Grandfather    Diabetes Maternal Grandfather    Diabetes Paternal Grandmother    Diabetes Paternal Grandfather    Parkinson's disease Paternal Grandfather     Social History   Socioeconomic History   Marital status: Single    Spouse name: Not on file   Number of children: Not on file   Years of education: Not on file   Highest education level: Not on file  Occupational History   Not  on file  Tobacco Use   Smoking status: Never   Smokeless tobacco: Never  Vaping Use   Vaping Use: Never used  Substance and Sexual Activity   Alcohol use: Yes    Comment: socially   Drug use: No   Sexual activity: Not Currently  Other Topics Concern   Not on file  Social History Narrative   Marital status: single; not dating.      Children: none      Lives: with parents.      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      Education: [employer] in fall 2014.      Tobacco: none      Alcohol: socially; rarely.      Drugs :none      Exercise: sporadic exercise due to Crohn's disease.  Walking three times per week.      Sexual activity: one partner.  Last sexual activity 2 years ago.  No STDs.      Seatbelt:   100% of time.      Sunscreen: SPF 40.   Social Determinants of Health   Financial Resource Strain: Not on file  Food Insecurity: Not on file  Transportation Needs: Not on file  Physical Activity: Not on file  Stress: Not on file  Social Connections: Not on file  Intimate Partner Violence: Not on file    Outpatient Medications Prior to Visit  Medication Sig Dispense Refill   albuterol (VENTOLIN HFA) 108 (90 Base) MCG/ACT inhaler Inhale 2 puffs into the lungs. every 4 to 6 hours as needed     budesonide (PULMICORT FLEXHALER) 180 MCG/ACT inhaler Inhale into the lungs.     budesonide (PULMICORT) 180 MCG/ACT inhaler Inhale 2 puffs into the lungs 2 (two) times daily. For shortness of breath. 1 Inhaler 5   Cetirizine HCl (ZYRTEC ALLERGY) 10 MG CAPS Take 2 capsules by mouth daily.     cyanocobalamin (,VITAMIN B-12,) 1000 MCG/ML injection Inject 1,000 mcg into the muscle every 30 (thirty) days.     doxepin (SINEQUAN) 10 MG capsule 1 to 3 tablets daily as needed for hives     EPINEPHrine 0.3 mg/0.3 mL IJ SOAJ injection See admin instructions.     famotidine (PEPCID) 20 MG tablet Take 20 mg by mouth 2 (two) times daily.     folic acid (FOLVITE) 1 MG tablet Take by mouth.     gabapentin (NEURONTIN) 600 MG tablet Take 1 tablet (600 mg total) by mouth daily. 90 tablet 1   gabapentin (NEURONTIN) 600 MG tablet Take by mouth.     hydrocortisone 2.5 % ointment As needed for psoriasis     inFLIXimab (REMICADE) 100 MG injection Inject into the vein.     levocetirizine (XYZAL) 5 MG tablet Take 5 mg by mouth every evening.     levonorgestrel (MIRENA) 20 MCG/DAY IUD 1 each by Intrauterine route once.     levonorgestrel (MIRENA) 20 MCG/DAY IUD by Intrauterine route.     levothyroxine (SYNTHROID) 112 MCG tablet Take 1 tablet (112 mcg total) by mouth daily before breakfast. 90 tablet 1   levothyroxine (SYNTHROID) 112 MCG tablet Take by mouth.     Methotrexate, PF, (RASUVO) 20 MG/0.4ML SOAJ On Fridays      Methotrexate, PF, (RASUVO) 20 MG/0.4ML SOAJ Inject into the skin.     montelukast (SINGULAIR) 10 MG tablet Take 1 tablet (10 mg total) by mouth at bedtime. 90 tablet 3  montelukast (SINGULAIR) 10 MG tablet Take by mouth.     Multiple Vitamins-Minerals (MULTIVITAMINS THER. W/MINERALS) TABS Take 1 tablet by mouth daily.     Omalizumab (XOLAIR [place]) Inject into the skin. 1 150 mg syringes monthly     ondansetron (ZOFRAN) 4 MG tablet Take 4 mg by mouth 3 (three) times daily as needed.     prazosin (MINIPRESS) 2 MG capsule Take 1 capsule by mouth daily.     prazosin (MINIPRESS) 5 MG capsule Take 5 mg by mouth at bedtime.     tacrolimus (PROTOPIC) 0.1 % ointment Apply topically daily as needed.     traMADol (ULTRAM) 50 MG tablet Take 1 tablet by mouth every 8 (eight) hours as needed.     venlafaxine (EFFEXOR) 75 MG tablet 3 tablets daily     Vitamin D, Ergocalciferol, (DRISDOL) 1.25 MG (50000 UNIT) CAPS capsule Take 1 capsule (50,000 Units total) by mouth every 7 (seven) days. 12 capsule 1   cholecalciferol (VITAMIN D) 1000 UNITS tablet Take 1,000 Units by mouth daily. (Patient not taking: Reported on 11/30/2021)     oseltamivir (TAMIFLU) 75 MG capsule Take 1 capsule (75 mg total) by mouth every 12 (twelve) hours. 10 capsule 0   No facility-administered medications prior to visit.    Allergies  Allergen Reactions   Amoxicillin Hives   Cefprozil Hives   Iodinated Contrast Media Anaphylaxis    Other reaction(s): Other (See Comments) Only on MRIs.   Abacavir Itching    Hives Hives   Cephalosporins Hives and Itching   Cat Hair Extract    Multihance [Gadobenate]    Sulfa Antibiotics Hives and Other (See Comments)    ROS Review of Systems  Constitutional:  Negative for chills, fatigue and fever.  Respiratory:  Negative for cough and shortness of breath.   Cardiovascular:  Negative for chest pain and leg swelling.  Gastrointestinal:  Positive for diarrhea. Negative for nausea and vomiting.        BM daily. WIthout blood in her stool   Genitourinary:  Negative for difficulty urinating.  Musculoskeletal:  Positive for arthralgias.  Neurological:  Positive for numbness. Negative for dizziness, weakness, light-headedness and headaches.  Psychiatric/Behavioral:  Negative for hallucinations, self-injury and suicidal ideas.      Objective:    Physical Exam Vitals and nursing note reviewed.  Constitutional:      Appearance: She is obese.  HENT:     Right Ear: Hearing, ear canal and external ear normal. There is no impacted cerumen.     Left Ear: Hearing, ear canal and external ear normal. There is no impacted cerumen. Tympanic membrane is perforated.     Mouth/Throat:     Mouth: Mucous membranes are moist.     Pharynx: Oropharynx is clear.  Neck:     Thyroid: No thyroid mass, thyromegaly or thyroid tenderness.  Cardiovascular:     Rate and Rhythm: Normal rate and regular rhythm.  Pulmonary:     Effort: Pulmonary effort is normal.     Breath sounds: Normal breath sounds.  Lymphadenopathy:     Cervical: No cervical adenopathy.  Skin:    General: Skin is warm.  Neurological:     Mental Status: She is alert.  Psychiatric:        Mood and Affect: Mood normal.        Behavior: Behavior normal.        Thought Content: Thought content normal.        Judgment: Judgment normal.  BP 126/72    Pulse 96    Temp (!) 97.3 F (36.3 C)    Resp 12    Ht 5' 5.5" (1.664 m)    Wt (!) 378 lb 8 oz (171.7 kg)    SpO2 99%    BMI 62.03 kg/m  Wt Readings from Last 3 Encounters:  02/23/22 (!) 378 lb 8 oz (171.7 kg)  11/30/21 (!) 360 lb (163.3 kg)  08/25/21 (!) 362 lb 8 oz (164.4 kg)     Health Maintenance Due  Topic Date Due   HIV Screening  Never done   Hepatitis C Screening  Never done   PAP SMEAR-Modifier  Never done   TETANUS/TDAP  09/26/2021    There are no preventive care reminders to display for this patient.  Lab Results  Component Value Date   TSH 1.42 08/25/2021    Lab Results  Component Value Date   WBC 6.2 08/25/2021   HGB 12.6 08/25/2021   HCT 38.2 08/25/2021   MCV 85.5 08/25/2021   PLT 358.0 08/25/2021   Lab Results  Component Value Date   NA 138 08/25/2021   K 4.5 08/25/2021   CO2 29 08/25/2021   GLUCOSE 76 08/25/2021   BUN 11 08/25/2021   CREATININE 0.66 08/25/2021   BILITOT 0.4 08/25/2021   ALKPHOS 81 08/25/2021   AST 16 08/25/2021   ALT 17 08/25/2021   PROT 6.9 08/25/2021   ALBUMIN 3.9 08/25/2021   CALCIUM 9.4 08/25/2021   GFR 115.85 08/25/2021   Lab Results  Component Value Date   CHOL 211 (H) 08/25/2021   Lab Results  Component Value Date   HDL 57.70 08/25/2021   Lab Results  Component Value Date   LDLCALC 124 (H) 08/25/2021   Lab Results  Component Value Date   TRIG 144.0 08/25/2021   Lab Results  Component Value Date   CHOLHDL 4 08/25/2021   Lab Results  Component Value Date   HGBA1C 5.7 08/25/2021      Assessment & Plan:   Problem List Items Addressed This Visit       Nervous and Auditory   Small fiber neuropathy - Primary    Previous healthcare provider.  States was diagnosed by skin biopsy.  Please see neurology but did not pursue any further treatment.  Patient is having intermittent bilateral hand pain and numbness will elect to be referred to a neurologist locally.  States that her mother sees a neurologist through [facility] and will obtain provider's name and get back to me.  Pending information will refer to neurology for further work-up.      Relevant Medications   gabapentin (NEURONTIN) 600 MG tablet   Perforation of left tympanic membrane    This dental finding on physical exam today.  Patient used to be followed by Dr. Hermina Barters through [facility] has had skin grafts to the right ear for perforated TM.  Ambulatory referral to Dr. Hermina Barters for further evaluation of perforated left TM.      Relevant Orders   Ambulatory referral to ENT     Other   Crohn's disease [facility])    Relevant Orders   Ambulatory referral to Gastroenterology   Vitamin B12 deficiency    Continued on monthly B12 injections.  Last time B12 level was checked it was borderline low.  Will recheck B12 level today in office.      Relevant Orders   Vitamin B12   Vitamin D deficiency    Historical  diagnosis.  Patient is to be maintained on 10,000 IUs of vitamin D daily.  She request to do 50,000 once weekly.  Will check vitamin D level in office today.      Relevant Orders   VITAMIN D 25 Hydroxy (Vit-D Deficiency, Fractures)   Morbid obesity (HCC)   Relevant Orders   Hemoglobin A1c   Lipid panel   Prediabetes    Last A1c was 5.7.  Pending lab results today      Relevant Orders   CBC   Comprehensive metabolic panel   Hemoglobin A1c   Lipid panel    Meds ordered this encounter  Medications   Cetirizine HCl (ZYRTEC ALLERGY) 10 MG CAPS    Sig: Take 2 capsules (20 mg total) by mouth daily.    Dispense:  30 capsule    Refill:  0    TEST PRINT    Follow-up: Return in about 6 months (around 08/23/2022) for CPE.  This visit occurred during the SARS-CoV-2 public health emergency.  Safety protocols were in place, including screening questions prior to the visit, additional usage of staff PPE, and extensive cleaning of exam room while observing appropriate contact time as indicated for disinfecting solutions.     Romilda Garret, NP

## 2022-02-23 NOTE — Patient Instructions (Signed)
Nice to see you today I will see you in 6 months, sooner if needed I will be in touch with lab results

## 2022-02-23 NOTE — Assessment & Plan Note (Signed)
Last A1c was 5.7.  Pending lab results today

## 2022-02-23 NOTE — Assessment & Plan Note (Signed)
Historical diagnosis.  Patient is to be maintained on 10,000 IUs of vitamin D daily.  She request to do 50,000 once weekly.  Will check vitamin D level in office today.

## 2022-02-23 NOTE — Assessment & Plan Note (Signed)
Continued on monthly B12 injections.  Last time B12 level was checked it was borderline low.  Will recheck B12 level today in office.

## 2022-02-23 NOTE — Assessment & Plan Note (Signed)
Previous healthcare provider.  States was diagnosed by skin biopsy.  Please see neurology but did not pursue any further treatment.  Patient is having intermittent bilateral hand pain and numbness will elect to be referred to a neurologist locally.  States that her mother sees a neurologist through [facility] and will obtain provider's name and get back to me.  Pending information will refer to neurology for further work-up.

## 2022-02-23 NOTE — Assessment & Plan Note (Signed)
This dental finding on physical exam today.  Patient used to be followed by Dr. Maximino Sarin through [facility] has had skin grafts to the right ear for perforated TM.  Ambulatory referral to Dr. Maximino Sarin for further evaluation of perforated left TM.

## 2022-02-25 DIAGNOSIS — J3089 Other allergic rhinitis: Secondary | ICD-10-CM | POA: Diagnosis not present

## 2022-02-25 DIAGNOSIS — L501 Idiopathic urticaria: Secondary | ICD-10-CM | POA: Diagnosis not present

## 2022-02-25 DIAGNOSIS — H1045 Other chronic allergic conjunctivitis: Secondary | ICD-10-CM | POA: Diagnosis not present

## 2022-02-25 DIAGNOSIS — F4312 Post-traumatic stress disorder, chronic: Secondary | ICD-10-CM | POA: Diagnosis not present

## 2022-02-25 DIAGNOSIS — F329 Major depressive disorder, single episode, unspecified: Secondary | ICD-10-CM | POA: Diagnosis not present

## 2022-02-25 DIAGNOSIS — F4323 Adjustment disorder with mixed anxiety and depressed mood: Secondary | ICD-10-CM | POA: Diagnosis not present

## 2022-02-25 DIAGNOSIS — J4530 Mild persistent asthma, uncomplicated: Secondary | ICD-10-CM | POA: Diagnosis not present

## 2022-03-01 ENCOUNTER — Encounter: Payer: Self-pay | Admitting: *Deleted

## 2022-03-01 DIAGNOSIS — L501 Idiopathic urticaria: Secondary | ICD-10-CM | POA: Diagnosis not present

## 2022-03-04 DIAGNOSIS — H60311 Diffuse otitis externa, right ear: Secondary | ICD-10-CM | POA: Diagnosis not present

## 2022-03-04 DIAGNOSIS — H7292 Unspecified perforation of tympanic membrane, left ear: Secondary | ICD-10-CM | POA: Diagnosis not present

## 2022-03-05 DIAGNOSIS — F4323 Adjustment disorder with mixed anxiety and depressed mood: Secondary | ICD-10-CM | POA: Diagnosis not present

## 2022-03-05 DIAGNOSIS — F329 Major depressive disorder, single episode, unspecified: Secondary | ICD-10-CM | POA: Diagnosis not present

## 2022-03-05 DIAGNOSIS — F4312 Post-traumatic stress disorder, chronic: Secondary | ICD-10-CM | POA: Diagnosis not present

## 2022-03-15 DIAGNOSIS — F4312 Post-traumatic stress disorder, chronic: Secondary | ICD-10-CM | POA: Diagnosis not present

## 2022-03-15 DIAGNOSIS — F329 Major depressive disorder, single episode, unspecified: Secondary | ICD-10-CM | POA: Diagnosis not present

## 2022-03-18 DIAGNOSIS — K5010 Crohn's disease of large intestine without complications: Secondary | ICD-10-CM | POA: Diagnosis not present

## 2022-03-18 DIAGNOSIS — Z79899 Other long term (current) drug therapy: Secondary | ICD-10-CM | POA: Diagnosis not present

## 2022-03-19 DIAGNOSIS — K5090 Crohn's disease, unspecified, without complications: Secondary | ICD-10-CM | POA: Diagnosis not present

## 2022-03-22 DIAGNOSIS — F329 Major depressive disorder, single episode, unspecified: Secondary | ICD-10-CM | POA: Diagnosis not present

## 2022-03-22 DIAGNOSIS — F4312 Post-traumatic stress disorder, chronic: Secondary | ICD-10-CM | POA: Diagnosis not present

## 2022-03-25 DIAGNOSIS — H60311 Diffuse otitis externa, right ear: Secondary | ICD-10-CM | POA: Diagnosis not present

## 2022-04-06 ENCOUNTER — Other Ambulatory Visit: Payer: Self-pay | Admitting: Nurse Practitioner

## 2022-04-06 DIAGNOSIS — E559 Vitamin D deficiency, unspecified: Secondary | ICD-10-CM

## 2022-04-12 DIAGNOSIS — F4312 Post-traumatic stress disorder, chronic: Secondary | ICD-10-CM | POA: Diagnosis not present

## 2022-04-12 DIAGNOSIS — F329 Major depressive disorder, single episode, unspecified: Secondary | ICD-10-CM | POA: Diagnosis not present

## 2022-04-16 DIAGNOSIS — F329 Major depressive disorder, single episode, unspecified: Secondary | ICD-10-CM | POA: Diagnosis not present

## 2022-04-16 DIAGNOSIS — F4312 Post-traumatic stress disorder, chronic: Secondary | ICD-10-CM | POA: Diagnosis not present

## 2022-04-30 DIAGNOSIS — F329 Major depressive disorder, single episode, unspecified: Secondary | ICD-10-CM | POA: Diagnosis not present

## 2022-04-30 DIAGNOSIS — F4312 Post-traumatic stress disorder, chronic: Secondary | ICD-10-CM | POA: Diagnosis not present

## 2022-05-13 DIAGNOSIS — F4312 Post-traumatic stress disorder, chronic: Secondary | ICD-10-CM | POA: Diagnosis not present

## 2022-05-13 DIAGNOSIS — F329 Major depressive disorder, single episode, unspecified: Secondary | ICD-10-CM | POA: Diagnosis not present

## 2022-05-16 ENCOUNTER — Encounter: Payer: Self-pay | Admitting: Nurse Practitioner

## 2022-05-16 DIAGNOSIS — K5090 Crohn's disease, unspecified, without complications: Secondary | ICD-10-CM

## 2022-05-21 DIAGNOSIS — F329 Major depressive disorder, single episode, unspecified: Secondary | ICD-10-CM | POA: Diagnosis not present

## 2022-05-21 DIAGNOSIS — F4312 Post-traumatic stress disorder, chronic: Secondary | ICD-10-CM | POA: Diagnosis not present

## 2022-06-03 ENCOUNTER — Encounter: Payer: Self-pay | Admitting: Gastroenterology

## 2022-06-03 ENCOUNTER — Ambulatory Visit: Payer: BC Managed Care – PPO | Admitting: Gastroenterology

## 2022-06-03 ENCOUNTER — Other Ambulatory Visit: Payer: Self-pay

## 2022-06-03 VITALS — BP 136/95 | HR 87 | Temp 97.9°F | Ht 65.5 in | Wt 373.4 lb

## 2022-06-03 DIAGNOSIS — K50019 Crohn's disease of small intestine with unspecified complications: Secondary | ICD-10-CM | POA: Diagnosis not present

## 2022-06-03 NOTE — Progress Notes (Signed)
[name], MD [address]  [address]  [place], [place] [address]  Main: [phone]  Fax: [phone]    Gastroenterology Consultation  Referring Provider:     Eden Emms, NP Primary Care Physician:  Eden Emms, NP Primary Gastroenterologist:  Dr. [name] Reason for Consultation: Small bowel Crohn's        HPI:   Kerry Martinez is a 36 y.o. female referred by Dr. Toney Reil, Genene Churn, NP  for consultation & management of small bowel Crohn's.  Patient is diagnosed with ileal Crohn's in 01/2006.  She was on several rounds of prednisone during initial diagnosis, followed by Humira for about 3 years.  She underwent ileocecal resection with primary anastomosis due to stricture formation.  Humira was restarted postsurgery, however she was found to have antibodies against Humira and therefore it was discontinued.  Switched to Remicade approximately in 2015.  Patient has been on Remicade 5 mg/kg body weight every 8 weeks and reportedly patient has been doing well, in clinical remission.  She also reports that colonoscopy from 2022 performed at [facility] was normal.  Patient has a port in order to receive Remicade infusions.  Patient's primary GI doctor left [facility] and therefore patient switched her care to  GI.  Patient generally has 1-2 semiformed to formed bowel movements daily.  However, within last 2 to 3 weeks, she has been experiencing more loose stools and 3-4 times daily without any rectal bleeding.  She is also past due for Remicade infusion by 1 month.  Labs from 02/23/2022 including CBC, CMP were unremarkable.  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  Lives with her grandmother who is recently diagnosed with C. difficile and underwent FMT by rebyota  NSAIDs: None  Antiplts/Anticoagulants/Anti thrombotics: None  GI Procedures: Received colonoscopy in 01/2012 by [facility] gastroenterology Found to have moderate  inflammation in the terminal ileum.  Pathology report not available with me today  Past Medical History:  Diagnosis Date   Allergy    Anemia    Asthma    Crohn's disease (HCC)    Depression    Fibromyalgia    Migraine    Polycystic ovarian syndrome 12/27/2010   s/p endocrinology consult/[facility].   Psoriatic arthritis [facility])     Past Surgical History:  Procedure Laterality Date   Admission  11/10/12   coffee ground emesis, abdominal pain.  EGD with 3 gastric ulcers and HH.  s/p lab band reversal.  [facility].  GI consults, Bariatric Surgery Consult.   COLONOSCOPY W/ BIOPSIES     ear graph  2004   ESOPHAGOGASTRODUODENOSCOPY  11/12/12   3 gastric ulcers, hiatal hernia.  [facility].   Lap band procedure  04/06/2011   Lap Band Reversal  11/12/12   [facility]   LAPAROSCOPIC GASTRIC SLEEVE RESECTION  03/27/2013   MASTOIDECTOMY  1991   PARTIAL COLECTOMY  12/26/12   Crohn's disease resection; [facility]   PORTA CATH INSERTION N/A 11/30/2021   Procedure: PORTA CATH INSERTION;  Surgeon: Annice Needy, MD;  Location: [facility] INVASIVE CV LAB;  Service: Cardiovascular;  Laterality: N/A;   TONSILLECTOMY  1997   TYMPANOSTOMY TUBE PLACEMENT  1990    Current Outpatient Medications:    albuterol (VENTOLIN HFA) 108 (90 Base) MCG/ACT inhaler, Inhale 2 puffs into the lungs. every 4 to 6 hours as needed, Disp: , Rfl:    ARIPiprazole (ABILIFY) 5 MG tablet, Take 5  mg by mouth every morning., Disp: , Rfl:    budesonide (PULMICORT FLEXHALER) 180 MCG/ACT inhaler, Inhale into the lungs., Disp: , Rfl:    budesonide (PULMICORT) 180 MCG/ACT inhaler, Inhale 2 puffs into the lungs 2 (two) times daily. For shortness of breath., Disp: 1 Inhaler, Rfl: 5   cetirizine (ZYRTEC ALLERGY) 10 MG tablet, 1 tablet, Disp: , Rfl:    cyanocobalamin (,VITAMIN B-12,) 1000 MCG/ML injection, Inject 1,000 mcg into the muscle every 30 (thirty) days., Disp: , Rfl:    doxepin (SINEQUAN) 10 MG capsule, 1 capsule at  bedtime, Disp: , Rfl:    EPINEPHrine 0.3 mg/0.3 mL IJ SOAJ injection, See admin instructions., Disp: , Rfl:    famotidine (PEPCID) 40 MG tablet, Take 40 mg by mouth 2 (two) times daily., Disp: , Rfl:    fluticasone (FLONASE) 50 MCG/ACT nasal spray, SPRAY 1 TO 2 SPRAYS INTO EACH NOSTRIL ONCE A DAY, Disp: , Rfl:    folic acid (FOLVITE) 1 MG tablet, Take by mouth., Disp: , Rfl:    gabapentin (NEURONTIN) 300 MG capsule, gabapentin 300 mg capsule  Take 2 capsules every day by oral Martinez at bedtime., Disp: , Rfl:    hydrocortisone 2.5 % ointment, As needed for psoriasis, Disp: , Rfl:    inFLIXimab (REMICADE) 100 MG injection, Inject into the vein., Disp: , Rfl:    ketotifen (ZADITOR) 0.025 % ophthalmic solution, 1 drop into affected eye, Disp: , Rfl:    levocetirizine (XYZAL ALLERGY 24HR) 5 MG tablet, Xyzal 5 mg tablet  Take 1 tablet every day by oral Martinez., Disp: , Rfl:    levonorgestrel (MIRENA) 20 MCG/DAY IUD, 1 each by Intrauterine Martinez once., Disp: , Rfl:    levothyroxine (SYNTHROID) 100 MCG tablet, 1 tablet in the morning on an empty stomach, Disp: , Rfl:    Methotrexate, PF, (RASUVO) 20 MG/0.4ML SOAJ, See admin instructions., Disp: , Rfl:    Methylcobalamin (B12) 5000 MCG SUBL, See admin instructions., Disp: , Rfl:    montelukast (SINGULAIR) 10 MG tablet, Take by mouth., Disp: , Rfl:    Multiple Vitamins-Minerals (MULTIVITAMINS THER. W/MINERALS) TABS, Take 1 tablet by mouth daily., Disp: , Rfl:    Nutritional Supplements (VITAMIN D PLUS COFACTORS) TABS, See admin instructions., Disp: , Rfl:    omalizumab (XOLAIR) 150 MG/ML prefilled syringe, Inject into the skin., Disp: , Rfl:    ondansetron (ZOFRAN) 4 MG tablet, Take 4 mg by mouth 3 (three) times daily as needed., Disp: , Rfl:    prazosin (MINIPRESS) 2 MG capsule, Take 1 capsule by mouth daily., Disp: , Rfl:    prazosin (MINIPRESS) 5 MG capsule, Take 5 mg by mouth at bedtime., Disp: , Rfl:    tacrolimus (PROTOPIC) 0.1 % ointment, Apply  topically daily as needed., Disp: , Rfl:    traMADol (ULTRAM) 50 MG tablet, Take 1 tablet by mouth every 8 (eight) hours as needed., Disp: , Rfl:    venlafaxine (EFFEXOR) 75 MG tablet, 3 tablets daily, Disp: , Rfl:    Vitamin D, Ergocalciferol, (DRISDOL) 1.25 MG (50000 UNIT) CAPS capsule, TAKE 1 CAPSULE (50,000 UNITS TOTAL) BY MOUTH EVERY 7 (SEVEN) DAYS, Disp: 12 capsule, Rfl: 0    Family History  Problem Relation Age of Onset   Diabetes Father    Hypertension Father    Hyperlipidemia Father    Other Sister        fibromyalgia   Fibromyalgia Sister    Cancer Mother        precancerous polyps  in colon   Asthma Mother    Depression Mother    Diabetes Mother    Hyperlipidemia Mother    Hypertension Mother    Diabetes Maternal Grandmother    ALS Maternal Grandmother    Heart disease Maternal Grandfather    Kidney disease Maternal Grandfather    Diabetes Maternal Grandfather    Diabetes Paternal Grandmother    Diabetes Paternal Grandfather    Parkinson's disease Paternal Grandfather      Social History   Tobacco Use   Smoking status: Never   Smokeless tobacco: Never  Vaping Use   Vaping Use: Never used  Substance Use Topics   Alcohol use: Yes    Comment: socially   Drug use: No    Allergies as of 06/03/2022 - Review Complete 06/03/2022  Allergen Reaction Noted   Amoxicillin Hives 06/25/2011   Cefprozil Hives 06/25/2011   Iodinated contrast media Anaphylaxis 11/13/2020   Abacavir Itching 09/14/2019   Cephalosporins Hives and Itching 01/30/2012   Cat hair extract  08/25/2021   Multihance [gadobenate]  08/16/2013   Sulfa antibiotics Hives and Other (See Comments) 08/02/2018    Review of Systems:    All systems reviewed and negative except where noted in HPI.   Physical Exam:  BP (!) 136/95 (BP Location: Left Arm, Patient Position: Sitting, Cuff Size: Large)   Pulse 87   Temp 97.9 F (36.6 C) (Oral)   Ht 5' 5.5" (1.664 m)   Wt (!) 373 lb 6 oz (169.4 kg)   BMI  61.19 kg/m  No LMP recorded. (Menstrual status: IUD).  General:   Alert,  Well-developed, well-nourished, pleasant and cooperative in NAD Head:  Normocephalic and atraumatic. Eyes:  Sclera clear, no icterus.   Conjunctiva pink. Ears:  Normal auditory acuity. Nose:  No deformity, discharge, or lesions. Mouth:  No deformity or lesions,oropharynx pink & moist. Neck:  Supple; no masses or thyromegaly. Lungs:  Respirations even and unlabored.  Clear throughout to auscultation.   No wheezes, crackles, or rhonchi. No acute distress. Heart:  Regular rate and rhythm; no murmurs, clicks, rubs, or gallops. Abdomen:  Normal bowel sounds. Soft, morbidly obese, non-tender and non-distended without masses, hepatosplenomegaly or hernias noted.  No guarding or rebound tenderness.   Rectal: Not performed Msk:  Symmetrical without gross deformities. Good, equal movement & strength bilaterally. Pulses:  Normal pulses noted. Extremities:  No clubbing or edema.  No cyanosis. Neurologic:  Alert and oriented x3;  grossly normal neurologically. Skin:  Intact without significant lesions or rashes. No jaundice. Psych:  Alert and cooperative. Normal mood and affect.  Imaging Studies: None recently  Assessment and Plan:   Kerry Martinez is a 37 y.o. pleasant Caucasian female with morbid obesity, psoriatic arthritis, PCOS, depression, asthma, ileal Crohn's diagnosed in 2007, s/p ileocolonic resection, previously on Humira, discontinued due to antibody formation, switched to Remicade in 2015, has been maintained on Remicade 5 mg/kg body weight every 8 weeks.  Patient has report to administer Remicade  Small bowel Crohn's, s/p ileocolic resection Has increased bowel frequency as well as loose stools without any rectal bleeding Recommend to check fecal calprotectin levels and GI profile PCR to rule out infection Recommend to restart Remicade 5 mg/kg body weight every 8 weeks Check CBC, CMP, CRP Recommend to check  labs with every other infusion Discussed with patient regarding repeat colonoscopy based on the stool studies  IBD Health Maintenance  1.TB status: QuantiFERON gold 03/18/2022 negative 2. Anemia: None 3.Immunizations: Hep A and  B up to date, Influenza recommend annual influenza vaccine, prevnar 06/2016, pneumovax 11/2012, Varicella unknown, Zoster recommend Shingrix vaccine 4.Cancer screening I) Colon cancer/dysplasia surveillance:N/A II) Cervical cancer: Up-to-date III) Skin cancer - counseled about annual skin exam by dermatology and skin protection in summer using sun screen SPF > 50, clothing 5.Bone health Vitamin D status: Unknown Bone density testing: Unknown 5. Labs: Today 6. Smoking: Never smoked 7. NSAIDs and Antibiotics use: None  Follow up in 4 to 6 months   [name], MD

## 2022-06-04 ENCOUNTER — Encounter: Payer: Self-pay | Admitting: Gastroenterology

## 2022-06-09 DIAGNOSIS — K50919 Crohn's disease, unspecified, with unspecified complications: Secondary | ICD-10-CM | POA: Diagnosis not present

## 2022-06-11 ENCOUNTER — Encounter: Payer: Self-pay | Admitting: Gastroenterology

## 2022-06-11 ENCOUNTER — Telehealth: Payer: Self-pay

## 2022-06-11 NOTE — Telephone Encounter (Signed)
-----   Message from Rohini Reddy Vanga, MD sent at 06/11/2022  7:52 AM EDT ----- C Diff positive, recommend oral vancomycin 125mg PO every 6 hrs for 10 days  RV 

## 2022-06-11 NOTE — Telephone Encounter (Signed)
Voice message has been left for pt to call me back in regards to lab results and rx.  Vancomycin 125mg  PO q6h for 10 days [facility] [place]  Thanks, [place], [place] [employer]

## 2022-06-14 ENCOUNTER — Telehealth: Payer: Self-pay

## 2022-06-14 DIAGNOSIS — K5090 Crohn's disease, unspecified, without complications: Secondary | ICD-10-CM | POA: Diagnosis not present

## 2022-06-14 NOTE — Telephone Encounter (Signed)
Per Sherrilyn Rist with [facility] speciality Just keeping you posted her PA is still pending - will let you know once approved

## 2022-06-16 ENCOUNTER — Telehealth: Payer: Self-pay

## 2022-06-16 NOTE — Telephone Encounter (Signed)
-----   Message from Toney Reil, MD sent at 06/11/2022  7:52 AM EDT ----- C Diff positive, recommend oral vancomycin 125mg  PO every 6 hrs for 10 days  RV

## 2022-06-16 NOTE — Telephone Encounter (Signed)
LVM with patient to confirm she got her message regarding labs and rx for vacomycin that was sent on 16th for C-Diff.  Thanks, [place], [place]

## 2022-06-17 DIAGNOSIS — J4530 Mild persistent asthma, uncomplicated: Secondary | ICD-10-CM | POA: Diagnosis not present

## 2022-06-17 LAB — GI PROFILE, STOOL, PCR

## 2022-06-17 LAB — COMPREHENSIVE METABOLIC PANEL
ALT: 29 IU/L (ref 0–32)
AST: 28 IU/L (ref 0–40)
Albumin/Globulin Ratio: 1.4 (ref 1.2–2.2)
Albumin: 4.1 g/dL (ref 3.8–4.8)
Alkaline Phosphatase: 93 IU/L (ref 44–121)
BUN/Creatinine Ratio: 16 (ref 9–23)
BUN: 12 mg/dL (ref 6–20)
Bilirubin Total: 0.3 mg/dL (ref 0.0–1.2)
CO2: 23 mmol/L (ref 20–29)
Calcium: 9.1 mg/dL (ref 8.7–10.2)
Chloride: 101 mmol/L (ref 96–106)
Creatinine, Ser: 0.76 mg/dL (ref 0.57–1.00)
Globulin, Total: 2.9 g/dL (ref 1.5–4.5)
Glucose: 85 mg/dL (ref 70–99)
Potassium: 4.2 mmol/L (ref 3.5–5.2)
Sodium: 139 mmol/L (ref 134–144)
Total Protein: 7 g/dL (ref 6.0–8.5)
eGFR: 106 mL/min/{1.73_m2} (ref >59)

## 2022-06-17 LAB — CBC
Hematocrit: 39.2 % (ref 34.0–46.6)
Hemoglobin: 13.3 g/dL (ref 11.1–15.9)
MCH: 28.3 pg (ref 26.6–33.0)
MCHC: 33.9 g/dL (ref 31.5–35.7)
MCV: 83 fL (ref 79–97)
Platelets: 388 10*3/uL (ref 150–450)
RBC: 4.7 x10E6/uL (ref 3.77–5.28)
RDW: 13.2 % (ref 11.7–15.4)
WBC: 8.6 10*3/uL (ref 3.4–10.8)

## 2022-06-17 LAB — CALPROTECTIN, FECAL: Calprotectin, Fecal: 323 ug/g — ABNORMAL HIGH (ref 0–120)

## 2022-06-17 LAB — C-REACTIVE PROTEIN: CRP: 11 mg/L — ABNORMAL HIGH (ref 0–10)

## 2022-06-18 DIAGNOSIS — F329 Major depressive disorder, single episode, unspecified: Secondary | ICD-10-CM | POA: Diagnosis not present

## 2022-06-18 DIAGNOSIS — F4312 Post-traumatic stress disorder, chronic: Secondary | ICD-10-CM | POA: Diagnosis not present

## 2022-06-23 ENCOUNTER — Telehealth: Payer: Self-pay

## 2022-06-23 NOTE — Telephone Encounter (Signed)
Per Sherrilyn Rist she is approved  Notification: Prior Authorization Follow-Up: [address] [place] [employer] has been completed. Completed By: Rema Jasmine Patient: Kerry Martinez Physician: Lannette Donath, MD Care Team: Site: [place] Patient DOB: Aug 31, 1986 Primary RN: Primary Payor: [address] [employer] Secondary Payor: Remicade Copay Assist MM Prior authorization status is approved or otherwise ok to proceed. Comment: PA/Pre-D Status Check - APPROVAL Insurance: [employer] / Care Coordinators Method of Status: phone Phone Number: [phone] Agent Name: Byrd Hesselbach Drug Name/HCPC: Remicade / [id number] Other Codes/HCPC: M0867, [phone], [phone] Case #: [id number] Drug Units: 320 Other Code Units: n/a Dose/Frequency Specifications: 800 mg IV Q8W Effective Dates: 06/10/22 - 12/26/22 Branch Authorized: Teodoro Kil POS: home Comments Regarding Approval: n/a   Recipients: Dodie Radcliff ([email or web address]), Marchelle Gearing ([email or web address]), Thalia Party ([email or web address])  Use one of the links below to launch your preferred Infusion application where you can search for the patient referenced above:

## 2022-06-28 ENCOUNTER — Other Ambulatory Visit: Payer: Self-pay | Admitting: Primary Care

## 2022-06-28 DIAGNOSIS — E559 Vitamin D deficiency, unspecified: Secondary | ICD-10-CM

## 2022-06-30 DIAGNOSIS — K5090 Crohn's disease, unspecified, without complications: Secondary | ICD-10-CM | POA: Diagnosis not present

## 2022-07-05 DIAGNOSIS — F4312 Post-traumatic stress disorder, chronic: Secondary | ICD-10-CM | POA: Diagnosis not present

## 2022-07-05 DIAGNOSIS — F329 Major depressive disorder, single episode, unspecified: Secondary | ICD-10-CM | POA: Diagnosis not present

## 2022-07-07 DIAGNOSIS — F329 Major depressive disorder, single episode, unspecified: Secondary | ICD-10-CM | POA: Diagnosis not present

## 2022-07-07 DIAGNOSIS — F4312 Post-traumatic stress disorder, chronic: Secondary | ICD-10-CM | POA: Diagnosis not present

## 2022-07-07 NOTE — Telephone Encounter (Signed)
Per Sherrilyn Rist with [facility] infusion She just texted me back she isn't due until august so no worries - we will contact her closer to infusion

## 2022-07-12 NOTE — Telephone Encounter (Signed)
Kerry Martinez is scheduled for her dose on 8/31

## 2022-07-16 DIAGNOSIS — L501 Idiopathic urticaria: Secondary | ICD-10-CM | POA: Diagnosis not present

## 2022-07-30 DIAGNOSIS — F4312 Post-traumatic stress disorder, chronic: Secondary | ICD-10-CM | POA: Diagnosis not present

## 2022-07-30 DIAGNOSIS — F329 Major depressive disorder, single episode, unspecified: Secondary | ICD-10-CM | POA: Diagnosis not present

## 2022-08-02 DIAGNOSIS — F4312 Post-traumatic stress disorder, chronic: Secondary | ICD-10-CM | POA: Diagnosis not present

## 2022-08-02 DIAGNOSIS — F329 Major depressive disorder, single episode, unspecified: Secondary | ICD-10-CM | POA: Diagnosis not present

## 2022-08-04 ENCOUNTER — Other Ambulatory Visit: Payer: Self-pay | Admitting: Nurse Practitioner

## 2022-08-10 DIAGNOSIS — Z7960 Long term (current) use of unspecified immunomodulators and immunosuppressants: Secondary | ICD-10-CM | POA: Diagnosis not present

## 2022-08-10 DIAGNOSIS — L409 Psoriasis, unspecified: Secondary | ICD-10-CM | POA: Diagnosis not present

## 2022-08-10 DIAGNOSIS — K50019 Crohn's disease of small intestine with unspecified complications: Secondary | ICD-10-CM | POA: Diagnosis not present

## 2022-08-10 DIAGNOSIS — L4050 Arthropathic psoriasis, unspecified: Secondary | ICD-10-CM | POA: Diagnosis not present

## 2022-08-16 ENCOUNTER — Telehealth: Payer: Self-pay

## 2022-08-16 DIAGNOSIS — Z8619 Personal history of other infectious and parasitic diseases: Secondary | ICD-10-CM

## 2022-08-16 DIAGNOSIS — R197 Diarrhea, unspecified: Secondary | ICD-10-CM

## 2022-08-16 NOTE — Telephone Encounter (Signed)
Patient is having diarrhea for 1.5 weeks. She states she is having diarrhea at lest 5 times a day. She states it is any time she eat or drink. She states it has a c diff smell to it. She states she was positive for Cidiff back in 06/09/2022

## 2022-08-16 NOTE — Telephone Encounter (Signed)
Order GI profile and informed patient of this information

## 2022-08-19 DIAGNOSIS — L501 Idiopathic urticaria: Secondary | ICD-10-CM | POA: Diagnosis not present

## 2022-08-20 ENCOUNTER — Encounter: Payer: Self-pay | Admitting: Nurse Practitioner

## 2022-08-20 ENCOUNTER — Ambulatory Visit: Payer: BC Managed Care – PPO | Admitting: Nurse Practitioner

## 2022-08-20 VITALS — BP 138/86 | HR 112 | Temp 96.9°F | Resp 12 | Wt 384.1 lb

## 2022-08-20 DIAGNOSIS — R000 Tachycardia, unspecified: Secondary | ICD-10-CM | POA: Insufficient documentation

## 2022-08-20 DIAGNOSIS — I951 Orthostatic hypotension: Secondary | ICD-10-CM | POA: Diagnosis not present

## 2022-08-20 DIAGNOSIS — R197 Diarrhea, unspecified: Secondary | ICD-10-CM | POA: Diagnosis not present

## 2022-08-20 DIAGNOSIS — I43 Cardiomyopathy in diseases classified elsewhere: Secondary | ICD-10-CM

## 2022-08-20 DIAGNOSIS — Z8619 Personal history of other infectious and parasitic diseases: Secondary | ICD-10-CM | POA: Diagnosis not present

## 2022-08-20 LAB — CBC
HCT: 38.3 % (ref 36.0–46.0)
Hemoglobin: 12.6 g/dL (ref 12.0–15.0)
MCHC: 33 g/dL (ref 30.0–36.0)
MCV: 86.7 fl (ref 78.0–100.0)
Platelets: 374 10*3/uL (ref 150.0–400.0)
RBC: 4.41 Mil/uL (ref 3.87–5.11)
RDW: 13.8 % (ref 11.5–15.5)
WBC: 7.9 10*3/uL (ref 4.0–10.5)

## 2022-08-20 LAB — BASIC METABOLIC PANEL
BUN: 14 mg/dL (ref 6–23)
CO2: 29 mEq/L (ref 19–32)
Calcium: 9.5 mg/dL (ref 8.4–10.5)
Chloride: 103 mEq/L (ref 96–112)
Creatinine, Ser: 0.85 mg/dL (ref 0.40–1.20)
GFR: 89.85 mL/min (ref >60.00)
Glucose, Bld: 94 mg/dL (ref 70–99)
Potassium: 4.6 mEq/L (ref 3.5–5.1)
Sodium: 138 mEq/L (ref 135–145)

## 2022-08-20 LAB — TSH: TSH: 2.67 u[IU]/mL (ref 0.35–5.50)

## 2022-08-20 NOTE — Assessment & Plan Note (Signed)
Patient was tachycardic in office worse with orthostatic vitals.  Likely fluid deficient did encourage patient adequate water intake along with sending in some electrolyte drinks.  Encourage patient to get urine light yellow color to clear color.  Limit caffeine intake as this is a diuretic.

## 2022-08-20 NOTE — Patient Instructions (Signed)
Nice to see you today I will be in touch with the labs once I have them Try to incorporate 1-2 electrolyte drinks through the day and get your urine a pale yellow to almost clear color Follow up if no improvement

## 2022-08-20 NOTE — Progress Notes (Signed)
Established Patient Office Visit  Subjective   Patient ID: Kerry Martinez, female    DOB: August 15, 1986  Age: 36 y.o. MRN: [id number]  Chief Complaint  Patient presents with   Dizziness    X 3 days, when she moves her head to the sides "she feels like the world is moving slower than herself."      Dizziness: started 3 days ago and started later in the day. States the most recent change in medication was prazosin and that has been a month ago.  States the increase by 1 mg.  She does have a follow-up with her behavioral health provider within a month. No sick contacts No URI symptoms No recent sickness personally. Has not tried anything over the counter.  States that she has noticed that her heart feels storng/forceful and fast.  Denies palpitations  She states that position changes makes the sensation of the swimmy headed/lightheadedness worse and turning of the head also makes the symptoms worse.    Review of Systems  Constitutional:  Negative for chills and fever.  Respiratory:  Negative for shortness of breath.   Cardiovascular:  Negative for chest pain.  Neurological:  Positive for dizziness and tingling. Negative for weakness.      Objective:     BP 138/86   Pulse (!) 112   Temp (!) 96.9 F (36.1 C)   Resp 12   Wt (!) 384 lb 2 oz (174.2 kg)   SpO2 97%   BMI 62.95 kg/m    Physical Exam Vitals and nursing note reviewed.  Constitutional:      Appearance: Normal appearance. She is obese.  HENT:     Right Ear: Ear canal and external ear normal.     Left Ear: Ear canal and external ear normal.     Mouth/Throat:     Mouth: Mucous membranes are moist.     Pharynx: Oropharynx is clear.  Eyes:     Extraocular Movements: Extraocular movements intact.     Pupils: Pupils are equal, round, and reactive to light.  Cardiovascular:     Rate and Rhythm: Regular rhythm. Tachycardia present.     Heart sounds: Normal heart sounds.  Pulmonary:     Effort: Pulmonary  effort is normal.     Breath sounds: Normal breath sounds.  Neurological:     General: No focal deficit present.     Mental Status: She is alert.     Cranial Nerves: No facial asymmetry.     Motor: No weakness.     Coordination: Finger-Nose-Finger Test normal.     Deep Tendon Reflexes:     Reflex Scores:      Bicep reflexes are 2+ on the right side and 2+ on the left side.      Patellar reflexes are 2+ on the right side and 2+ on the left side.    Comments: Bilateral upper and lower extremity strength 5/5    Orthostatic VS for the past 72 hrs (Last 3 readings):  Orthostatic BP Patient Position Orthostatic Pulse  08/20/22 0938 130/78 Standing 125  08/20/22 0937 124/90 Sitting 120  08/20/22 0936 (!) 136/94 Supine 105     No results found for any visits on 08/20/22.    The ASCVD Risk score (Arnett DK, et al., 2019) failed to calculate for the following reasons:   The 2019 ASCVD risk score is only valid for ages 54 to 11    Assessment & Plan:   Problem List Items Addressed  This Visit       Cardiovascular and Mediastinum   Orthostatic hypotension    Encourage patient adequate hydration.  Also encourage getting 1-2 electrolyte derangements daily.  She can manage hydration status by checking color of urine.  Encouraged light yellow to clear color urine, limit caffeine intake.  We will check basic labs inclusive of thyroid to make sure she is metabolically sound and hematologically stable.  Follow-up if no improvement      Relevant Orders   CBC   Basic metabolic panel   TSH     Other   Tachycardia - Primary    Patient was tachycardic in office worse with orthostatic vitals.  Likely fluid deficient did encourage patient adequate water intake along with sending in some electrolyte drinks.  Encourage patient to get urine light yellow color to clear color.  Limit caffeine intake as this is a diuretic.      Relevant Orders   CBC   Basic metabolic panel   TSH    Return if  symptoms worsen or fail to improve, for As scheduled, sooner if needed.Audria Nine, NP

## 2022-08-20 NOTE — Assessment & Plan Note (Signed)
Encourage patient adequate hydration.  Also encourage getting 1-2 electrolyte derangements daily.  She can manage hydration status by checking color of urine.  Encouraged light yellow to clear color urine, limit caffeine intake.  We will check basic labs inclusive of thyroid to make sure she is metabolically sound and hematologically stable.  Follow-up if no improvement

## 2022-08-22 LAB — GI PROFILE, STOOL, PCR

## 2022-08-23 ENCOUNTER — Telehealth: Payer: Self-pay

## 2022-08-23 MED ORDER — DIFICID 200 MG PO TABS: 200.0000 mg | ORAL_TABLET | Freq: Two times a day (BID) | ORAL | 0 refills | Status: AC

## 2022-08-23 NOTE — Telephone Encounter (Signed)
Sent medication to the [facility] [place]. Called and left a message for call back

## 2022-08-23 NOTE — Telephone Encounter (Signed)
Patient called back and verbalized understanding she states she will go pick up the medication

## 2022-08-23 NOTE — Telephone Encounter (Signed)
-----   Message from Toney Reil, MD sent at 08/23/2022 12:30 PM EDT ----- Recommend Dificid 200 mg p.o. twice daily for 10 days  RV

## 2022-08-23 NOTE — Telephone Encounter (Signed)
Patient Stool test came back positive for C diff please advised

## 2022-08-25 ENCOUNTER — Ambulatory Visit (INDEPENDENT_AMBULATORY_CARE_PROVIDER_SITE_OTHER): Payer: BC Managed Care – PPO | Admitting: Nurse Practitioner

## 2022-08-25 ENCOUNTER — Encounter: Payer: Self-pay | Admitting: Nurse Practitioner

## 2022-08-25 VITALS — BP 136/80 | HR 108 | Temp 96.3°F | Resp 16 | Ht 66.0 in | Wt 384.1 lb

## 2022-08-25 DIAGNOSIS — F3340 Major depressive disorder, recurrent, in remission, unspecified: Secondary | ICD-10-CM

## 2022-08-25 DIAGNOSIS — I951 Orthostatic hypotension: Secondary | ICD-10-CM

## 2022-08-25 DIAGNOSIS — L4050 Arthropathic psoriasis, unspecified: Secondary | ICD-10-CM

## 2022-08-25 DIAGNOSIS — R42 Dizziness and giddiness: Secondary | ICD-10-CM

## 2022-08-25 DIAGNOSIS — R7303 Prediabetes: Secondary | ICD-10-CM

## 2022-08-25 DIAGNOSIS — R000 Tachycardia, unspecified: Secondary | ICD-10-CM

## 2022-08-25 DIAGNOSIS — G629 Polyneuropathy, unspecified: Secondary | ICD-10-CM

## 2022-08-25 DIAGNOSIS — K50019 Crohn's disease of small intestine with unspecified complications: Secondary | ICD-10-CM

## 2022-08-25 DIAGNOSIS — J4520 Mild intermittent asthma, uncomplicated: Secondary | ICD-10-CM | POA: Diagnosis not present

## 2022-08-25 DIAGNOSIS — M797 Fibromyalgia: Secondary | ICD-10-CM

## 2022-08-25 DIAGNOSIS — E538 Deficiency of other specified B group vitamins: Secondary | ICD-10-CM

## 2022-08-25 DIAGNOSIS — E559 Vitamin D deficiency, unspecified: Secondary | ICD-10-CM

## 2022-08-25 DIAGNOSIS — E039 Hypothyroidism, unspecified: Secondary | ICD-10-CM

## 2022-08-25 DIAGNOSIS — Z0000 Encounter for general adult medical examination without abnormal findings: Secondary | ICD-10-CM

## 2022-08-25 MED ORDER — MECLIZINE HCL 12.5 MG PO TABS: 12.5000 mg | ORAL_TABLET | Freq: Two times a day (BID) | ORAL | 0 refills | Status: AC | PRN

## 2022-08-25 NOTE — Assessment & Plan Note (Signed)
Patient has been participating in [employer] classes approximately 3 times a week at an hour each time.  Continue working on lifestyle modifications

## 2022-08-25 NOTE — Assessment & Plan Note (Signed)
Discussed age-appropriate immunization screening exams.  Did offer patient flu shot today in office.  Patient states too early in the season but she will get it a little later.  Deferred today

## 2022-08-25 NOTE — Patient Instructions (Addendum)
Nice to see you today I will be in touch with the labs once I have the results Follow up with me in 6 months, sooner if you need me We will try a small dose of meclizine to see if this helps with the dizziness. If not we may need to consider cardiology

## 2022-08-25 NOTE — Assessment & Plan Note (Signed)
Slightly improved in frequency but still happening.  Worse with positional changes sometimes with head movement.  Patient still slightly tachycardic she has been using over-the-counter electrolyte drinks 2 a day and her urine color has lightened to it has been in comparison.  We will try low-dose meclizine as patient is already on several antihistamines.  If this does not improve consider cardiology for possible POTS diagnosis

## 2022-08-25 NOTE — Assessment & Plan Note (Signed)
Patient currently followed by rheumatology.  Continue following as recommended taking medication as prescribed.

## 2022-08-25 NOTE — Assessment & Plan Note (Signed)
Still slightly tachycardic after increasing electrolyte drinks.  Patient still orthostatic and having that lightheadedness/dizziness.  We will try low-dose meclizine to see if this is beneficial.  If not consider referral to cardiology for POTS work-up.

## 2022-08-25 NOTE — Assessment & Plan Note (Signed)
Patient currently followed by Dr. Rachel Callas allergy and asthma specialist

## 2022-08-25 NOTE — Assessment & Plan Note (Signed)
Last A1c 5.7 pending lab redraw

## 2022-08-25 NOTE — Progress Notes (Signed)
Established Patient Office Visit  Subjective   Patient ID: ALDONIA KEEVEN, female    DOB: 1986/08/07  Age: 36 y.o. MRN: [id number]  Chief Complaint  Patient presents with   Annual Exam    Sees GYN [place]     HPI  Hypothyroidism: Patient is currently managed on levothyroxine 112 mcg.  Recent TSH check was within normal limits.  Small fiber neuropathy: This was diagnosed and followed by neurology currently. Not currenlty followed by neuro  Vitamin B12 deficiency: Secondary to gastric resection  Vitamin D deficiency: Secondary to gastric resection patient maintained on vitamin D 50,000 units once a week.  Prior was 10,000 units daily  Prediabetes: Patient is status post gastric resection.  Patient's last A1c was 5.7  Depression: Patient is followed by psychiatry and therapy. She is currently maintained on aripiprazole 5 mg, doxepin (this is used more as antihistamine versus behavioral medication), venlafaxine, prazosin.  Chrons Disease: Patient is currently followed by gastroenterology.  Currently maintained on Remicade injections. [facility] currenlty montioring   Patient is followed by psychiatry, rheumatology, gastroenterology, allergy and asthma specialist.  for complete physical and follow up of chronic conditions.  Immunizations: -Tetanus: Tdap 2022 -Influenza: refused -Covid-19: Moderna x2 with several boosters -Shingles: Too young -Pneumonia: Patient has had Prevnar 13 and pneumococcal 23.  -HPV: utd  Diet: Fair diet. Trying to eat 3 meals a day will get 2-3 meals a day. Some snacks. Dr. Reino Kent or chia tea, water and then end with ginger ale Exercise: No regular exercise. Jazzercise 3 times a week at an hour each   Eye exam: Completed in 2022. Wears glasses  Dental exam: Completes semi-annually   Pap Smear: Completed in [place]. Pap was 2022 Mammogram: Completed in   Colonoscopy: Completed in 2022 Lung Cancer Screening: Completed in  Dexa: Completed  in  PSA: Due  Sleep: tries to go to sleep around 10. Will get up around530-730. SOmetime feels rested. Does not snore  Dizziness: she was seen on 08/ /2023. We did basic labs and they were ok. She was orthostatic in office. States that it has improved in frequency but still present with position changes. 2 a day. State that her urine is a light yellow.   Review of Systems  Constitutional:  Negative for chills, fever and malaise/fatigue.  Respiratory:  Negative for shortness of breath.   Cardiovascular:  Negative for chest pain, palpitations and leg swelling.  Gastrointestinal:  Positive for nausea. Negative for abdominal pain, blood in stool and vomiting.       BM normally 3 a day. Currently has C diff  Genitourinary:  Negative for dysuria and frequency.  Neurological:  Positive for headaches.  Psychiatric/Behavioral:  Negative for hallucinations and suicidal ideas.       Objective:     BP 136/80   Pulse (!) 108   Temp (!) 96.3 F (35.7 C)   Resp 16   Ht 5\' 6"  (1.676 m)   Wt (!) 384 lb 2 oz (174.2 kg)   SpO2 98%   BMI 62.00 kg/m  BP Readings from Last 3 Encounters:  08/25/22 136/80  08/20/22 138/86  06/03/22 (!) 136/95   Wt Readings from Last 3 Encounters:  08/25/22 (!) 384 lb 2 oz (174.2 kg)  08/20/22 (!) 384 lb 2 oz (174.2 kg)  06/03/22 (!) 373 lb 6 oz (169.4 kg)      Physical Exam Vitals and nursing note reviewed.  Constitutional:      Appearance: Normal appearance.  HENT:     Right Ear: Tympanic membrane, ear canal and external ear normal.     Left Ear: Ear canal and external ear normal.     Ears:     Comments: Shadowing to let upper anterior TM. Has been evaluated by ENT    Mouth/Throat:     Mouth: Mucous membranes are moist.     Pharynx: Oropharynx is clear.  Eyes:     Extraocular Movements: Extraocular movements intact.     Pupils: Pupils are equal, round, and reactive to light.     Comments: Wears corrective lenses  Cardiovascular:     Rate and  Rhythm: Regular rhythm. Tachycardia present.     Pulses: Normal pulses.     Heart sounds: Normal heart sounds.  Pulmonary:     Effort: Pulmonary effort is normal.     Breath sounds: Normal breath sounds.  Abdominal:     General: Bowel sounds are normal. There is no distension.     Palpations: There is no mass.     Tenderness: There is no abdominal tenderness.     Hernia: No hernia is present.  Musculoskeletal:     Right lower leg: No edema.     Left lower leg: No edema.  Lymphadenopathy:     Cervical: No cervical adenopathy.  Skin:    General: Skin is warm.  Neurological:     General: No focal deficit present.     Mental Status: She is alert.     Comments: Bilateral upper and lower extremity strength 5/5  Psychiatric:        Mood and Affect: Mood normal.        Behavior: Behavior normal.        Thought Content: Thought content normal.        Judgment: Judgment normal.      No results found for any visits on 08/25/22.    The ASCVD Risk score (Arnett DK, et al., 2019) failed to calculate for the following reasons:   The 2019 ASCVD risk score is only valid for ages 32 to 32    Assessment & Plan:   Problem List Items Addressed This Visit       Cardiovascular and Mediastinum   Orthostatic hypotension    Slightly improved in frequency but still happening.  Worse with positional changes sometimes with head movement.  Patient still slightly tachycardic she has been using over-the-counter electrolyte drinks 2 a day and her urine color has lightened to it has been in comparison.  We will try low-dose meclizine as patient is already on several antihistamines.  If this does not improve consider cardiology for possible POTS diagnosis        Respiratory   Asthma    Patient currently followed by Dr. [facility] Callas allergy and asthma specialist        Endocrine   Acquired hypothyroidism    Currently maintained on levothyroxine 112 mcg.  Last TSH within normal limits continue taking  medication as prescribed        Nervous and Auditory   Small fiber neuropathy    Diagnosed when patient was living up [place].  Not currently followed by neurology locally.  Patient would like to be followed by neurology.  Ambulatory referral placed for [facility] neurology.      Relevant Orders   Ambulatory referral to Neurology     Musculoskeletal and Integument   Psoriatic arthritis [facility])    Patient currently followed by rheumatology.  Continue following as recommended taking  medication as prescribed.        Other   Crohn's disease (HCC)    Has had partial hemicolectomy.  Currently on Remicade and followed by gastroenterology through [place].  Continue follow-up as recommended Key take medication as prescribed      Depression   Vitamin B12 deficiency    Currently gives herself B12 injections once monthly.  Pending B12 level today      Relevant Orders   Vitamin B12   Vitamin D deficiency    Currently maintained on vitamin D 50,000 units once weekly.  Patient used to be on vitamin D 10,000 units daily.  Pending labs today.      Relevant Orders   VITAMIN D 25 Hydroxy (Vit-D Deficiency, Fractures)   Morbid obesity (HCC)    Patient has been participating in [place] classes approximately 3 times a week at an hour each time.  Continue working on lifestyle modifications      Relevant Orders   Lipid panel   Fibromyalgia   Preventative health care - Primary    Discussed age-appropriate immunization screening exams.  Did offer patient flu shot today in office.  Patient states too early in the season but she will get it a little later.  Deferred today      Relevant Orders   Lipid panel   Prediabetes    Last A1c 5.7 pending lab redraw      Relevant Orders   Lipid panel   Tachycardia    Still slightly tachycardic after increasing electrolyte drinks.  Patient still orthostatic and having that lightheadedness/dizziness.  We will try low-dose meclizine to see if this is  beneficial.  If not consider referral to cardiology for POTS work-up.      Other Visit Diagnoses     Lightheadedness       Relevant Medications   meclizine (ANTIVERT) 12.5 MG tablet       Return in about 6 months (around 02/24/2023) for Recheck of chronic conditions.Audria Nine, NP

## 2022-08-25 NOTE — Assessment & Plan Note (Signed)
Currently gives herself B12 injections once monthly.  Pending B12 level today

## 2022-08-25 NOTE — Assessment & Plan Note (Signed)
Currently maintained on vitamin D 50,000 units once weekly.  Patient used to be on vitamin D 10,000 units daily.  Pending labs today.

## 2022-08-25 NOTE — Assessment & Plan Note (Signed)
Currently maintained on levothyroxine 112 mcg.  Last TSH within normal limits continue taking medication as prescribed

## 2022-08-25 NOTE — Assessment & Plan Note (Signed)
Diagnosed when patient was living up [place].  Not currently followed by neurology locally.  Patient would like to be followed by neurology.  Ambulatory referral placed for [facility] neurology.

## 2022-08-25 NOTE — Assessment & Plan Note (Signed)
Has had partial hemicolectomy.  Currently on Remicade and followed by gastroenterology through [facility].  Continue follow-up as recommended Key take medication as prescribed

## 2022-08-26 DIAGNOSIS — K50919 Crohn's disease, unspecified, with unspecified complications: Secondary | ICD-10-CM | POA: Diagnosis not present

## 2022-08-31 ENCOUNTER — Encounter: Payer: Self-pay | Admitting: Gastroenterology

## 2022-08-31 DIAGNOSIS — F4312 Post-traumatic stress disorder, chronic: Secondary | ICD-10-CM | POA: Diagnosis not present

## 2022-08-31 DIAGNOSIS — F329 Major depressive disorder, single episode, unspecified: Secondary | ICD-10-CM | POA: Diagnosis not present

## 2022-08-31 DIAGNOSIS — F4323 Adjustment disorder with mixed anxiety and depressed mood: Secondary | ICD-10-CM | POA: Diagnosis not present

## 2022-09-01 ENCOUNTER — Other Ambulatory Visit (INDEPENDENT_AMBULATORY_CARE_PROVIDER_SITE_OTHER): Payer: BC Managed Care – PPO

## 2022-09-01 DIAGNOSIS — R7303 Prediabetes: Secondary | ICD-10-CM | POA: Diagnosis not present

## 2022-09-01 DIAGNOSIS — F4323 Adjustment disorder with mixed anxiety and depressed mood: Secondary | ICD-10-CM | POA: Diagnosis not present

## 2022-09-01 DIAGNOSIS — E559 Vitamin D deficiency, unspecified: Secondary | ICD-10-CM

## 2022-09-01 DIAGNOSIS — Z0000 Encounter for general adult medical examination without abnormal findings: Secondary | ICD-10-CM

## 2022-09-01 DIAGNOSIS — F329 Major depressive disorder, single episode, unspecified: Secondary | ICD-10-CM | POA: Diagnosis not present

## 2022-09-01 DIAGNOSIS — F4312 Post-traumatic stress disorder, chronic: Secondary | ICD-10-CM | POA: Diagnosis not present

## 2022-09-01 DIAGNOSIS — E538 Deficiency of other specified B group vitamins: Secondary | ICD-10-CM

## 2022-09-01 LAB — LIPID PANEL
Cholesterol: 220 mg/dL — ABNORMAL HIGH (ref 0–200)
HDL: 58.1 mg/dL (ref >39.00)
LDL Cholesterol: 132 mg/dL — ABNORMAL HIGH (ref 0–99)
NonHDL: 162.05
Total CHOL/HDL Ratio: 4
Triglycerides: 148 mg/dL (ref 0.0–149.0)
VLDL: 29.6 mg/dL (ref 0.0–40.0)

## 2022-09-01 LAB — VITAMIN B12: Vitamin B-12: 299 pg/mL (ref 211–911)

## 2022-09-01 LAB — VITAMIN D 25 HYDROXY (VIT D DEFICIENCY, FRACTURES): VITD: 37.45 ng/mL (ref 30.00–100.00)

## 2022-09-14 DIAGNOSIS — J3089 Other allergic rhinitis: Secondary | ICD-10-CM | POA: Diagnosis not present

## 2022-09-14 DIAGNOSIS — L501 Idiopathic urticaria: Secondary | ICD-10-CM | POA: Diagnosis not present

## 2022-09-14 DIAGNOSIS — H1045 Other chronic allergic conjunctivitis: Secondary | ICD-10-CM | POA: Diagnosis not present

## 2022-09-14 DIAGNOSIS — J4530 Mild persistent asthma, uncomplicated: Secondary | ICD-10-CM | POA: Diagnosis not present

## 2022-09-17 DIAGNOSIS — F329 Major depressive disorder, single episode, unspecified: Secondary | ICD-10-CM | POA: Diagnosis not present

## 2022-09-17 DIAGNOSIS — F4323 Adjustment disorder with mixed anxiety and depressed mood: Secondary | ICD-10-CM | POA: Diagnosis not present

## 2022-09-17 DIAGNOSIS — F4312 Post-traumatic stress disorder, chronic: Secondary | ICD-10-CM | POA: Diagnosis not present

## 2022-09-23 ENCOUNTER — Other Ambulatory Visit: Payer: Self-pay | Admitting: Nurse Practitioner

## 2022-09-23 DIAGNOSIS — J4520 Mild intermittent asthma, uncomplicated: Secondary | ICD-10-CM

## 2022-09-29 ENCOUNTER — Ambulatory Visit: Payer: BC Managed Care – PPO | Admitting: Gastroenterology

## 2022-09-30 DIAGNOSIS — F329 Major depressive disorder, single episode, unspecified: Secondary | ICD-10-CM | POA: Diagnosis not present

## 2022-09-30 DIAGNOSIS — F4312 Post-traumatic stress disorder, chronic: Secondary | ICD-10-CM | POA: Diagnosis not present

## 2022-10-07 ENCOUNTER — Other Ambulatory Visit: Payer: Self-pay

## 2022-10-13 ENCOUNTER — Ambulatory Visit: Payer: BC Managed Care – PPO | Admitting: Gastroenterology

## 2022-10-14 DIAGNOSIS — F4312 Post-traumatic stress disorder, chronic: Secondary | ICD-10-CM | POA: Diagnosis not present

## 2022-10-14 DIAGNOSIS — F329 Major depressive disorder, single episode, unspecified: Secondary | ICD-10-CM | POA: Diagnosis not present

## 2022-10-25 ENCOUNTER — Encounter (INDEPENDENT_AMBULATORY_CARE_PROVIDER_SITE_OTHER): Payer: Self-pay

## 2022-10-27 DIAGNOSIS — K50919 Crohn's disease, unspecified, with unspecified complications: Secondary | ICD-10-CM | POA: Diagnosis not present

## 2022-10-28 DIAGNOSIS — F4312 Post-traumatic stress disorder, chronic: Secondary | ICD-10-CM | POA: Diagnosis not present

## 2022-10-28 DIAGNOSIS — F329 Major depressive disorder, single episode, unspecified: Secondary | ICD-10-CM | POA: Diagnosis not present

## 2022-10-28 DIAGNOSIS — K50919 Crohn's disease, unspecified, with unspecified complications: Secondary | ICD-10-CM | POA: Diagnosis not present

## 2022-10-29 DIAGNOSIS — F4312 Post-traumatic stress disorder, chronic: Secondary | ICD-10-CM | POA: Diagnosis not present

## 2022-10-29 DIAGNOSIS — F329 Major depressive disorder, single episode, unspecified: Secondary | ICD-10-CM | POA: Diagnosis not present

## 2022-11-04 ENCOUNTER — Encounter: Payer: Self-pay | Admitting: Gastroenterology

## 2022-11-04 ENCOUNTER — Encounter: Payer: Self-pay | Admitting: Nurse Practitioner

## 2022-11-04 ENCOUNTER — Other Ambulatory Visit: Payer: Self-pay | Admitting: Nurse Practitioner

## 2022-11-04 MED ORDER — GABAPENTIN 600 MG PO TABS: 600.0000 mg | ORAL_TABLET | Freq: Every day | ORAL | 0 refills | Status: AC

## 2022-11-10 ENCOUNTER — Telehealth: Payer: BC Managed Care – PPO | Admitting: Physician Assistant

## 2022-11-10 DIAGNOSIS — J069 Acute upper respiratory infection, unspecified: Secondary | ICD-10-CM

## 2022-11-10 MED ORDER — BENZONATATE 100 MG PO CAPS: 100.0000 mg | ORAL_CAPSULE | Freq: Three times a day (TID) | ORAL | 0 refills | Status: DC | PRN

## 2022-11-10 MED ORDER — PROMETHAZINE-DM 6.25-15 MG/5ML PO SYRP: 5.0000 mL | ORAL_SOLUTION | Freq: Four times a day (QID) | ORAL | 0 refills | Status: DC | PRN

## 2022-11-10 MED ORDER — AZELASTINE HCL 0.1 % NA SOLN: 1.0000 | Freq: Two times a day (BID) | NASAL | 0 refills | Status: DC

## 2022-11-10 NOTE — Progress Notes (Signed)
E-Visit for Corona Virus Screening  Your current symptoms could be consistent with the coronavirus.  Many health care providers can now test patients at their office but not all are.  [facility] has multiple testing sites. For information on our COVID testing locations and hours go to [email or web address]  We are enrolling you in our MyChart Home Monitoring for COVID19 . Daily you will receive a questionnaire within the MyChart website. Our COVID 19 response team will be monitoring your responses daily.  Testing Information: The COVID-19 Community Testing sites are testing BY APPOINTMENT ONLY.  You can schedule online at [email or web address]  If you do not have access to a smart phone or computer you may call [phone] for an appointment.   Additional testing sites in the Community:  For [facility] Testing sites in [place]  [email or web address]  For Pop-up testing sites in [place]  [email or web address]  For [email or web address]  For [facility] testing in [place] and [email or web address]  For [facility] testing in [place]   [email or web address]  For  more information about community testing call [phone]   Please quarantine yourself while awaiting your test results. Please stay home for a minimum of 10 days from the first day of illness with improving symptoms and you have had 24 hours of no fever (without the use of Tylenol (Acetaminophen) Motrin (Ibuprofen) or any fever reducing medication).  Also - Do not get tested prior to returning to work because once you have had a positive test the test can stay positive for  more than a month in some cases.   You should wear a mask or cloth face covering over your nose and mouth if you must be around other people or animals, including pets (even at home). Try to stay at least 6 feet away from other people. This will protect the people around you.  Please continue good preventive care measures, including:  frequent hand-washing, avoid touching your face, cover coughs/sneezes, stay out of crowds and keep a 6 foot distance from others.  COVID-19 is a respiratory illness with symptoms that are similar to the flu. Symptoms are typically mild to moderate, but there have been cases of severe illness and death due to the virus.   The following symptoms may appear 2-14 days after exposure: Fever Cough Shortness of breath or difficulty breathing Chills Repeated shaking with chills Muscle pain Headache Sore throat New loss of taste or smell Fatigue Congestion or runny nose Nausea or vomiting Diarrhea  Go to the nearest hospital ED for assessment if fever/cough/breathlessness are severe or illness seems like a threat to life.  It is vitally important that if you feel that you have an infection such as this virus or any other virus that you stay home and away from places where you may spread it to others.  You should avoid contact with people age 38 and older.   You can use medication such as prescription cough medication called Tessalon Perles 100 mg. You may take 1-2 capsules every 8 hours as needed for cough, prescription cough medication called Phenergan DM 6.25 mg/15 mg. You make take one teaspoon / 5 ml every 4-6 hours as needed for cough, and prescription for Azelastine nasal spray 2 sprays in each nostril twice per day  You may also take acetaminophen (Tylenol) as needed for fever.  Reduce your risk of any infection by using the same precautions used for avoiding the common cold or flu:  Wash your hands  often with soap and warm water for at least 20 seconds.  If  soap and water are not readily available, use an alcohol-based hand sanitizer with at least 60% alcohol.  If coughing or sneezing, cover your mouth and nose by coughing or sneezing into the elbow areas of your shirt or coat, into a tissue or into your sleeve (not your hands). Avoid shaking hands with others and consider head nods or verbal greetings only. Avoid touching your eyes, nose, or mouth with unwashed hands.  Avoid close contact with people who are sick. Avoid places or events with large numbers of people in one location, like concerts or sporting events. Carefully consider travel plans you have or are making. If you are planning any travel outside or inside the [place], visit the CDC's Travelers' Health webpage for the latest health notices. If you have some symptoms but not all symptoms, continue to monitor at home and seek medical attention if your symptoms worsen. If you are having a medical emergency, call 911.  HOME CARE Only take medications as instructed by your medical team. Drink plenty of fluids and get plenty of rest. A steam or ultrasonic humidifier can help if you have congestion.   GET HELP RIGHT AWAY IF YOU HAVE EMERGENCY WARNING SIGNS** FOR COVID-19. If you or someone is showing any of these signs seek emergency medical care immediately. Call 911 or proceed to your closest emergency facility if: You develop worsening high fever. Trouble breathing Bluish lips or face Persistent pain or pressure in the chest New confusion Inability to wake or stay awake You cough up blood. Your symptoms become more severe  **This list is not all possible symptoms. Contact your medical provider for any symptoms that are sever or concerning to you.  MAKE SURE YOU  Understand these instructions. Will watch your condition. Will get help right away if you are not doing well or get worse.  Your e-visit answers were reviewed by a board certified advanced clinical practitioner to complete  your personal care plan.  Depending on the condition, your plan could have included both over the counter or prescription medications.  If there is a problem please reply once you have received a response from your provider.  Your safety is important to [place].  If you have drug allergies check your prescription carefully.    You can use MyChart to ask questions about today's visit, request a non-urgent call back, or ask for a work or school excuse for 24 hours related to this e-Visit. If it has been greater than 24 hours you will need to follow up with your provider, or enter a new e-Visit to address those concerns. You will get an e-mail in the next two days asking about your experience.  I hope that your e-visit has been valuable and will speed your recovery. Thank you for using e-visits.  I have spent 5 minutes in review of e-visit questionnaire, review and updating patient chart, medical decision making and response to patient.   Margaretann Loveless, PA-C

## 2022-11-15 DIAGNOSIS — R200 Anesthesia of skin: Secondary | ICD-10-CM | POA: Diagnosis not present

## 2022-11-15 DIAGNOSIS — F329 Major depressive disorder, single episode, unspecified: Secondary | ICD-10-CM | POA: Diagnosis not present

## 2022-11-15 DIAGNOSIS — F4312 Post-traumatic stress disorder, chronic: Secondary | ICD-10-CM | POA: Diagnosis not present

## 2022-11-15 DIAGNOSIS — R202 Paresthesia of skin: Secondary | ICD-10-CM | POA: Diagnosis not present

## 2022-11-15 DIAGNOSIS — G629 Polyneuropathy, unspecified: Secondary | ICD-10-CM | POA: Diagnosis not present

## 2022-11-17 ENCOUNTER — Other Ambulatory Visit: Payer: Self-pay

## 2022-11-23 ENCOUNTER — Other Ambulatory Visit: Payer: Self-pay | Admitting: Gastroenterology

## 2022-11-23 ENCOUNTER — Ambulatory Visit (INDEPENDENT_AMBULATORY_CARE_PROVIDER_SITE_OTHER): Payer: BC Managed Care – PPO | Admitting: Gastroenterology

## 2022-11-23 ENCOUNTER — Encounter: Payer: Self-pay | Admitting: Gastroenterology

## 2022-11-23 VITALS — BP 145/89 | HR 109 | Temp 97.9°F | Ht 66.0 in | Wt 391.0 lb

## 2022-11-23 DIAGNOSIS — K50019 Crohn's disease of small intestine with unspecified complications: Secondary | ICD-10-CM

## 2022-11-23 DIAGNOSIS — Z23 Encounter for immunization: Secondary | ICD-10-CM | POA: Diagnosis not present

## 2022-11-23 MED ORDER — ZOSTER VAC RECOMB ADJUVANTED 50 MCG/0.5ML IM SUSR: 0.5000 mL | Freq: Once | INTRAMUSCULAR | 0 refills | Status: AC

## 2022-11-23 MED ORDER — SUTAB 1479-225-188 MG PO TABS: ORAL_TABLET | ORAL | 0 refills | Status: DC

## 2022-11-23 NOTE — Progress Notes (Signed)
Kerry Repress, MD [address]  [address]  [place], [place] [address]  Main: [phone]  Fax: [phone]    Gastroenterology Consultation  Referring Provider:     Eden Emms, NP Primary Care Physician:  Eden Emms, NP Primary Gastroenterologist:  Dr. Arlyss Martinez Reason for Consultation: Small bowel Crohn's        HPI:   Kerry Martinez is a 36 y.o. female referred by Eden Emms, NP  for consultation & management of small bowel Crohn's.  Patient is diagnosed with ileal Crohn's in 01/2006.  She was on several rounds of prednisone during initial diagnosis, followed by Humira for about 3 years.  She underwent ileocecal resection with primary anastomosis due to stricture formation.  Humira was restarted postsurgery, however she was found to have antibodies against Humira and therefore it was discontinued.  Switched to Remicade approximately in 2015.  Patient has been on Remicade 5 mg/kg body weight every 8 weeks and reportedly patient has been doing well, in clinical remission.  She also reports that colonoscopy from 2022 performed at [facility] was normal.  Patient has a port in order to receive Remicade infusions.  Patient's primary GI doctor left [facility] and therefore patient switched her care to [facility].  Patient generally has 1-2 semiformed to formed bowel movements daily.  However, within last 2 to 3 weeks, she has been experiencing more loose stools and 3-4 times daily without any rectal bleeding.  She is also past due for Remicade infusion by 1 month.  Labs from 02/23/2022 including CBC, CMP were unremarkable.  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  Lives with her grandmother who is recently diagnosed with C. difficile and underwent FMT by [facility]  Follow-up visit 11/23/2022 Patient has reinitiated Remicade infusion on 8/31.  She had C. difficile infection 06/09/2022, treated with 10 days course of oral vancomycin.   Her diarrhea recurred in late August 2023, repeat stool studies came back positive for C. difficile, this episode was treated with 10 days course of fidaxomicin.  She has been doing well since treatment of C. Difficile infection.  She has problems accessing the port during last infusion.  NSAIDs: None  Antiplts/Anticoagulants/Anti thrombotics: None  GI Procedures: Received colonoscopy in 01/2012 by [facility] gastroenterology Found to have moderate inflammation in the terminal ileum.  Pathology report not available with me today  Past Medical History:  Diagnosis Date   Allergy    Anemia    Asthma    Crohn's disease (HCC)    Depression    Fibromyalgia    Migraine    Polycystic ovarian syndrome 12/27/2010   s/p endocrinology consult/[facility].   Psoriatic arthritis [facility])     Past Surgical History:  Procedure Laterality Date   Admission  11/10/12   coffee ground emesis, abdominal pain.  EGD with 3 gastric ulcers and HH.  s/p lab band reversal.  [facility].  GI consults, Bariatric Surgery Consult.   COLONOSCOPY W/ BIOPSIES     ear graph  2004   ESOPHAGOGASTRODUODENOSCOPY  11/12/12   3 gastric ulcers, hiatal hernia.  [facility].   Lap band procedure  04/06/2011   [facility] Re[facility]  11/12/12    [facility]   LAPAROSCOPIC GASTRIC SLEEVE RESECTION  03/27/2013   MASTOIDECTOMY  1991   PARTIAL COLECTOMY  12/26/12   Crohn's disease resection; [facility]. [facility]   PORTA CATH INSERTION N/A 11/30/2021   Procedure: PORTA CATH  INSERTION;  Surgeon: Annice Needy, MD;  Location: [facility] INVASIVE CV LAB;  Service: Cardiovascular;  Laterality: N/A;   TONSILLECTOMY  1997   TYMPANOSTOMY TUBE PLACEMENT  1990    Current Outpatient Medications:    albuterol (VENTOLIN HFA) 108 (90 Base) MCG/ACT inhaler, Inhale 2 puffs into the lungs. every 4 to 6 hours as needed, Disp: , Rfl:    ARIPiprazole (ABILIFY) 10 MG tablet, Take 10 mg by mouth daily., Disp: , Rfl:    ASMANEX, 120 METERED DOSES, 220 MCG/ACT inhaler,  Inhale into the lungs., Disp: , Rfl:    azelastine (ASTELIN) 0.1 % nasal spray, Place 1 spray into both nostrils 2 (two) times daily. Use in each nostril as directed, Disp: 30 mL, Rfl: 0   budesonide (PULMICORT FLEXHALER) 180 MCG/ACT inhaler, Inhale into the lungs., Disp: , Rfl:    cetirizine (ZYRTEC ALLERGY) 10 MG tablet, 1 tablet, Disp: , Rfl:    cyanocobalamin (,VITAMIN B-12,) 1000 MCG/ML injection, Inject 1,000 mcg into the muscle every 30 (thirty) days., Disp: , Rfl:    doxepin (SINEQUAN) 10 MG capsule, 1 capsule at bedtime, Disp: , Rfl:    EPINEPHrine 0.3 mg/0.3 mL IJ SOAJ injection, See admin instructions., Disp: , Rfl:    famotidine (PEPCID) 40 MG tablet, Take 40 mg by mouth 2 (two) times daily., Disp: , Rfl:    fluticasone (FLONASE) 50 MCG/ACT nasal spray, SPRAY 1 TO 2 SPRAYS INTO EACH NOSTRIL ONCE A DAY, Disp: , Rfl:    folic acid (FOLVITE) 1 MG tablet, Take by mouth., Disp: , Rfl:    gabapentin (NEURONTIN) 600 MG tablet, Take 1 tablet (600 mg total) by mouth daily., Disp: 7 tablet, Rfl: 0   hydrocortisone 2.5 % ointment, As needed for psoriasis, Disp: , Rfl:    inFLIXimab (REMICADE) 100 MG injection, Inject into the vein., Disp: , Rfl:    ketotifen (ZADITOR) 0.025 % ophthalmic solution, 1 drop into affected eye, Disp: , Rfl:    levocetirizine (XYZAL ALLERGY 24HR) 5 MG tablet, Xyzal 5 mg tablet  Take 1 tablet every day by oral route., Disp: , Rfl:    levonorgestrel (MIRENA) 20 MCG/DAY IUD, 1 each by Intrauterine route once., Disp: , Rfl:    levothyroxine (SYNTHROID) 112 MCG tablet, TAKE 1 TABLET BY MOUTH DAILY BEFORE BREAKFAST., Disp: 90 tablet, Rfl: 1   meclizine (ANTIVERT) 12.5 MG tablet, Take 1 tablet (12.5 mg total) by mouth 2 (two) times daily as needed for dizziness., Disp: 15 tablet, Rfl: 0   Methotrexate, PF, (RASUVO) 20 MG/0.4ML SOAJ, See admin instructions., Disp: , Rfl:    montelukast (SINGULAIR) 10 MG tablet, TAKE 1 TABLET BY MOUTH EVERYDAY AT BEDTIME, Disp: 90 tablet, Rfl:  3   Multiple Vitamins-Minerals (MULTIVITAMINS THER. W/MINERALS) TABS, Take 1 tablet by mouth daily., Disp: , Rfl:    Nutritional Supplements (VITAMIN D PLUS COFACTORS) TABS, See admin instructions., Disp: , Rfl:    omalizumab (XOLAIR) 150 MG/ML prefilled syringe, Inject into the skin., Disp: , Rfl:    ondansetron (ZOFRAN) 4 MG tablet, Take 4 mg by mouth 3 (three) times daily as needed., Disp: , Rfl:    prazosin (MINIPRESS) 2 MG capsule, Take 4 capsules by mouth daily., Disp: , Rfl:    Sodium Sulfate-Mag Sulfate-KCl (SUTAB) [phone] MG TABS, Per instructions given, Disp: 24 tablet, Rfl: 0   tacrolimus (PROTOPIC) 0.1 % ointment, Apply topically daily as needed., Disp: , Rfl:    venlafaxine (EFFEXOR) 100 MG tablet, Take 3 tablets once daily, Disp: ,  Rfl:    Vitamin D, Ergocalciferol, (DRISDOL) 1.25 MG (50000 UNIT) CAPS capsule, TAKE 1 CAPSULE (50,000 UNITS TOTAL) BY MOUTH EVERY 7 (SEVEN) DAYS, Disp: 12 capsule, Rfl: 1    Family History  Problem Relation Age of Onset   ALS Mother    Cancer Mother        precancerous polyps in colon   Asthma Mother    Depression Mother    Diabetes Mother    Hyperlipidemia Mother    Hypertension Mother    Heart disease Father    Diabetes Father    Hypertension Father    Hyperlipidemia Father    Other Sister        fibromyalgia   Fibromyalgia Sister    Hypertension Brother    Kidney disease Maternal Grandmother    Diabetes Maternal Grandmother    Heart disease Maternal Grandfather    Kidney disease Maternal Grandfather    Diabetes Maternal Grandfather    Kidney disease Paternal Grandmother    Diabetes Paternal Grandmother    Heart disease Paternal Grandfather    Diabetes Paternal Grandfather    Parkinson's disease Paternal Grandfather      Social History   Tobacco Use   Smoking status: Never   Smokeless tobacco: Never  Vaping Use   Vaping Use: Never used  Substance Use Topics   Alcohol use: Yes    Comment: socially   Drug use: No     Allergies as of 11/23/2022 - Review Complete 11/10/2022  Allergen Reaction Noted   Amoxicillin Hives 06/25/2011   Cefprozil Hives 06/25/2011   Iodinated contrast media Anaphylaxis 11/13/2020   Abacavir Itching 09/14/2019   Cephalosporins Hives and Itching 01/30/2012   Cat hair extract  08/25/2021   Multihance [gadobenate]  08/16/2013   Sulfa antibiotics Hives and Other (See Comments) 08/02/2018    Review of Systems:    All systems reviewed and negative except where noted in HPI.   Physical Exam:  BP (!) 145/89 (BP Location: Right Wrist, Patient Position: Sitting, Cuff Size: Normal)   Pulse (!) 109   Temp 97.9 F (36.6 C) (Oral)   Wt (!) 391 lb (177.4 kg)   BMI 63.11 kg/m  No LMP recorded. (Menstrual status: IUD).  General:   Alert,  Well-developed, well-nourished, pleasant and cooperative in NAD Head:  Normocephalic and atraumatic. Eyes:  Sclera clear, no icterus.   Conjunctiva pink. Ears:  Normal auditory acuity. Nose:  No deformity, discharge, or lesions. Mouth:  No deformity or lesions,oropharynx pink & moist. Neck:  Supple; no masses or thyromegaly. Lungs:  Respirations even and unlabored.  Clear throughout to auscultation.   No wheezes, crackles, or rhonchi. No acute distress. Heart:  Regular rate and rhythm; no murmurs, clicks, rubs, or gallops. Abdomen:  Normal bowel sounds. Soft, morbidly obese, non-tender and non-distended without masses, hepatosplenomegaly or hernias noted.  No guarding or rebound tenderness.   Rectal: Not performed Msk:  Symmetrical without gross deformities. Good, equal movement & strength bilaterally. Pulses:  Normal pulses noted. Extremities:  No clubbing or edema.  No cyanosis. Neurologic:  Alert and oriented x3;  grossly normal neurologically. Skin:  Intact without significant lesions or rashes. No jaundice. Psych:  Alert and cooperative. Normal mood and affect.  Imaging Studies: None recently  Assessment and Plan:   SUSETTE SEMINARA is a 36 y.o. pleasant Caucasian female with morbid obesity, psoriatic arthritis, PCOS, depression, asthma, ileal Crohn's diagnosed in 2007, s/p ileocolonic resection, previously on Humira, discontinued due to antibody formation,  switched to Remicade in 2015, has been maintained on Remicade 5 mg/kg body weight every 8 weeks.    Small bowel Crohn's, s/p ileocolic resection with exacerbation in June 2023 secondary to C. difficile infection.  Treated with 10 days course of oral vancomycin.  Had first recurrence in August 2023, treated with 10 days course of fidaxomicin. Continue Remicade 5 mg/kg body weight every 8 weeks Check CBC, CMP, CRP Recommend to check labs with every other infusion Referred to interventional radiology to evaluate the port access Discussed with patient regarding repeat colonoscopy to assess severity of inflammation and she is agreeable  IBD Health Maintenance  1.TB status: QuantiFERON gold 03/18/2022 negative 2. Anemia: None 3.Immunizations: Hep A and B up to date, Influenza recommend annual influenza vaccine up-to-date, received COVID booster, prevnar 06/2016, pneumovax 11/2012, Varicella unknown, Zoster recommend Shingrix vaccine, prescription sent 4.Cancer screening I) Colon cancer/dysplasia surveillance:N/A II) Cervical cancer: Up-to-date III) Skin cancer - counseled about annual skin exam by dermatology and skin protection in summer using sun screen SPF > 50, clothing 5.Bone health Vitamin D status: Normal Bone density testing: Not done 5. Labs: Today 6. Smoking: Never smoked 7. NSAIDs and Antibiotics use: None  Follow up in 6 months   Kerry Repress, MD

## 2022-11-23 NOTE — Addendum Note (Signed)
Addended by: Roena Malady on: 11/23/2022 03:08 PM   Modules accepted: Orders

## 2022-11-24 LAB — COMPREHENSIVE METABOLIC PANEL
ALT: 19 IU/L (ref 0–32)
AST: 16 IU/L (ref 0–40)
Albumin/Globulin Ratio: 1.6 (ref 1.2–2.2)
Albumin: 4.5 g/dL (ref 3.9–4.9)
Alkaline Phosphatase: 92 IU/L (ref 44–121)
BUN/Creatinine Ratio: 18 (ref 9–23)
BUN: 14 mg/dL (ref 6–20)
Bilirubin Total: <0.2 mg/dL (ref 0.0–1.2)
CO2: 23 mmol/L (ref 20–29)
Calcium: 9.7 mg/dL (ref 8.7–10.2)
Chloride: 101 mmol/L (ref 96–106)
Creatinine, Ser: 0.8 mg/dL (ref 0.57–1.00)
Globulin, Total: 2.8 g/dL (ref 1.5–4.5)
Glucose: 92 mg/dL (ref 70–99)
Potassium: 4.6 mmol/L (ref 3.5–5.2)
Sodium: 137 mmol/L (ref 134–144)
Total Protein: 7.3 g/dL (ref 6.0–8.5)
eGFR: 100 mL/min/{1.73_m2} (ref >59)

## 2022-11-24 LAB — CBC
Hematocrit: 39.5 % (ref 34.0–46.6)
Hemoglobin: 13.2 g/dL (ref 11.1–15.9)
MCH: 28.5 pg (ref 26.6–33.0)
MCHC: 33.4 g/dL (ref 31.5–35.7)
MCV: 85 fL (ref 79–97)
Platelets: 458 10*3/uL — ABNORMAL HIGH (ref 150–450)
RBC: 4.63 x10E6/uL (ref 3.77–5.28)
RDW: 12.9 % (ref 11.7–15.4)
WBC: 11.8 10*3/uL — ABNORMAL HIGH (ref 3.4–10.8)

## 2022-11-24 LAB — C-REACTIVE PROTEIN: CRP: 10 mg/L (ref 0–10)

## 2022-12-02 ENCOUNTER — Encounter
Admission: RE | Disposition: A | Discharge status: Home / Self Care | Payer: Self-pay | Source: Home / Self Care | Attending: Gastroenterology

## 2022-12-02 ENCOUNTER — Ambulatory Visit: Payer: BC Managed Care – PPO | Admitting: Certified Registered"

## 2022-12-02 ENCOUNTER — Encounter: Payer: Self-pay | Admitting: Gastroenterology

## 2022-12-02 ENCOUNTER — Other Ambulatory Visit: Payer: Self-pay

## 2022-12-02 ENCOUNTER — Telehealth: Payer: Self-pay | Admitting: Nurse Practitioner

## 2022-12-02 ENCOUNTER — Ambulatory Visit
Admission: RE | Admit: 2022-12-02 | Discharge: 2022-12-02 | Disposition: A | Discharge status: Home / Self Care | Payer: BC Managed Care – PPO | Attending: Gastroenterology | Admitting: Gastroenterology

## 2022-12-02 DIAGNOSIS — K219 Gastro-esophageal reflux disease without esophagitis: Secondary | ICD-10-CM | POA: Diagnosis not present

## 2022-12-02 DIAGNOSIS — E039 Hypothyroidism, unspecified: Secondary | ICD-10-CM | POA: Insufficient documentation

## 2022-12-02 DIAGNOSIS — L4050 Arthropathic psoriasis, unspecified: Secondary | ICD-10-CM | POA: Insufficient documentation

## 2022-12-02 DIAGNOSIS — M797 Fibromyalgia: Secondary | ICD-10-CM | POA: Diagnosis not present

## 2022-12-02 DIAGNOSIS — Z980 Intestinal bypass and anastomosis status: Secondary | ICD-10-CM | POA: Diagnosis not present

## 2022-12-02 DIAGNOSIS — K50019 Crohn's disease of small intestine with unspecified complications: Secondary | ICD-10-CM

## 2022-12-02 DIAGNOSIS — Z9884 Bariatric surgery status: Secondary | ICD-10-CM | POA: Diagnosis not present

## 2022-12-02 DIAGNOSIS — K5090 Crohn's disease, unspecified, without complications: Secondary | ICD-10-CM | POA: Diagnosis not present

## 2022-12-02 DIAGNOSIS — J45909 Unspecified asthma, uncomplicated: Secondary | ICD-10-CM | POA: Diagnosis not present

## 2022-12-02 DIAGNOSIS — K5000 Crohn's disease of small intestine without complications: Secondary | ICD-10-CM | POA: Diagnosis not present

## 2022-12-02 DIAGNOSIS — Z9049 Acquired absence of other specified parts of digestive tract: Secondary | ICD-10-CM | POA: Insufficient documentation

## 2022-12-02 DIAGNOSIS — F32A Depression, unspecified: Secondary | ICD-10-CM | POA: Diagnosis not present

## 2022-12-02 DIAGNOSIS — Z8711 Personal history of peptic ulcer disease: Secondary | ICD-10-CM | POA: Diagnosis not present

## 2022-12-02 HISTORY — PX: COLONOSCOPY WITH PROPOFOL: SHX5780

## 2022-12-02 LAB — POCT PREGNANCY, URINE: Preg Test, Ur: NEGATIVE

## 2022-12-02 SURGERY — COLONOSCOPY WITH PROPOFOL
Anesthesia: General

## 2022-12-02 MED ORDER — DEXMEDETOMIDINE HCL IN NACL 200 MCG/50ML IV SOLN
INTRAVENOUS | Status: DC | PRN
  Administered 2022-12-02: 20 ug via INTRAVENOUS

## 2022-12-02 MED ORDER — SODIUM CHLORIDE 0.9 % IV SOLN: INTRAVENOUS | Status: DC

## 2022-12-02 MED ORDER — PROPOFOL 500 MG/50ML IV EMUL
INTRAVENOUS | Status: DC | PRN
  Administered 2022-12-02: 150 ug/kg/min via INTRAVENOUS

## 2022-12-02 MED ORDER — PROPOFOL 10 MG/ML IV BOLUS
INTRAVENOUS | Status: DC | PRN
  Administered 2022-12-02 (×3): 20 mg via INTRAVENOUS
  Administered 2022-12-02: 80 mg via INTRAVENOUS
  Administered 2022-12-02: 120 mg via INTRAVENOUS

## 2022-12-02 NOTE — Telephone Encounter (Signed)
Form completed and placed up front in the "L" folder for patient to pick up. Please make her aware

## 2022-12-02 NOTE — Op Note (Signed)
[facility] Gastroenterology Patient Name: Kerry Martinez Procedure Date: 12/02/2022 2:03 PM MRN: [id number] Account #: [id number] Date of Birth: 08-Feb-1986 Admit Type: Outpatient Age: 36 Room: [facility] ENDO ROOM 3 Gender: Female Note Status: Finalized Instrument Name: Nelda Marseille [id number] Procedure:             Colonoscopy Indications:           Disease activity assessment of Crohn's disease of the                         small bowel, Assess therapeutic response to therapy of                         Crohn's disease of the small bowel Providers:             Toney Reil MD, MD Referring MD:          Genene Churn. Toney Reil (Referring MD) Medicines:             General Anesthesia Complications:         No immediate complications. Estimated blood loss: None. Procedure:             Pre-Anesthesia Assessment:                        - Prior to the procedure, a History and Physical was                         performed, and patient medications and allergies were                         reviewed. The patient is competent. The risks and                         benefits of the procedure and the sedation options and                         risks were discussed with the patient. All questions                         were answered and informed consent was obtained.                         Patient identification and proposed procedure were                         verified by the physician, the nurse, the                         anesthesiologist, the anesthetist and the technician                         in the pre-procedure area in the procedure room in the                         endoscopy suite. Mental Status Examination: alert and                         oriented. Airway Examination: normal oropharyngeal  airway and neck mobility. Respiratory Examination:                         clear to auscultation. CV Examination: normal.                          Prophylactic Antibiotics: The patient does not require                         prophylactic antibiotics. Prior Anticoagulants: The                         patient has taken no anticoagulant or antiplatelet                         agents. ASA Grade Assessment: III - A patient with                         severe systemic disease. After reviewing the risks and                         benefits, the patient was deemed in satisfactory                         condition to undergo the procedure. The anesthesia                         plan was to use general anesthesia. Immediately prior                         to administration of medications, the patient was                         re-assessed for adequacy to receive sedatives. The                         heart rate, respiratory rate, oxygen saturations,                         blood pressure, adequacy of pulmonary ventilation, and                         response to care were monitored throughout the                         procedure. The physical status of the patient was                         re-assessed after the procedure.                        After obtaining informed consent, the colonoscope was                         passed under direct vision. Throughout the procedure,                         the patient's blood pressure, pulse, and oxygen  saturations were monitored continuously. The                         Colonoscope was introduced through the anus and                         advanced to the the ileocolonic anastomosis. The                         colonoscopy was performed without difficulty. The                         patient tolerated the procedure well. The quality of                         the bowel preparation was evaluated using the BBPS                         [facility] Bowel Preparation Scale) with scores of: Right                         Colon = 3, Transverse Colon = 3 and Left Colon = 3                          (entire mucosa seen well with no residual staining,                         small fragments of stool or opaque liquid). The total                         BBPS score equals 9. The terminal ileum, ileocecal                         valve, appendiceal orifice, and rectum were                         photographed. Findings:      The perianal and digital rectal examinations were normal. Pertinent       negatives include normal sphincter tone and no palpable rectal lesions.      There was evidence of a prior end-to-side ileo-colonic anastomosis in       the cecum. This was patent and was characterized by healthy appearing       mucosa. The anastomosis was traversed.      The neo-terminal ileum appeared normal.      The entire examined colon appeared normal.      The retroflexed view of the distal rectum and anal verge was normal and       showed no anal or rectal abnormalities. Impression:            - Patent end-to-side ileo-colonic anastomosis,                         characterized by healthy appearing mucosa.                        - The examined portion of the ileum was normal.                        -  The entire examined colon is normal.                        - The distal rectum and anal verge are normal on                         retroflexion view.                        - No specimens collected. Recommendation:        - Discharge patient to home (with escort).                        - Resume previous diet today.                        - Continue present medications.                        - Return to my office as previously scheduled. Procedure Code(s):     --- Professional ---                        [phone], Colonoscopy, flexible; diagnostic, including                         collection of specimen(s) by brushing or washing, when                         performed (separate procedure) Diagnosis Code(s):     --- Professional ---                        Z98.0, Intestinal bypass  and anastomosis status                        K50.00, Crohn's disease of small intestine without                         complications CPT copyright 2022 American Medical Association. All rights reserved. The codes documented in this report are preliminary and upon coder review may  be revised to meet current compliance requirements. Dr. Libby Maw Toney Reil MD, MD 12/02/2022 2:22:44 PM This report has been signed electronically. Number of Addenda: 0 Note Initiated On: 12/02/2022 2:03 PM Scope Withdrawal Time: 0 hours 6 minutes 46 seconds  Total Procedure Duration: 0 hours 10 minutes 27 seconds  Estimated Blood Loss:  Estimated blood loss: none.      [facility]

## 2022-12-02 NOTE — Transfer of Care (Signed)
Immediate Anesthesia Transfer of Care Note  Patient: Kerry Martinez  Procedure(s) Performed: COLONOSCOPY WITH PROPOFOL  Patient Location: Endoscopy Unit  Anesthesia Type:General  Level of Consciousness: sedated  Airway & Oxygen Therapy: Patient Spontanous Breathing  Post-op Assessment: Report given to RN and Post -op Vital signs reviewed and stable  Post vital signs: Reviewed and stable  Last Vitals:  Vitals Value Taken Time  BP 120/69 12/02/22 1425  Temp 36.1 C 12/02/22 1425  Pulse 83 12/02/22 1425  Resp 28 12/02/22 1425  SpO2 99 % 12/02/22 1425  Vitals shown include unvalidated device data.  Last Pain:  Vitals:   12/02/22 1425  TempSrc: Temporal  PainSc: Asleep         Complications: No notable events documented.

## 2022-12-02 NOTE — Telephone Encounter (Signed)
Placed form in Kerry Martinez's box.

## 2022-12-02 NOTE — Anesthesia Preprocedure Evaluation (Signed)
Anesthesia Evaluation  Patient identified by MRN, date of birth, ID band Patient awake    Reviewed: Allergy & Precautions, NPO status , Patient's Chart, lab work & pertinent test results  History of Anesthesia Complications Negative for: history of anesthetic complications  Airway Mallampati: III  TM Distance: >3 FB Neck ROM: full    Dental  (+) Chipped   Pulmonary neg shortness of breath, asthma    Pulmonary exam normal        Cardiovascular Exercise Tolerance: Good (-) angina (-) Past MI negative cardio ROS Normal cardiovascular exam     Neuro/Psych  Headaches  Neuromuscular disease  negative psych ROS   GI/Hepatic Neg liver ROS, PUD,GERD  Controlled,,  Endo/Other  Hypothyroidism    Renal/GU Renal disease  negative genitourinary   Musculoskeletal   Abdominal   Peds  Hematology negative hematology ROS (+)   Anesthesia Other Findings Past Medical History: No date: Allergy No date: Anemia No date: Asthma No date: Crohn's disease (HCC) No date: Depression No date: Fibromyalgia No date: Migraine 12/27/2010: Polycystic ovarian syndrome     Comment:  s/p endocrinology consult/[facility]. No date: Psoriatic arthritis [facility])  Past Surgical History: 11/10/12: Admission     Comment:  coffee ground emesis, abdominal pain.  EGD with 3               gastric ulcers and HH.  s/p lab band reversal.  [facility].  GI consults, Bariatric Surgery Consult. No date: COLONOSCOPY W/ BIOPSIES 2004: ear graph 11/12/12: ESOPHAGOGASTRODUODENOSCOPY     Comment:  3 gastric ulcers, hiatal hernia.  [facility]. 04/06/2011: Lap band procedure 11/12/12: Lap Band Reversal     Comment:  [facility] 03/27/2013: LAPAROSCOPIC GASTRIC SLEEVE RESECTION 1991: MASTOIDECTOMY 12/26/12: PARTIAL COLECTOMY     Comment:  Crohn's disease resection; [facility]. [facility] 11/30/2021: PORTA CATH INSERTION; N/A     Comment:  Procedure:  PORTA CATH INSERTION;  Surgeon: Annice Needy,              MD;  Location: [facility] INVASIVE CV LAB;  Service:               Cardiovascular;  Laterality: N/A; 1997: TONSILLECTOMY 1990: TYMPANOSTOMY TUBE PLACEMENT  BMI    Body Mass Index: 61.33 kg/m      Reproductive/Obstetrics negative OB ROS                             Anesthesia Physical Anesthesia Plan  ASA: 3  Anesthesia Plan: General   Post-op Pain Management:    Induction: Intravenous  PONV Risk Score and Plan: Propofol infusion and TIVA  Airway Management Planned: Natural Airway and Nasal Cannula  Additional Equipment:   Intra-op Plan:   Post-operative Plan:   Informed Consent: I have reviewed the patients History and Physical, chart, labs and discussed the procedure including the risks, benefits and alternatives for the proposed anesthesia with the patient or authorized representative who has indicated his/her understanding and acceptance.     Dental Advisory Given  Plan Discussed with: Anesthesiologist, CRNA and Surgeon  Anesthesia Plan Comments: (Patient consented for risks of anesthesia including but not limited to:  - adverse reactions to medications - risk of airway placement if required - damage to eyes, teeth, lips or other oral mucosa - nerve damage due to positioning  - sore throat or hoarseness - Damage to  heart, brain, nerves, lungs, other parts of body or loss of life  Patient voiced understanding.)       Anesthesia Quick Evaluation

## 2022-12-02 NOTE — H&P (Signed)
[name], MD [address]  [address]  [place], [place] [address]  Main: [phone]  Fax: [phone] Pager: [phone]  Primary Care Physician:  Eden Emms, NP Primary Gastroenterologist:  Dr. [name]  Pre-Procedure History & Physical: HPI:  Kerry Martinez is a 36 y.o. female is here for an colonoscopy.   Past Medical History:  Diagnosis Date   Allergy    Anemia    Asthma    Crohn's disease (HCC)    Depression    Fibromyalgia    Migraine    Polycystic ovarian syndrome 12/27/2010   s/p endocrinology consult/[facility].   Psoriatic arthritis [facility])     Past Surgical History:  Procedure Laterality Date   Admission  11/10/12   coffee ground emesis, abdominal pain.  EGD with 3 gastric ulcers and HH.  s/p lab band reversal.  [facility].  GI consults, Bariatric Surgery Consult.   COLONOSCOPY W/ BIOPSIES     ear graph  2004   ESOPHAGOGASTRODUODENOSCOPY  11/12/12   3 gastric ulcers, hiatal hernia.  [facility]   Lap band procedure  04/06/2011   Lap Band Reversal  11/12/12   [facility]   LAPAROSCOPIC GASTRIC SLEEVE RESECTION  03/27/2013   MASTOIDECTOMY  1991   PARTIAL COLECTOMY  12/26/12   Crohn's disease resection; [facility]. [facility]   PORTA CATH INSERTION N/A 11/30/2021   Procedure: PORTA CATH INSERTION;  Surgeon: Annice Needy, MD;  Location: [facility] INVASIVE CV LAB;  Service: Cardiovascular;  Laterality: N/A;   TONSILLECTOMY  1997   TYMPANOSTOMY TUBE PLACEMENT  1990    Prior to Admission medications   Medication Sig Start Date End Date Taking? Authorizing Provider  ARIPiprazole (ABILIFY) 10 MG tablet Take 10 mg by mouth daily. 10/29/22  Yes [provider]  ASMANEX, 120 METERED DOSES, 220 MCG/ACT inhaler Inhale into the lungs. 09/15/22  Yes [provider]  azelastine (ASTELIN) 0.1 % nasal spray Place 1 spray into both nostrils 2 (two) times daily. Use in each nostril as directed 11/10/22  Yes Burnette, Alessandra Bevels, PA-C   cetirizine (ZYRTEC ALLERGY) 10 MG tablet 1 tablet   Yes [provider]  cyanocobalamin (,VITAMIN B-12,) 1000 MCG/ML injection Inject 1,000 mcg into the muscle every 30 (thirty) days. 06/01/21  Yes [provider]  doxepin (SINEQUAN) 10 MG capsule 1 capsule at bedtime   Yes [provider]  famotidine (PEPCID) 40 MG tablet Take 40 mg by mouth 2 (two) times daily. 05/26/22  Yes [provider]  fluticasone (FLONASE) 50 MCG/ACT nasal spray SPRAY 1 TO 2 SPRAYS INTO EACH NOSTRIL ONCE A DAY 02/25/22  Yes [provider]  folic acid (FOLVITE) 1 MG tablet Take by mouth.   Yes [provider]  gabapentin (NEURONTIN) 600 MG tablet Take 1 tablet (600 mg total) by mouth daily. 11/04/22  Yes Eden Emms, NP  inFLIXimab (REMICADE) 100 MG injection Inject into the vein.   Yes [provider]  levocetirizine (XYZAL ALLERGY 24HR) 5 MG tablet Xyzal 5 mg tablet  Take 1 tablet every day by oral route.   Yes [provider]  levothyroxine (SYNTHROID) 112 MCG tablet TAKE 1 TABLET BY MOUTH DAILY BEFORE BREAKFAST. 08/05/22  Yes Eden Emms, NP  Methotrexate, PF, (RASUVO) 20 MG/0.4ML SOAJ See admin instructions.   Yes [provider]  montelukast (SINGULAIR) 10 MG tablet TAKE 1 TABLET BY MOUTH EVERYDAY AT BEDTIME 09/24/22  Yes Eden Emms, NP  Multiple Vitamins-Minerals (MULTIVITAMINS  THER. W/MINERALS) TABS Take 1 tablet by mouth daily.   Yes [provider]  Nutritional Supplements (VITAMIN D PLUS COFACTORS) TABS See admin instructions.   Yes [provider]  omalizumab Geoffry Paradise) 150 MG/ML prefilled syringe Inject into the skin. 03/02/22  Yes [provider]  prazosin (MINIPRESS) 2 MG capsule Take 4 capsules by mouth daily. 08/20/21  Yes [provider]  venlafaxine (EFFEXOR) 100 MG tablet Take 3 tablets once daily   Yes [provider]  Vitamin D, Ergocalciferol, (DRISDOL) 1.25 MG (50000 UNIT)  CAPS capsule TAKE 1 CAPSULE (50,000 UNITS TOTAL) BY MOUTH EVERY 7 (SEVEN) DAYS 06/30/22  Yes Eden Emms, NP  albuterol (VENTOLIN HFA) 108 (90 Base) MCG/ACT inhaler Inhale 2 puffs into the lungs. every 4 to 6 hours as needed 12/31/18   [provider]  budesonide (PULMICORT FLEXHALER) 180 MCG/ACT inhaler Inhale into the lungs.    [provider]  EPINEPHrine 0.3 mg/0.3 mL IJ SOAJ injection See admin instructions. 11/19/19   [provider]  hydrocortisone 2.5 % ointment As needed for psoriasis 09/10/20   [provider]  ketotifen (ZADITOR) 0.025 % ophthalmic solution 1 drop into affected eye    [provider]  levonorgestrel (MIRENA) 20 MCG/DAY IUD 1 each by Intrauterine route once.    [provider]  meclizine (ANTIVERT) 12.5 MG tablet Take 1 tablet (12.5 mg total) by mouth 2 (two) times daily as needed for dizziness. 08/25/22   Eden Emms, NP  ondansetron (ZOFRAN) 4 MG tablet Take 4 mg by mouth 3 (three) times daily as needed. 04/21/21   [provider]  Sodium Sulfate-Mag Sulfate-KCl (SUTAB) [phone] MG TABS Per instructions given 11/23/22   Toney Reil, MD  tacrolimus (PROTOPIC) 0.1 % ointment Apply topically daily as needed. 01/24/22   [provider]    Allergies as of 11/23/2022 - Review Complete 11/10/2022  Allergen Reaction Noted   Amoxicillin Hives 06/25/2011   Cefprozil Hives 06/25/2011   Iodinated contrast media Anaphylaxis 11/13/2020   Abacavir Itching 09/14/2019   Cephalosporins Hives and Itching 01/30/2012   Cat hair extract  08/25/2021   Multihance [gadobenate]  08/16/2013   Sulfa antibiotics Hives and Other (See Comments) 08/02/2018    Family History  Problem Relation Age of Onset   ALS Mother    Cancer Mother        precancerous polyps in colon   Asthma Mother    Depression Mother    Diabetes Mother    Hyperlipidemia Mother    Hypertension Mother    Heart disease Father     Diabetes Father    Hypertension Father    Hyperlipidemia Father    Other Sister        fibromyalgia   Fibromyalgia Sister    Hypertension Brother    Kidney disease Maternal Grandmother    Diabetes Maternal Grandmother    Heart disease Maternal Grandfather    Kidney disease Maternal Grandfather    Diabetes Maternal Grandfather    Kidney disease Paternal Grandmother    Diabetes Paternal Grandmother    Heart disease Paternal Grandfather    Diabetes Paternal Grandfather    Parkinson's disease Paternal Grandfather     Social History   Socioeconomic History   Marital status: Single    Spouse name: Not on file   Number of children: Not on file   Years of education: Not on file   Highest education level: Not on file  Occupational History   Not  on file  Tobacco Use   Smoking status: Never   Smokeless tobacco: Never  Vaping Use   Vaping Use: Never used  Substance and Sexual Activity   Alcohol use: Yes    Comment: socially   Drug use: No   Sexual activity: Not Currently  Other Topics Concern   Not on file  Social History Narrative   Marital status: single; not dating.      Children: none      Lives: with parents.      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      Education: [employer] in fall 2014.      Tobacco: none      Alcohol: socially; rarely.      Drugs :none      Exercise: sporadic exercise due to Crohn's disease.  Walking three times per week.      Sexual activity: one partner.  Last sexual activity 2 years ago.  No STDs.      Seatbelt:  100% of time.      Sunscreen: SPF 70.   Social Determinants of Health   Financial Resource Strain: Not on file  Food Insecurity: Not on file  Transportation Needs: Not on file  Physical Activity: Not on file  Stress: Not on file  Social Connections: Not on file  Intimate Partner Violence: Not on file    Review of Systems: See HPI, otherwise negative  ROS  Physical Exam: BP (!) 152/96   Pulse 87   Temp 97.6 F (36.4 C) (Tympanic)   Resp 18   Ht 5\' 6"  (1.676 m)   Wt (!) 172.4 kg   SpO2 100%   BMI 61.33 kg/m  General:   Alert,  pleasant and cooperative in NAD Head:  Normocephalic and atraumatic. Neck:  Supple; no masses or thyromegaly. Lungs:  Clear throughout to auscultation.    Heart:  Regular rate and rhythm. Abdomen:  Soft, nontender and nondistended. Normal bowel sounds, without guarding, and without rebound.   Neurologic:  Alert and  oriented x4;  grossly normal neurologically.  Impression/Plan: Kerry Martinez is here for an colonoscopy to be performed for h/o ileal crohn's  Risks, benefits, limitations, and alternatives regarding  colonoscopy have been reviewed with the patient.  Questions have been answered.  All parties agreeable.   Rogelia Boga, MD  12/02/2022, 1:29 PM

## 2022-12-02 NOTE — Telephone Encounter (Signed)
Type of forms received: Parking Placard  Routed QA:ESLPN [employer] received by : Lesly Rubenstein   Individual made aware of 3-5 business day turn around (Y/N): Y  Form completed and patient made aware of charges(Y/N): Y   Faxed to :   Form location: Place in PCP folder

## 2022-12-02 NOTE — Anesthesia Procedure Notes (Signed)
Procedure Name: MAC Date/Time: 12/02/2022 2:05 PM  Performed by: Biagio Borg, CRNAPre-anesthesia Checklist: Patient identified, Emergency Drugs available, Suction available, Patient being monitored and Timeout performed Patient Re-evaluated:Patient Re-evaluated prior to induction Oxygen Delivery Method: Nasal cannula Induction Type: IV induction Placement Confirmation: positive ETCO2 and CO2 detector

## 2022-12-02 NOTE — Telephone Encounter (Signed)
Spoke with pt notifying her the form is ready to pick up.  Expresses her thanks and will get it tomorrow.

## 2022-12-03 ENCOUNTER — Encounter: Payer: Self-pay | Admitting: Gastroenterology

## 2022-12-03 DIAGNOSIS — F4312 Post-traumatic stress disorder, chronic: Secondary | ICD-10-CM | POA: Diagnosis not present

## 2022-12-03 DIAGNOSIS — F329 Major depressive disorder, single episode, unspecified: Secondary | ICD-10-CM | POA: Diagnosis not present

## 2022-12-03 NOTE — Anesthesia Postprocedure Evaluation (Signed)
Anesthesia Post Note  Patient: Kerry Martinez  Procedure(s) Performed: COLONOSCOPY WITH PROPOFOL  Patient location during evaluation: Endoscopy Anesthesia Type: General Level of consciousness: awake and alert Pain management: pain level controlled Vital Signs Assessment: post-procedure vital signs reviewed and stable Respiratory status: spontaneous breathing, nonlabored ventilation, respiratory function stable and patient connected to nasal cannula oxygen Cardiovascular status: blood pressure returned to baseline and stable Postop Assessment: no apparent nausea or vomiting Anesthetic complications: no   No notable events documented.   Last Vitals:  Vitals:   12/02/22 1310 12/02/22 1425  BP: (!) 152/96   Pulse: 87   Resp: 18   Temp: 36.4 C (!) 36.1 C  SpO2: 100%     Last Pain:  Vitals:   12/02/22 1445  TempSrc:   PainSc: 0-No pain                 Cleda Mccreedy Kaveri Perras

## 2022-12-07 DIAGNOSIS — F4312 Post-traumatic stress disorder, chronic: Secondary | ICD-10-CM | POA: Diagnosis not present

## 2022-12-07 DIAGNOSIS — F329 Major depressive disorder, single episode, unspecified: Secondary | ICD-10-CM | POA: Diagnosis not present

## 2022-12-10 DIAGNOSIS — Z79899 Other long term (current) drug therapy: Secondary | ICD-10-CM | POA: Diagnosis not present

## 2022-12-10 DIAGNOSIS — K50019 Crohn's disease of small intestine with unspecified complications: Secondary | ICD-10-CM | POA: Diagnosis not present

## 2022-12-10 DIAGNOSIS — L409 Psoriasis, unspecified: Secondary | ICD-10-CM | POA: Diagnosis not present

## 2022-12-10 DIAGNOSIS — L4050 Arthropathic psoriasis, unspecified: Secondary | ICD-10-CM | POA: Diagnosis not present

## 2022-12-13 ENCOUNTER — Other Ambulatory Visit: Payer: Self-pay | Admitting: Nurse Practitioner

## 2022-12-13 DIAGNOSIS — E559 Vitamin D deficiency, unspecified: Secondary | ICD-10-CM

## 2022-12-22 DIAGNOSIS — F329 Major depressive disorder, single episode, unspecified: Secondary | ICD-10-CM | POA: Diagnosis not present

## 2022-12-22 DIAGNOSIS — K50919 Crohn's disease, unspecified, with unspecified complications: Secondary | ICD-10-CM | POA: Diagnosis not present

## 2022-12-22 DIAGNOSIS — F4312 Post-traumatic stress disorder, chronic: Secondary | ICD-10-CM | POA: Diagnosis not present

## 2022-12-28 DIAGNOSIS — K50919 Crohn's disease, unspecified, with unspecified complications: Secondary | ICD-10-CM | POA: Diagnosis not present

## 2023-01-06 DIAGNOSIS — Z881 Allergy status to other antibiotic agents status: Secondary | ICD-10-CM | POA: Diagnosis not present

## 2023-01-06 DIAGNOSIS — Z882 Allergy status to sulfonamides status: Secondary | ICD-10-CM | POA: Diagnosis not present

## 2023-01-06 DIAGNOSIS — F4312 Post-traumatic stress disorder, chronic: Secondary | ICD-10-CM | POA: Diagnosis not present

## 2023-01-06 DIAGNOSIS — H9201 Otalgia, right ear: Secondary | ICD-10-CM | POA: Diagnosis not present

## 2023-01-06 DIAGNOSIS — H6121 Impacted cerumen, right ear: Secondary | ICD-10-CM | POA: Diagnosis not present

## 2023-01-06 DIAGNOSIS — Z880 Allergy status to penicillin: Secondary | ICD-10-CM | POA: Diagnosis not present

## 2023-01-06 DIAGNOSIS — H9203 Otalgia, bilateral: Secondary | ICD-10-CM | POA: Diagnosis not present

## 2023-01-06 DIAGNOSIS — Z91041 Radiographic dye allergy status: Secondary | ICD-10-CM | POA: Diagnosis not present

## 2023-01-06 DIAGNOSIS — F329 Major depressive disorder, single episode, unspecified: Secondary | ICD-10-CM | POA: Diagnosis not present

## 2023-01-06 DIAGNOSIS — M26623 Arthralgia of bilateral temporomandibular joint: Secondary | ICD-10-CM | POA: Diagnosis not present

## 2023-01-18 DIAGNOSIS — F4312 Post-traumatic stress disorder, chronic: Secondary | ICD-10-CM | POA: Diagnosis not present

## 2023-01-18 DIAGNOSIS — F329 Major depressive disorder, single episode, unspecified: Secondary | ICD-10-CM | POA: Diagnosis not present

## 2023-01-21 ENCOUNTER — Other Ambulatory Visit: Payer: Self-pay

## 2023-02-06 ENCOUNTER — Other Ambulatory Visit: Payer: Self-pay | Admitting: Nurse Practitioner

## 2023-02-10 DIAGNOSIS — F4312 Post-traumatic stress disorder, chronic: Secondary | ICD-10-CM | POA: Diagnosis not present

## 2023-02-10 DIAGNOSIS — F329 Major depressive disorder, single episode, unspecified: Secondary | ICD-10-CM | POA: Diagnosis not present

## 2023-02-11 ENCOUNTER — Other Ambulatory Visit: Payer: Self-pay | Admitting: Interventional Radiology

## 2023-02-11 DIAGNOSIS — T829XXA Unspecified complication of cardiac and vascular prosthetic device, implant and graft, initial encounter: Secondary | ICD-10-CM

## 2023-02-12 ENCOUNTER — Ambulatory Visit
Admission: EM | Admit: 2023-02-12 | Discharge: 2023-02-12 | Disposition: A | Discharge status: Home / Self Care | Payer: BC Managed Care – PPO | Attending: Urgent Care | Admitting: Urgent Care

## 2023-02-12 DIAGNOSIS — J45901 Unspecified asthma with (acute) exacerbation: Secondary | ICD-10-CM

## 2023-02-12 DIAGNOSIS — J069 Acute upper respiratory infection, unspecified: Secondary | ICD-10-CM | POA: Diagnosis not present

## 2023-02-12 MED ORDER — AZITHROMYCIN 250 MG PO TABS: 250.0000 mg | ORAL_TABLET | Freq: Every day | ORAL | 0 refills | Status: DC

## 2023-02-12 MED ORDER — PREDNISONE 20 MG PO TABS: 40.0000 mg | ORAL_TABLET | Freq: Every day | ORAL | 0 refills | Status: AC

## 2023-02-12 NOTE — Discharge Instructions (Addendum)
Follow up here or with your primary care provider if your symptoms are worsening or not improving.     

## 2023-02-12 NOTE — ED Triage Notes (Signed)
Patient presents to UC for cough, sore throat since Tuesday, nasal congestion since Thurs, chest discomfort with breathing since this morning. Hx of asthma. Taking mucinex. Has not used her inhaler.

## 2023-02-12 NOTE — ED Provider Notes (Signed)
[facility]    CSN: [id number] Arrival date & time: 02/12/23  0818      History   Chief Complaint Chief Complaint  Patient presents with   Cough   Nasal Congestion   Headache   Fever   Sore Throat    HPI Kerry Martinez is a 37 y.o. female.    Cough Associated symptoms: fever and headaches   Headache Associated symptoms: cough and fever   Fever Associated symptoms: cough and headaches   Sore Throat Associated symptoms include headaches.    Patient presents to urgent care with complaint of cough and sore throat x 4 days.  Also reports nasal congestion x 2 days.  She endorses chest "discomfort" and tightness with breathing since this morning. Using Mucinex for symptoms.  Endorses history of asthma.  Not using rescue inhaler. Using pulmicort as directed.  Past Medical History:  Diagnosis Date   Allergy    Anemia    Asthma    Crohn's disease ([place])    Depression    Fibromyalgia    Migraine    Polycystic ovarian syndrome 12/27/2010   s/p endocrinology consult/[facility].   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   Degenerative disc disease, lumbar 10/31/2015   Anemia, iron deficiency 06/25/2014   Insomnia 06/25/2014   H/O surgical procedure 07/13/2013   Anemia, unspecified 01/29/2013   Vitamin B12 deficiency 01/29/2013   Folate deficiency 01/29/2013   Peptic ulcer disease 11/27/2012   Abdominal pain 11/27/2012   Depression 11/27/2012   Nausea and vomiting 11/27/2012   Need for prophylactic vaccination against  Streptococcus pneumoniae (pneumococcus) 11/27/2012   Crohn's disease ([place]) 09/20/2012   Morbid obesity ([place]) 08/07/2012   Bilateral polycystic ovarian syndrome 08/07/2012   Crohn's disease, small intestine ([place]) 08/07/2012   Lapband APL April 2012 03/02/2012   MRSA (methicillin resistant staph aureus) culture positive 06/25/2011   Asthma 06/25/2011   Migraines 06/25/2011   Family history of breast cancer 06/25/2011    Past Surgical History:  Procedure Laterality Date   Admission  11/10/12   coffee ground emesis, abdominal pain.  EGD with 3 gastric ulcers and HH.  s/p lab band reversal.  [facility].  GI consults, Bariatric Surgery Consult.   COLONOSCOPY W/ BIOPSIES     COLONOSCOPY WITH PROPOFOL N/A 12/02/2022   Procedure: COLONOSCOPY WITH PROPOFOL;  Surgeon: Lin Landsman, MD;  Location: [facility] ENDOSCOPY;  Service: Gastroenterology;  Laterality: N/A;   ear graph  2004   ESOPHAGOGASTRODUODENOSCOPY  11/12/12   3 gastric ulcers, hiatal hernia.  [facility].   Lap band procedure  04/06/2011   Lap Band Reversal  11/12/12   [place] GASTRIC SLEEVE RESECTION  03/27/2013   MASTOIDECTOMY  1991   PARTIAL COLECTOMY  12/26/12   Crohn's disease resection; [place]. [facility]   PORTA CATH INSERTION N/A 11/30/2021   Procedure: PORTA CATH INSERTION;  Surgeon: Algernon Huxley, MD;  Location: [place] CV LAB;  Service: Cardiovascular;  Laterality: N/A;   [place]    OB History  No obstetric history on file.      Home Medications    Prior to Admission medications   Medication Sig Start Date End Date Taking? Authorizing Provider  albuterol (VENTOLIN HFA) 108 (90 Base) MCG/ACT inhaler Inhale 2 puffs into the lungs. every 4 to 6 hours as needed 12/31/18   [provider]  ARIPiprazole (ABILIFY) 10 MG tablet Take 10 mg by mouth daily. 10/29/22   [provider]  ASMANEX, 120 METERED DOSES, 220 MCG/ACT  inhaler Inhale into the lungs. 09/15/22   [provider]  azelastine (ASTELIN) 0.1 % nasal spray Place 1 spray into both nostrils 2 (two) times daily. Use in each nostril as directed 11/10/22   Mar Daring, PA-C  budesonide (PULMICORT FLEXHALER) 180 MCG/ACT inhaler Inhale into the lungs.    [provider]  cetirizine (ZYRTEC ALLERGY) 10 MG tablet 1 tablet    [provider]  cyanocobalamin (,VITAMIN B-12,) 1000 MCG/ML injection Inject 1,000 mcg into the muscle every 30 (thirty) days. 06/01/21   [provider]  doxepin (SINEQUAN) 10 MG capsule 1 capsule at bedtime    [provider]  EPINEPHrine 0.3 mg/0.3 mL IJ SOAJ injection See admin instructions. 11/19/19   [provider]  famotidine (PEPCID) 40 MG tablet Take 40 mg by mouth 2 (two) times daily. 05/26/22   [provider]  fluticasone (FLONASE) 50 MCG/ACT nasal spray SPRAY 1 TO 2 SPRAYS INTO EACH NOSTRIL ONCE A DAY 02/25/22   [provider]  folic acid (FOLVITE) 1 MG tablet Take by mouth.    [provider]  gabapentin (NEURONTIN) 600 MG tablet Take 1 tablet (600 mg total) by mouth daily. 11/04/22   Michela Pitcher, NP  hydrocortisone 2.5 % ointment As needed for psoriasis 09/10/20   [provider]  inFLIXimab (REMICADE) 100 MG injection Inject into the vein.    [provider]  ketotifen (ZADITOR) 0.025 % ophthalmic solution 1 drop into affected eye    [provider]  levocetirizine (XYZAL ALLERGY 24HR) 5 MG tablet Xyzal 5 mg tablet  Take 1 tablet every day by oral route.    [provider]  levonorgestrel (MIRENA) 20 MCG/DAY IUD 1 each by Intrauterine route once.    [provider]  levothyroxine (SYNTHROID) 112 MCG tablet TAKE 1 TABLET BY MOUTH EVERY DAY BEFORE BREAKFAST 02/07/23   Michela Pitcher, NP  meclizine (ANTIVERT) 12.5 MG tablet Take 1 tablet (12.5 mg total) by mouth 2 (two) times daily as needed for  dizziness. 08/25/22   Michela Pitcher, NP  Methotrexate, PF, (RASUVO) 20 MG/0.4ML SOAJ See admin instructions.    [provider]  montelukast (SINGULAIR) 10 MG tablet TAKE 1 TABLET BY MOUTH EVERYDAY AT BEDTIME 09/24/22   Michela Pitcher, NP  Multiple Vitamins-Minerals (MULTIVITAMINS THER. W/MINERALS) TABS Take 1 tablet by mouth daily.    [provider]  Nutritional Supplements (VITAMIN D PLUS COFACTORS) TABS See admin instructions.    [provider]  omalizumab Arvid Right) 150 MG/ML prefilled syringe Inject into the skin. 03/02/22   [provider]  ondansetron (ZOFRAN) 4 MG tablet Take 4 mg by mouth 3 (three) times daily as needed. 04/21/21   [provider]  prazosin (MINIPRESS) 2 MG capsule Take 4 capsules by mouth daily. 08/20/21   [provider]  Sodium Sulfate-Mag Sulfate-KCl (SUTAB) [phone] MG TABS Per instructions given 11/23/22   Lin Landsman, MD  tacrolimus (PROTOPIC) 0.1 % ointment Apply topically daily  as needed. 01/24/22   [provider]  venlafaxine (EFFEXOR) 100 MG tablet Take 3 tablets once daily    [provider]  Vitamin D, Ergocalciferol, (DRISDOL) 1.25 MG (50000 UNIT) CAPS capsule TAKE 1 CAPSULE (50,000 UNITS TOTAL) BY MOUTH EVERY 7 (SEVEN) DAYS 12/14/22   Michela Pitcher, NP    Family History Family History  Problem Relation Age of Onset   ALS Mother    Cancer Mother        precancerous polyps in colon   Asthma Mother    Depression Mother    Diabetes Mother    Hyperlipidemia Mother    Hypertension Mother    Heart disease Father    Diabetes Father    Hypertension Father    Hyperlipidemia Father    Other Sister        fibromyalgia   Fibromyalgia Sister    Hypertension Brother    Kidney disease Maternal Grandmother    Diabetes Maternal Grandmother    Heart disease Maternal Grandfather    Kidney disease Maternal Grandfather    Diabetes Maternal Grandfather    Kidney disease Paternal  Grandmother    Diabetes Paternal Grandmother    Heart disease Paternal Grandfather    Diabetes Paternal Grandfather    Parkinson's disease Paternal Grandfather     Social History Social History   Tobacco Use   Smoking status: Never   Smokeless tobacco: Never  Vaping Use   Vaping Use: Never used  Substance Use Topics   Alcohol use: Yes    Comment: socially   Drug use: No     Allergies   Amoxicillin, Cefprozil, Iodinated contrast media, Abacavir, Cephalosporins, Cat hair extract, Multihance [gadobenate], and Sulfa antibiotics   Review of Systems Review of Systems  Constitutional:  Positive for fever.  Respiratory:  Positive for cough.   Neurological:  Positive for headaches.     Physical Exam Triage Vital Signs ED Triage Vitals  Enc Vitals Group     BP      Pulse      Resp      Temp      Temp src      SpO2      Weight      Height      Head Circumference      Peak Flow      Pain Score      Pain Loc      Pain Edu?      Excl. in [place]?    No data found.  Updated Vital Signs There were no vitals taken for this visit.  Visual Acuity Right Eye Distance:   Left Eye Distance:   Bilateral Distance:    Right Eye Near:   Left Eye Near:    Bilateral Near:     Physical Exam Vitals reviewed.  Constitutional:      Appearance: She is well-developed. She is ill-appearing.  HENT:     Mouth/Throat:     Pharynx: No oropharyngeal exudate or posterior oropharyngeal erythema.     Tonsils: No tonsillar exudate.  Cardiovascular:     Rate and Rhythm: Normal rate and regular rhythm.     Heart sounds: Normal heart sounds.  Pulmonary:     Effort: Pulmonary effort is normal.     Breath sounds: Normal breath sounds. No wheezing.  Skin:    General: Skin is warm and dry.  Neurological:     Mental Status: She is alert and oriented to person, place, and time.  Psychiatric:        Mood and Affect: Mood normal.        Behavior: Behavior normal.      UC Treatments /  Results  Labs (all labs ordered are listed, but only abnormal results are displayed) Labs Reviewed - No data to display  EKG   Radiology No results found.  Procedures Procedures (including critical care time)  Medications Ordered in UC Medications - No data to display  Initial Impression / Assessment and Plan / UC Course  I have reviewed the triage vital signs and the nursing notes.  Pertinent labs & imaging results that were available during my care of the patient were reviewed by me and considered in my medical decision making (see chart for details).   Patient is afebrile here without recent antipyretics. Satting well on room air. Overall is ill appearing, well hydrated, without respiratory distress. Pulmonary exam is unremarkable.  Lungs CTAB without wheezing, rhonchi, rales.  TMs are WNL.  No pharyngeal erythema or peritonsillar exudates.  Symptoms are most consistent with an acute viral process.  She is outside the treatment window for antiviral therapy for flu or COVID and none is offered.  Given history of asthma and in the context of morbid obesity, somewhat concerned poor outcome and will prescribe a course of azithromycin along with prednisone.  Otherwise recommending continued use of OTC medication for symptom control as well as previously prescribed inhalers.  Final Clinical Impressions(s) / UC Diagnoses   Final diagnoses:  None   Discharge Instructions   None    ED Prescriptions   None    PDMP not reviewed this encounter.   Rose Phi, [place] 02/12/23 [phone]

## 2023-02-15 ENCOUNTER — Ambulatory Visit
Admission: RE | Admit: 2023-02-15 | Discharge: 2023-02-15 | Disposition: A | Discharge status: Home / Self Care | Payer: BC Managed Care – PPO | Source: Ambulatory Visit | Attending: Interventional Radiology | Admitting: Interventional Radiology

## 2023-02-15 DIAGNOSIS — T829XXA Unspecified complication of cardiac and vascular prosthetic device, implant and graft, initial encounter: Secondary | ICD-10-CM | POA: Insufficient documentation

## 2023-02-15 DIAGNOSIS — Y849 Medical procedure, unspecified as the cause of abnormal reaction of the patient, or of later complication, without mention of misadventure at the time of the procedure: Secondary | ICD-10-CM | POA: Diagnosis not present

## 2023-02-15 DIAGNOSIS — T82828A Fibrosis of vascular prosthetic devices, implants and grafts, initial encounter: Secondary | ICD-10-CM | POA: Diagnosis not present

## 2023-02-15 HISTORY — PX: IR CV LINE INJECTION: IMG2294

## 2023-02-15 MED ORDER — HEPARIN SOD (PORK) LOCK FLUSH 100 UNIT/ML IV SOLN
INTRAVENOUS | Status: AC
  Administered 2023-02-15: 500 [IU]
  Filled 2023-02-15: qty 5

## 2023-02-15 MED ORDER — IOHEXOL 300 MG/ML  SOLN
50.0000 mL | Freq: Once | INTRAMUSCULAR | Status: AC | PRN
  Administered 2023-02-15: 20 mL via INTRAVENOUS

## 2023-02-16 ENCOUNTER — Encounter: Payer: Self-pay | Admitting: Nurse Practitioner

## 2023-02-16 MED ORDER — ALBUTEROL SULFATE HFA 108 (90 BASE) MCG/ACT IN AERS: 1.0000 | INHALATION_SPRAY | Freq: Four times a day (QID) | RESPIRATORY_TRACT | 1 refills | Status: DC | PRN

## 2023-02-21 DIAGNOSIS — F4312 Post-traumatic stress disorder, chronic: Secondary | ICD-10-CM | POA: Diagnosis not present

## 2023-02-21 DIAGNOSIS — F329 Major depressive disorder, single episode, unspecified: Secondary | ICD-10-CM | POA: Diagnosis not present

## 2023-02-21 DIAGNOSIS — K50919 Crohn's disease, unspecified, with unspecified complications: Secondary | ICD-10-CM | POA: Diagnosis not present

## 2023-02-22 DIAGNOSIS — K50919 Crohn's disease, unspecified, with unspecified complications: Secondary | ICD-10-CM | POA: Diagnosis not present

## 2023-02-24 ENCOUNTER — Encounter: Payer: Self-pay | Admitting: Nurse Practitioner

## 2023-02-24 ENCOUNTER — Ambulatory Visit: Payer: BC Managed Care – PPO | Admitting: Nurse Practitioner

## 2023-02-24 VITALS — BP 138/76 | HR 82 | Temp 98.1°F | Resp 16 | Ht 66.0 in | Wt 391.4 lb

## 2023-02-24 DIAGNOSIS — R030 Elevated blood-pressure reading, without diagnosis of hypertension: Secondary | ICD-10-CM

## 2023-02-24 DIAGNOSIS — E538 Deficiency of other specified B group vitamins: Secondary | ICD-10-CM

## 2023-02-24 DIAGNOSIS — K5000 Crohn's disease of small intestine without complications: Secondary | ICD-10-CM

## 2023-02-24 DIAGNOSIS — G4700 Insomnia, unspecified: Secondary | ICD-10-CM

## 2023-02-24 DIAGNOSIS — F329 Major depressive disorder, single episode, unspecified: Secondary | ICD-10-CM | POA: Diagnosis not present

## 2023-02-24 DIAGNOSIS — L4050 Arthropathic psoriasis, unspecified: Secondary | ICD-10-CM

## 2023-02-24 DIAGNOSIS — J4530 Mild persistent asthma, uncomplicated: Secondary | ICD-10-CM | POA: Diagnosis not present

## 2023-02-24 DIAGNOSIS — R202 Paresthesia of skin: Secondary | ICD-10-CM | POA: Diagnosis not present

## 2023-02-24 DIAGNOSIS — G629 Polyneuropathy, unspecified: Secondary | ICD-10-CM | POA: Diagnosis not present

## 2023-02-24 DIAGNOSIS — E559 Vitamin D deficiency, unspecified: Secondary | ICD-10-CM

## 2023-02-24 DIAGNOSIS — R200 Anesthesia of skin: Secondary | ICD-10-CM | POA: Diagnosis not present

## 2023-02-24 DIAGNOSIS — F4312 Post-traumatic stress disorder, chronic: Secondary | ICD-10-CM | POA: Diagnosis not present

## 2023-02-24 LAB — CBC
HCT: 37.5 % (ref 36.0–46.0)
Hemoglobin: 12.4 g/dL (ref 12.0–15.0)
MCHC: 32.9 g/dL (ref 30.0–36.0)
MCV: 86.1 fl (ref 78.0–100.0)
Platelets: 393 10*3/uL (ref 150.0–400.0)
RBC: 4.36 Mil/uL (ref 3.87–5.11)
RDW: 14.7 % (ref 11.5–15.5)
WBC: 8.2 10*3/uL (ref 4.0–10.5)

## 2023-02-24 LAB — COMPREHENSIVE METABOLIC PANEL
ALT: 26 U/L (ref 0–35)
AST: 19 U/L (ref 0–37)
Albumin: 3.8 g/dL (ref 3.5–5.2)
Alkaline Phosphatase: 71 U/L (ref 39–117)
BUN: 14 mg/dL (ref 6–23)
CO2: 27 mEq/L (ref 19–32)
Calcium: 9.6 mg/dL (ref 8.4–10.5)
Chloride: 101 mEq/L (ref 96–112)
Creatinine, Ser: 0.74 mg/dL (ref 0.40–1.20)
GFR: 105.73 mL/min (ref >60.00)
Glucose, Bld: 80 mg/dL (ref 70–99)
Potassium: 4.1 mEq/L (ref 3.5–5.1)
Sodium: 138 mEq/L (ref 135–145)
Total Bilirubin: 0.4 mg/dL (ref 0.2–1.2)
Total Protein: 7 g/dL (ref 6.0–8.3)

## 2023-02-24 LAB — VITAMIN D 25 HYDROXY (VIT D DEFICIENCY, FRACTURES): VITD: 28.62 ng/mL — ABNORMAL LOW (ref 30.00–100.00)

## 2023-02-24 LAB — VITAMIN B12: Vitamin B-12: 332 pg/mL (ref 211–911)

## 2023-02-24 NOTE — Assessment & Plan Note (Signed)
Patient has had several elevated blood pressure readings from other providers.  Blood pressure was elevated here in office but upon recheck came back to normal.  All her visits with me in office have had normal blood pressures.  Do think this is a result of not rechecking blood pressure.  Did have conversation with patient we will defer antihypertensive medications at this juncture.

## 2023-02-24 NOTE — Assessment & Plan Note (Signed)
Patient is followed by Dr. Marius Ditch through [place].  Patient does have a port and gets Remicade infusions at home currently.  Continue following with GI

## 2023-02-24 NOTE — Assessment & Plan Note (Signed)
Ability to go to sleep and stay asleep.  She is working with her behavioral health provider with medication management

## 2023-02-24 NOTE — Assessment & Plan Note (Signed)
Patient currently followed by allergy and asthma specialist.  On albuterol Asmanex and Singulair.  States she uses albuterol inhaler very infrequently except when she is sick.

## 2023-02-24 NOTE — Progress Notes (Signed)
Established Patient Office Visit  Subjective   Patient ID: Kerry Martinez, female    DOB: Nov 09, 1986  Age: 37 y.o. MRN: [id number]  Chief Complaint  Patient presents with   Medical Management of Chronic Issues    6 month follow up    HPI   Asthma: patient is followed by allergy and asthma specialist. She is on albuterol, asmanex, singular. States that she did not have to ouse th ealbuterol until she gets sick. States that it is daily use currently   Crohns: followed by Dr Marius Ditch GI. SHe is on remicard and methotrexate. She is doing home infusions and has a port  Psoriatic arthritis: Methotrexate followed by [facility]  History of gastric surgery: Patient does have a history of gastric surgery that resulted in vitamin deficiencies such as B12 and vitamin D.  Patient is currently on vitamin D 50,000 IUs weekly   GAD/ States that seen once a month. States that they recently upped her prazosin to '9mg'$  total. States that she is having trouble sleeping. States that she can go to sleep but not staying asleep.     Review of Systems  Constitutional:  Negative for chills and fever.  Respiratory:  Negative for shortness of breath.   Cardiovascular:  Negative for chest pain.  Gastrointestinal:  Negative for abdominal pain, constipation, diarrhea, nausea and vomiting.       Bm daily   Genitourinary:  Negative for dysuria and hematuria.  Neurological:  Negative for headaches.  Psychiatric/Behavioral:  Negative for hallucinations and suicidal ideas. The patient has insomnia.       Objective:     BP 138/76   Pulse 82   Temp 98.1 F (36.7 C)   Resp 16   Ht '5\' 6"'$  (1.676 m)   Wt (!) 391 lb 6 oz (177.5 kg)   SpO2 99%   BMI 63.17 kg/m  BP Readings from Last 3 Encounters:  02/24/23 138/76  02/12/23 (!) 150/95  12/02/22 (!) 152/96   Wt Readings from Last 3 Encounters:  02/24/23 (!) 391 lb 6 oz (177.5 kg)  12/02/22 (!) 380 lb (172.4 kg)  11/23/22 (!) 391 lb (177.4 kg)       Physical Exam Vitals and nursing note reviewed.  Constitutional:      Appearance: Normal appearance.  Cardiovascular:     Rate and Rhythm: Normal rate and regular rhythm.     Heart sounds: Normal heart sounds.  Pulmonary:     Effort: Pulmonary effort is normal.     Breath sounds: Normal breath sounds.  Abdominal:     General: Bowel sounds are normal.  Musculoskeletal:     Right lower leg: No edema.     Left lower leg: No edema.  Neurological:     Mental Status: She is alert.      No results found for any visits on 02/24/23.    The ASCVD Risk score (Arnett DK, et al., 2019) failed to calculate for the following reasons:   The 2019 ASCVD risk score is only valid for ages 6 to 15    Assessment & Plan:   Problem List Items Addressed This Visit       Respiratory   Asthma - Primary    Patient currently followed by allergy and asthma specialist.  On albuterol Asmanex and Singulair.  States she uses albuterol inhaler very infrequently except when she is sick.        Digestive   Crohn's disease, small intestine ([place]) (Chronic)  Patient is followed by Dr. Marius Ditch through [facility].  Patient does have a port and gets Remicade infusions at home currently.  Continue following with GI      Relevant Orders   CBC   Comprehensive metabolic panel     Musculoskeletal and Integument   Psoriatic arthritis [facility])    Patient is followed through rheumatology at the [facility] clinic.  She is currently on methotrexate.  Continue following with [facility] clinic and taking medications as prescribed        Other   Vitamin B12 deficiency    Status post gastric surgery.  Patient has deficiency in the past pending levels today      Relevant Orders   Vitamin B12   Vitamin D deficiency    Status post gastric surgery.  Patient is on replacement and vitamin D levels are low normal.  Recheck today      Relevant Orders   VITAMIN D 25 Hydroxy (Vit-D Deficiency, Fractures)   Morbid  obesity (HCC)   Insomnia    Ability to go to sleep and stay asleep.  She is working with her behavioral health provider with medication management      Elevated blood pressure reading    Patient has had several elevated blood pressure readings from other providers.  Blood pressure was elevated here in office but upon recheck came back to normal.  All her visits with me in office have had normal blood pressures.  Do think this is a result of not rechecking blood pressure.  Did have conversation with patient we will defer antihypertensive medications at this juncture.       Return in about 6 months (around 08/27/2023) for CPE and Labs.    Romilda Garret, NP

## 2023-02-24 NOTE — Assessment & Plan Note (Signed)
Status post gastric surgery.  Patient is on replacement and vitamin D levels are low normal.  Recheck today

## 2023-02-24 NOTE — Assessment & Plan Note (Signed)
Status post gastric surgery.  Patient has deficiency in the past pending levels today

## 2023-02-24 NOTE — Assessment & Plan Note (Signed)
Patient is followed through rheumatology at the [facility].  She is currently on methotrexate.  Continue following with [facility] clinic and taking medications as prescribed

## 2023-02-24 NOTE — Patient Instructions (Addendum)
Nice to see you today I will be in touch with the labs once I have them Follow up with me in 6 months for your physical and labs

## 2023-02-25 DIAGNOSIS — K50919 Crohn's disease, unspecified, with unspecified complications: Secondary | ICD-10-CM | POA: Diagnosis not present

## 2023-03-11 DIAGNOSIS — F4312 Post-traumatic stress disorder, chronic: Secondary | ICD-10-CM | POA: Diagnosis not present

## 2023-03-11 DIAGNOSIS — F329 Major depressive disorder, single episode, unspecified: Secondary | ICD-10-CM | POA: Diagnosis not present

## 2023-03-17 DIAGNOSIS — H1045 Other chronic allergic conjunctivitis: Secondary | ICD-10-CM | POA: Diagnosis not present

## 2023-03-17 DIAGNOSIS — J4530 Mild persistent asthma, uncomplicated: Secondary | ICD-10-CM | POA: Diagnosis not present

## 2023-03-17 DIAGNOSIS — J3089 Other allergic rhinitis: Secondary | ICD-10-CM | POA: Diagnosis not present

## 2023-03-17 DIAGNOSIS — L501 Idiopathic urticaria: Secondary | ICD-10-CM | POA: Diagnosis not present

## 2023-03-21 DIAGNOSIS — F329 Major depressive disorder, single episode, unspecified: Secondary | ICD-10-CM | POA: Diagnosis not present

## 2023-03-21 DIAGNOSIS — F4312 Post-traumatic stress disorder, chronic: Secondary | ICD-10-CM | POA: Diagnosis not present

## 2023-04-01 DIAGNOSIS — F329 Major depressive disorder, single episode, unspecified: Secondary | ICD-10-CM | POA: Diagnosis not present

## 2023-04-01 DIAGNOSIS — F4312 Post-traumatic stress disorder, chronic: Secondary | ICD-10-CM | POA: Diagnosis not present

## 2023-04-14 ENCOUNTER — Other Ambulatory Visit: Payer: Self-pay | Admitting: Nurse Practitioner

## 2023-04-14 DIAGNOSIS — L4050 Arthropathic psoriasis, unspecified: Secondary | ICD-10-CM | POA: Diagnosis not present

## 2023-04-14 DIAGNOSIS — L409 Psoriasis, unspecified: Secondary | ICD-10-CM | POA: Diagnosis not present

## 2023-04-14 DIAGNOSIS — M797 Fibromyalgia: Secondary | ICD-10-CM | POA: Diagnosis not present

## 2023-04-14 DIAGNOSIS — K50919 Crohn's disease, unspecified, with unspecified complications: Secondary | ICD-10-CM | POA: Diagnosis not present

## 2023-04-15 DIAGNOSIS — F4312 Post-traumatic stress disorder, chronic: Secondary | ICD-10-CM | POA: Diagnosis not present

## 2023-04-15 DIAGNOSIS — F329 Major depressive disorder, single episode, unspecified: Secondary | ICD-10-CM | POA: Diagnosis not present

## 2023-04-19 DIAGNOSIS — K50919 Crohn's disease, unspecified, with unspecified complications: Secondary | ICD-10-CM | POA: Diagnosis not present

## 2023-04-20 DIAGNOSIS — K50919 Crohn's disease, unspecified, with unspecified complications: Secondary | ICD-10-CM | POA: Diagnosis not present

## 2023-04-21 DIAGNOSIS — Z1331 Encounter for screening for depression: Secondary | ICD-10-CM | POA: Diagnosis not present

## 2023-04-21 DIAGNOSIS — F331 Major depressive disorder, recurrent, moderate: Secondary | ICD-10-CM | POA: Diagnosis not present

## 2023-04-21 DIAGNOSIS — F4312 Post-traumatic stress disorder, chronic: Secondary | ICD-10-CM | POA: Diagnosis not present

## 2023-04-27 DIAGNOSIS — F4312 Post-traumatic stress disorder, chronic: Secondary | ICD-10-CM | POA: Diagnosis not present

## 2023-04-27 DIAGNOSIS — F329 Major depressive disorder, single episode, unspecified: Secondary | ICD-10-CM | POA: Diagnosis not present

## 2023-05-24 ENCOUNTER — Encounter: Payer: Self-pay | Admitting: Nurse Practitioner

## 2023-05-24 ENCOUNTER — Ambulatory Visit: Payer: BC Managed Care – PPO | Admitting: Nurse Practitioner

## 2023-05-24 VITALS — BP 150/98 | HR 100 | Temp 97.7°F | Resp 16 | Ht 66.0 in | Wt 382.5 lb

## 2023-05-24 DIAGNOSIS — Z6841 Body Mass Index (BMI) 40.0 and over, adult: Secondary | ICD-10-CM

## 2023-05-24 DIAGNOSIS — I10 Essential (primary) hypertension: Secondary | ICD-10-CM | POA: Diagnosis not present

## 2023-05-24 DIAGNOSIS — R7303 Prediabetes: Secondary | ICD-10-CM | POA: Diagnosis not present

## 2023-05-24 DIAGNOSIS — F331 Major depressive disorder, recurrent, moderate: Secondary | ICD-10-CM | POA: Diagnosis not present

## 2023-05-24 DIAGNOSIS — F4312 Post-traumatic stress disorder, chronic: Secondary | ICD-10-CM | POA: Diagnosis not present

## 2023-05-24 MED ORDER — AMLODIPINE BESYLATE 2.5 MG PO TABS
2.5000 mg | ORAL_TABLET | Freq: Every day | ORAL | 0 refills | Status: DC
Start: 2023-05-24 — End: 2023-06-21

## 2023-05-24 MED ORDER — WEGOVY 0.25 MG/0.5ML ~~LOC~~ SOAJ
0.2500 mg | SUBCUTANEOUS | 0 refills | Status: DC
Start: 2023-05-24 — End: 2023-07-04

## 2023-05-24 NOTE — Assessment & Plan Note (Addendum)
Patient spoke to her other specialties and would like to try weight loss medication.  Patient has been exercising and working on diet.  She has lost approximately 10 pounds since seeing me in office last time.  She does try to exercise but is limited from an orthopedic standpoint and psoriatic arthritis.  Which she is seeing a specialist for.  Patient also status post gastric surgery

## 2023-05-24 NOTE — Patient Instructions (Signed)
Nice to see you today I have sent in 2 medications to the pharmacy  Follow up with me in 1 month  Check your blood pressure at home twice a week and send me the readings

## 2023-05-24 NOTE — Assessment & Plan Note (Signed)
History of elevated blood pressures in the past.  Elevated today x 2 has been elevated at home will like to treat patient with amlodipine 2.5 mg daily.  Follow-up 1 month for recheck.  May have to consider going up to 5 mg pending blood pressure

## 2023-05-24 NOTE — Progress Notes (Signed)
Acute Office Visit  Subjective:     Patient ID: Kerry Martinez, female    DOB: 1986/10/22, 37 y.o.   MRN: [id number]  Chief Complaint  Patient presents with   Hypertension   Med Change Request     Patient is in today for blood pressure concerns and medication consult   HTN: states taht she was seen at her pysch and it was high and they were concerned for her BP. State that she is checking it at home once a week. States that she is getting it around 156/102.  She has had elevated blood pressures in the past in our office setting.  Also had elevated blood pressures at her specialty providers.  Patient currently not having any symptoms elevated blood pressure  Weight loss: states that she has talked to Endocrine and BH and recommend weight loss medication. States that on a good day she will do three meals and will snack sometimes. States that she will do a caffiene in the am if that is coffee or Dr. Reino Kent then the rest of the day is water States that she will exercise. States that on a good week [facility] [place] [facility] [place] [facility] She will do the treadmill and bike for 45 mins. 3 times a week on a good week.   She has done jazzercise in the past and Weight  loss surgery   Review of Systems  Constitutional:  Negative for chills and fever.  Respiratory:  Negative for shortness of breath.   Cardiovascular:  Negative for chest pain.  Neurological:  Negative for headaches.        Objective:    BP (!) 150/98   Pulse 100   Temp 97.7 F (36.5 C)   Resp 16   Ht 5\' 6"  (1.676 m)   Wt (!) 382 lb 8 oz (173.5 kg)   SpO2 98%   BMI 61.74 kg/m  BP Readings from Last 3 Encounters:  05/24/23 (!) 150/98  02/24/23 138/76  02/12/23 (!) 150/95   Wt Readings from Last 3 Encounters:  05/24/23 (!) 382 lb 8 oz (173.5 kg)  02/24/23 (!) 391 lb 6 oz (177.5 kg)  12/02/22 (!) 380 lb (172.4 kg)      Physical Exam Vitals and nursing note reviewed.  Constitutional:      Appearance: Normal  appearance.  Cardiovascular:     Rate and Rhythm: Normal rate and regular rhythm.     Heart sounds: Normal heart sounds.  Pulmonary:     Effort: Pulmonary effort is normal.     Breath sounds: Normal breath sounds.  Abdominal:     General: Bowel sounds are normal.  Neurological:     Mental Status: She is alert.     No results found for any visits on 05/24/23.      Assessment & Plan:   Problem List Items Addressed This Visit       Cardiovascular and Mediastinum   Primary hypertension - Primary    History of elevated blood pressures in the past.  Elevated today x 2 has been elevated at home will like to treat patient with amlodipine 2.5 mg daily.  Follow-up 1 month for recheck.  May have to consider going up to 5 mg pending blood pressure      Relevant Medications   amLODipine (NORVASC) 2.5 MG tablet   Semaglutide-Weight Management (WEGOVY) 0.25 MG/0.5ML SOAJ     Other   Morbid obesity (HCC)    Patient spoke to her  other specialties and would like to try weight loss medication.  Patient has been exercising and working on diet.  She has lost approximately 10 pounds since seeing me in office last time.  She does try to exercise but is limited from an orthopedic standpoint and psoriatic arthritis.  Which she is seeing a specialist for.  Patient also status post gastric surgery      Relevant Medications   Semaglutide-Weight Management (WEGOVY) 0.25 MG/0.5ML SOAJ   Prediabetes   Relevant Medications   Semaglutide-Weight Management (WEGOVY) 0.25 MG/0.5ML SOAJ    Meds ordered this encounter  Medications   amLODipine (NORVASC) 2.5 MG tablet    Sig: Take 1 tablet (2.5 mg total) by mouth daily.    Dispense:  90 tablet    Refill:  0    Order Specific Question:   Supervising Provider    Answer:   Milinda Antis, MARNE A [[id number]]   Semaglutide-Weight Management (WEGOVY) 0.25 MG/0.5ML SOAJ    Sig: Inject 0.25 mg into the skin once a week.    Dispense:  2 mL    Refill:  0    Order Specific  Question:   Supervising Provider    Answer:   Roxy Manns A [[id number]]    Return in about 4 weeks (around 06/21/2023) for BP recheck.  Audria Nine, NP

## 2023-05-25 DIAGNOSIS — F4312 Post-traumatic stress disorder, chronic: Secondary | ICD-10-CM | POA: Diagnosis not present

## 2023-05-25 DIAGNOSIS — F329 Major depressive disorder, single episode, unspecified: Secondary | ICD-10-CM | POA: Diagnosis not present

## 2023-06-04 ENCOUNTER — Telehealth: Payer: Self-pay

## 2023-06-04 ENCOUNTER — Other Ambulatory Visit (HOSPITAL_COMMUNITY): Payer: Self-pay

## 2023-06-04 NOTE — Telephone Encounter (Signed)
Patient Advocate Encounter   Received notification from [employer] that prior authorization is required for [facility] 0.25MG /0.5ML auto-injectors   Submitted: 06-04-2023 Key [id number]  Status is pending

## 2023-06-07 DIAGNOSIS — Z1159 Encounter for screening for other viral diseases: Secondary | ICD-10-CM | POA: Diagnosis not present

## 2023-06-07 DIAGNOSIS — Z113 Encounter for screening for infections with a predominantly sexual mode of transmission: Secondary | ICD-10-CM | POA: Diagnosis not present

## 2023-06-07 DIAGNOSIS — L292 Pruritus vulvae: Secondary | ICD-10-CM | POA: Diagnosis not present

## 2023-06-07 DIAGNOSIS — R300 Dysuria: Secondary | ICD-10-CM | POA: Diagnosis not present

## 2023-06-07 DIAGNOSIS — R350 Frequency of micturition: Secondary | ICD-10-CM | POA: Diagnosis not present

## 2023-06-07 DIAGNOSIS — B3731 Acute candidiasis of vulva and vagina: Secondary | ICD-10-CM | POA: Diagnosis not present

## 2023-06-10 DIAGNOSIS — F4312 Post-traumatic stress disorder, chronic: Secondary | ICD-10-CM | POA: Diagnosis not present

## 2023-06-10 DIAGNOSIS — F329 Major depressive disorder, single episode, unspecified: Secondary | ICD-10-CM | POA: Diagnosis not present

## 2023-06-11 NOTE — Telephone Encounter (Signed)
Patient Advocate Encounter  Prior Authorization for [employer] 0.25MG /0.5ML auto-injectors has been approved through [employer].    Kerry Martinez **  Effective: 06-11-2023 to 01-07-2024  **Please note that this is a resubmission.

## 2023-06-13 ENCOUNTER — Encounter: Payer: Self-pay | Admitting: Nurse Practitioner

## 2023-06-13 MED ORDER — CYANOCOBALAMIN 1000 MCG/ML IJ SOLN: 1000.0000 ug | INTRAMUSCULAR | 6 refills | Status: DC

## 2023-06-13 NOTE — Telephone Encounter (Signed)
Pt has been informed of this information.

## 2023-06-16 DIAGNOSIS — K50919 Crohn's disease, unspecified, with unspecified complications: Secondary | ICD-10-CM | POA: Diagnosis not present

## 2023-06-21 ENCOUNTER — Ambulatory Visit: Payer: BC Managed Care – PPO | Admitting: Nurse Practitioner

## 2023-06-21 ENCOUNTER — Encounter: Payer: Self-pay | Admitting: Nurse Practitioner

## 2023-06-21 VITALS — BP 136/88 | HR 88 | Temp 98.3°F | Resp 16 | Ht 66.0 in | Wt 384.4 lb

## 2023-06-21 DIAGNOSIS — I10 Essential (primary) hypertension: Secondary | ICD-10-CM | POA: Diagnosis not present

## 2023-06-21 DIAGNOSIS — Z6841 Body Mass Index (BMI) 40.0 and over, adult: Secondary | ICD-10-CM

## 2023-06-21 DIAGNOSIS — F4312 Post-traumatic stress disorder, chronic: Secondary | ICD-10-CM | POA: Diagnosis not present

## 2023-06-21 DIAGNOSIS — F331 Major depressive disorder, recurrent, moderate: Secondary | ICD-10-CM | POA: Diagnosis not present

## 2023-06-21 MED ORDER — AMLODIPINE BESYLATE 5 MG PO TABS
5.0000 mg | ORAL_TABLET | Freq: Every day | ORAL | 0 refills | Status: DC
Start: 2023-06-21 — End: 2023-09-19

## 2023-06-21 NOTE — Assessment & Plan Note (Signed)
Patient currently maintained on amlodipine 2.5 mg daily.  She still having elevated blood pressures outside of office and borderline in office.  Will titrate up to amlodipine 5 mg daily.  Continue checking blood pressure 2-3 times a week at home

## 2023-06-21 NOTE — Assessment & Plan Note (Signed)
Patient currently maintained on semaglutide 0.25 mg once a week.  Recently started the medication she will reach out to me when she has 1 week left and we will titrate up to 0.5 mg once a week.  Continue working healthy lifestyle modifications

## 2023-06-21 NOTE — Patient Instructions (Signed)
Nice to see you today I am going to increase your amlodipine form 2.5 to 5mg  daily. You can take 2 of the 2.5mg  tablets until you finish your supply When you are getting to use your last injection reach out and I will send in the next dose

## 2023-06-21 NOTE — Progress Notes (Signed)
   Established Patient Office Visit  Subjective   Patient ID: Kerry Martinez, female    DOB: 1986/03/30  Age: 37 y.o. MRN: [id number]  Chief Complaint  Patient presents with   Hypertension      HTN: Patient was seen by me on 05/24/2023 with concern of elevated blood pressure outside of the office setting.  Patient was placed on amlodipine 2.5 mg and instructed to continue to check blood pressure at home. States that she is checking it 2-3 times a week at home. She is tolerating the medication well   Obesity: Patient was started on semaglutide 0.25 mg.  PA was done and was approved on 06/04/2023.  Patient is exercising at home walking daily approximately 1 mile. 1 dr. Reino Kent and water the rest of the day.     Review of Systems  Constitutional:  Negative for chills and fever.  Respiratory:  Negative for shortness of breath.   Cardiovascular:  Negative for chest pain and leg swelling.  Neurological:  Negative for dizziness and headaches.      Objective:     BP 136/88   Pulse 88   Temp 98.3 F (36.8 C)   Resp 16   Ht 5\' 6"  (1.676 m)   Wt (!) 384 lb 6 oz (174.4 kg)   SpO2 99%   BMI 62.04 kg/m  BP Readings from Last 3 Encounters:  06/21/23 136/88  05/24/23 (!) 150/98  02/24/23 138/76   Wt Readings from Last 3 Encounters:  06/21/23 (!) 384 lb 6 oz (174.4 kg)  05/24/23 (!) 382 lb 8 oz (173.5 kg)  02/24/23 (!) 391 lb 6 oz (177.5 kg)      Physical Exam Vitals and nursing note reviewed.  Constitutional:      Appearance: Normal appearance.  Cardiovascular:     Rate and Rhythm: Normal rate and regular rhythm.     Heart sounds: Normal heart sounds.  Pulmonary:     Effort: Pulmonary effort is normal.     Breath sounds: Normal breath sounds.  Neurological:     Mental Status: She is alert.      No results found for any visits on 06/21/23.    The ASCVD Risk score (Arnett DK, et al., 2019) failed to calculate for the following reasons:   The 2019 ASCVD risk score  is only valid for ages 30 to 85    Assessment & Plan:   Problem List Items Addressed This Visit       Cardiovascular and Mediastinum   Primary hypertension - Primary    Patient currently maintained on amlodipine 2.5 mg daily.  She still having elevated blood pressures outside of office and borderline in office.  Will titrate up to amlodipine 5 mg daily.  Continue checking blood pressure 2-3 times a week at home      Relevant Medications   amLODipine (NORVASC) 5 MG tablet     Other   Morbid obesity (HCC)    Patient currently maintained on semaglutide 0.25 mg once a week.  Recently started the medication she will reach out to me when she has 1 week left and we will titrate up to 0.5 mg once a week.  Continue working healthy lifestyle modifications       Return in about 3 months (around 09/21/2023) for HTN/wegovy recheck .    Audria Nine, NP

## 2023-06-22 ENCOUNTER — Other Ambulatory Visit: Payer: Self-pay

## 2023-06-27 ENCOUNTER — Telehealth: Payer: BC Managed Care – PPO | Admitting: Physician Assistant

## 2023-06-27 DIAGNOSIS — B3731 Acute candidiasis of vulva and vagina: Secondary | ICD-10-CM

## 2023-06-27 MED ORDER — FLUCONAZOLE 150 MG PO TABS
150.0000 mg | ORAL_TABLET | ORAL | 0 refills | Status: DC | PRN
Start: 2023-06-27 — End: 2023-08-14

## 2023-06-27 NOTE — Progress Notes (Signed)

## 2023-06-29 DIAGNOSIS — F329 Major depressive disorder, single episode, unspecified: Secondary | ICD-10-CM | POA: Diagnosis not present

## 2023-06-29 DIAGNOSIS — F4312 Post-traumatic stress disorder, chronic: Secondary | ICD-10-CM | POA: Diagnosis not present

## 2023-07-03 ENCOUNTER — Encounter: Payer: Self-pay | Admitting: Nurse Practitioner

## 2023-07-03 DIAGNOSIS — R7303 Prediabetes: Secondary | ICD-10-CM

## 2023-07-03 DIAGNOSIS — I10 Essential (primary) hypertension: Secondary | ICD-10-CM

## 2023-07-04 MED ORDER — WEGOVY 0.5 MG/0.5ML ~~LOC~~ SOAJ
0.5000 mg | SUBCUTANEOUS | 0 refills | Status: DC
Start: 2023-07-04 — End: 2023-08-03

## 2023-07-04 NOTE — Telephone Encounter (Signed)
LAST APPOINTMENT DATE: 06/21/2023   NEXT APPOINTMENT DATE: none    LAST REFILL: WEGOVY 05/24/2023  QTY: 2mL 0RF

## 2023-07-07 ENCOUNTER — Telehealth: Payer: Self-pay | Admitting: Gastroenterology

## 2023-07-07 NOTE — Telephone Encounter (Signed)
Patient was worried about being in network. Talked to the nurse the patient would have to call her insurance company because we don't see anything on our end, her insurance company is the only one who can verify. I transfer the call to our nurse to speak to them.

## 2023-07-14 DIAGNOSIS — Z1151 Encounter for screening for human papillomavirus (HPV): Secondary | ICD-10-CM | POA: Diagnosis not present

## 2023-07-14 DIAGNOSIS — Z01419 Encounter for gynecological examination (general) (routine) without abnormal findings: Secondary | ICD-10-CM | POA: Diagnosis not present

## 2023-07-14 DIAGNOSIS — Z6841 Body Mass Index (BMI) 40.0 and over, adult: Secondary | ICD-10-CM | POA: Diagnosis not present

## 2023-07-14 DIAGNOSIS — F3289 Other specified depressive episodes: Secondary | ICD-10-CM | POA: Diagnosis not present

## 2023-07-14 DIAGNOSIS — Z124 Encounter for screening for malignant neoplasm of cervix: Secondary | ICD-10-CM | POA: Diagnosis not present

## 2023-07-17 ENCOUNTER — Other Ambulatory Visit: Payer: Self-pay | Admitting: Nurse Practitioner

## 2023-07-17 DIAGNOSIS — E559 Vitamin D deficiency, unspecified: Secondary | ICD-10-CM

## 2023-07-18 NOTE — Telephone Encounter (Signed)
Patient has follow up on 9/3. Did not see how long you wanted her to take the 50,000. Does she need refill or change over to over the counter?

## 2023-07-20 ENCOUNTER — Other Ambulatory Visit: Payer: Self-pay | Admitting: Nurse Practitioner

## 2023-07-25 DIAGNOSIS — R202 Paresthesia of skin: Secondary | ICD-10-CM | POA: Diagnosis not present

## 2023-07-25 DIAGNOSIS — G629 Polyneuropathy, unspecified: Secondary | ICD-10-CM | POA: Diagnosis not present

## 2023-07-25 DIAGNOSIS — F331 Major depressive disorder, recurrent, moderate: Secondary | ICD-10-CM | POA: Diagnosis not present

## 2023-07-25 DIAGNOSIS — F4312 Post-traumatic stress disorder, chronic: Secondary | ICD-10-CM | POA: Diagnosis not present

## 2023-07-25 DIAGNOSIS — Z1331 Encounter for screening for depression: Secondary | ICD-10-CM | POA: Diagnosis not present

## 2023-07-25 DIAGNOSIS — R200 Anesthesia of skin: Secondary | ICD-10-CM | POA: Diagnosis not present

## 2023-07-27 DIAGNOSIS — F329 Major depressive disorder, single episode, unspecified: Secondary | ICD-10-CM | POA: Diagnosis not present

## 2023-07-27 DIAGNOSIS — F4312 Post-traumatic stress disorder, chronic: Secondary | ICD-10-CM | POA: Diagnosis not present

## 2023-08-03 MED ORDER — WEGOVY 1 MG/0.5ML ~~LOC~~ SOAJ
1.0000 mg | SUBCUTANEOUS | 0 refills | Status: DC
Start: 2023-08-03 — End: 2023-08-30

## 2023-08-03 NOTE — Addendum Note (Signed)
Addended by: Eden Emms on: 08/03/2023 11:51 AM   Modules accepted: Orders

## 2023-08-09 DIAGNOSIS — F4312 Post-traumatic stress disorder, chronic: Secondary | ICD-10-CM | POA: Diagnosis not present

## 2023-08-09 DIAGNOSIS — F329 Major depressive disorder, single episode, unspecified: Secondary | ICD-10-CM | POA: Diagnosis not present

## 2023-08-11 ENCOUNTER — Encounter (INDEPENDENT_AMBULATORY_CARE_PROVIDER_SITE_OTHER): Payer: Self-pay

## 2023-08-13 ENCOUNTER — Telehealth: Payer: BC Managed Care – PPO | Admitting: Nurse Practitioner

## 2023-08-13 DIAGNOSIS — B3731 Acute candidiasis of vulva and vagina: Secondary | ICD-10-CM | POA: Diagnosis not present

## 2023-08-14 MED ORDER — FLUCONAZOLE 150 MG PO TABS
150.0000 mg | ORAL_TABLET | ORAL | 0 refills | Status: DC | PRN
Start: 2023-08-14 — End: 2023-08-30

## 2023-08-14 NOTE — Progress Notes (Signed)
I have spent 5 minutes in review of e-visit questionnaire, review and updating patient chart, medical decision making and response to patient.  ° °Zelda W Fleming, NP ° °  °

## 2023-08-14 NOTE — Progress Notes (Signed)

## 2023-08-15 DIAGNOSIS — K50919 Crohn's disease, unspecified, with unspecified complications: Secondary | ICD-10-CM | POA: Diagnosis not present

## 2023-08-18 DIAGNOSIS — L4050 Arthropathic psoriasis, unspecified: Secondary | ICD-10-CM | POA: Diagnosis not present

## 2023-08-18 DIAGNOSIS — Z7960 Long term (current) use of unspecified immunomodulators and immunosuppressants: Secondary | ICD-10-CM | POA: Diagnosis not present

## 2023-08-18 DIAGNOSIS — L409 Psoriasis, unspecified: Secondary | ICD-10-CM | POA: Diagnosis not present

## 2023-08-18 DIAGNOSIS — M797 Fibromyalgia: Secondary | ICD-10-CM | POA: Diagnosis not present

## 2023-08-18 DIAGNOSIS — K50019 Crohn's disease of small intestine with unspecified complications: Secondary | ICD-10-CM | POA: Diagnosis not present

## 2023-08-22 DIAGNOSIS — F331 Major depressive disorder, recurrent, moderate: Secondary | ICD-10-CM | POA: Diagnosis not present

## 2023-08-22 DIAGNOSIS — F4312 Post-traumatic stress disorder, chronic: Secondary | ICD-10-CM | POA: Diagnosis not present

## 2023-08-23 DIAGNOSIS — M9905 Segmental and somatic dysfunction of pelvic region: Secondary | ICD-10-CM | POA: Diagnosis not present

## 2023-08-23 DIAGNOSIS — F329 Major depressive disorder, single episode, unspecified: Secondary | ICD-10-CM | POA: Diagnosis not present

## 2023-08-23 DIAGNOSIS — M545 Low back pain, unspecified: Secondary | ICD-10-CM | POA: Diagnosis not present

## 2023-08-23 DIAGNOSIS — F4312 Post-traumatic stress disorder, chronic: Secondary | ICD-10-CM | POA: Diagnosis not present

## 2023-08-23 DIAGNOSIS — M9904 Segmental and somatic dysfunction of sacral region: Secondary | ICD-10-CM | POA: Diagnosis not present

## 2023-08-23 DIAGNOSIS — M9903 Segmental and somatic dysfunction of lumbar region: Secondary | ICD-10-CM | POA: Diagnosis not present

## 2023-08-25 DIAGNOSIS — M9904 Segmental and somatic dysfunction of sacral region: Secondary | ICD-10-CM | POA: Diagnosis not present

## 2023-08-25 DIAGNOSIS — M9905 Segmental and somatic dysfunction of pelvic region: Secondary | ICD-10-CM | POA: Diagnosis not present

## 2023-08-25 DIAGNOSIS — M545 Low back pain, unspecified: Secondary | ICD-10-CM | POA: Diagnosis not present

## 2023-08-25 DIAGNOSIS — M9903 Segmental and somatic dysfunction of lumbar region: Secondary | ICD-10-CM | POA: Diagnosis not present

## 2023-08-30 ENCOUNTER — Ambulatory Visit: Payer: BC Managed Care – PPO | Admitting: Nurse Practitioner

## 2023-08-30 ENCOUNTER — Encounter: Payer: Self-pay | Admitting: Nurse Practitioner

## 2023-08-30 VITALS — BP 118/80 | HR 86 | Temp 98.2°F | Ht 66.0 in | Wt 373.0 lb

## 2023-08-30 DIAGNOSIS — J4520 Mild intermittent asthma, uncomplicated: Secondary | ICD-10-CM

## 2023-08-30 DIAGNOSIS — Z1159 Encounter for screening for other viral diseases: Secondary | ICD-10-CM | POA: Diagnosis not present

## 2023-08-30 DIAGNOSIS — E559 Vitamin D deficiency, unspecified: Secondary | ICD-10-CM

## 2023-08-30 DIAGNOSIS — Z114 Encounter for screening for human immunodeficiency virus [HIV]: Secondary | ICD-10-CM

## 2023-08-30 DIAGNOSIS — Z803 Family history of malignant neoplasm of breast: Secondary | ICD-10-CM

## 2023-08-30 DIAGNOSIS — M797 Fibromyalgia: Secondary | ICD-10-CM

## 2023-08-30 DIAGNOSIS — M9903 Segmental and somatic dysfunction of lumbar region: Secondary | ICD-10-CM | POA: Diagnosis not present

## 2023-08-30 DIAGNOSIS — E538 Deficiency of other specified B group vitamins: Secondary | ICD-10-CM | POA: Diagnosis not present

## 2023-08-30 DIAGNOSIS — M9905 Segmental and somatic dysfunction of pelvic region: Secondary | ICD-10-CM | POA: Diagnosis not present

## 2023-08-30 DIAGNOSIS — E039 Hypothyroidism, unspecified: Secondary | ICD-10-CM

## 2023-08-30 DIAGNOSIS — K50019 Crohn's disease of small intestine with unspecified complications: Secondary | ICD-10-CM

## 2023-08-30 DIAGNOSIS — M9904 Segmental and somatic dysfunction of sacral region: Secondary | ICD-10-CM | POA: Diagnosis not present

## 2023-08-30 DIAGNOSIS — R7303 Prediabetes: Secondary | ICD-10-CM | POA: Diagnosis not present

## 2023-08-30 DIAGNOSIS — I10 Essential (primary) hypertension: Secondary | ICD-10-CM | POA: Diagnosis not present

## 2023-08-30 DIAGNOSIS — L4050 Arthropathic psoriasis, unspecified: Secondary | ICD-10-CM

## 2023-08-30 DIAGNOSIS — G629 Polyneuropathy, unspecified: Secondary | ICD-10-CM

## 2023-08-30 DIAGNOSIS — Z0000 Encounter for general adult medical examination without abnormal findings: Secondary | ICD-10-CM

## 2023-08-30 DIAGNOSIS — M545 Low back pain, unspecified: Secondary | ICD-10-CM | POA: Diagnosis not present

## 2023-08-30 LAB — COMPREHENSIVE METABOLIC PANEL
ALT: 32 U/L (ref 0–35)
AST: 19 U/L (ref 0–37)
Albumin: 3.9 g/dL (ref 3.5–5.2)
Alkaline Phosphatase: 75 U/L (ref 39–117)
BUN: 13 mg/dL (ref 6–23)
CO2: 28 meq/L (ref 19–32)
Calcium: 9.1 mg/dL (ref 8.4–10.5)
Chloride: 103 meq/L (ref 96–112)
Creatinine, Ser: 0.86 mg/dL (ref 0.40–1.20)
GFR: 87.97 mL/min (ref >60.00)
Glucose, Bld: 80 mg/dL (ref 70–99)
Potassium: 4.3 meq/L (ref 3.5–5.1)
Sodium: 139 meq/L (ref 135–145)
Total Bilirubin: 0.4 mg/dL (ref 0.2–1.2)
Total Protein: 6.9 g/dL (ref 6.0–8.3)

## 2023-08-30 LAB — CBC
HCT: 38.3 % (ref 36.0–46.0)
Hemoglobin: 12.3 g/dL (ref 12.0–15.0)
MCHC: 32.3 g/dL (ref 30.0–36.0)
MCV: 90.2 fl (ref 78.0–100.0)
Platelets: 414 10*3/uL — ABNORMAL HIGH (ref 150.0–400.0)
RBC: 4.24 Mil/uL (ref 3.87–5.11)
RDW: 15.7 % — ABNORMAL HIGH (ref 11.5–15.5)
WBC: 7.6 10*3/uL (ref 4.0–10.5)

## 2023-08-30 LAB — LIPID PANEL
Cholesterol: 182 mg/dL (ref 0–200)
HDL: 46.7 mg/dL (ref >39.00)
LDL Cholesterol: 108 mg/dL — ABNORMAL HIGH (ref 0–99)
NonHDL: 134.98
Total CHOL/HDL Ratio: 4
Triglycerides: 137 mg/dL (ref 0.0–149.0)
VLDL: 27.4 mg/dL (ref 0.0–40.0)

## 2023-08-30 LAB — HEMOGLOBIN A1C: Hgb A1c MFr Bld: 5.6 % (ref 4.6–6.5)

## 2023-08-30 MED ORDER — WEGOVY 1.7 MG/0.75ML ~~LOC~~ SOAJ: 1.7000 mg | SUBCUTANEOUS | 0 refills | Status: DC

## 2023-08-30 MED ORDER — FLUCONAZOLE 150 MG PO TABS: 150.0000 mg | ORAL_TABLET | Freq: Once | ORAL | 0 refills | Status: AC

## 2023-08-30 NOTE — Patient Instructions (Signed)
Nice to see you today I will be in touch with the labs once I have reviewed them Follow up with me in 3 months for a medication recheck

## 2023-08-30 NOTE — Progress Notes (Signed)
Established Patient Office Visit  Subjective   Patient ID: Kerry Martinez, female    DOB: Jan 05, 1986  Age: 36 y.o. MRN: [id number]  Chief Complaint  Patient presents with   Annual Exam    HPI  for complete physical and follow up of chronic conditions.   PA/fibromyalgia: Followed by Gerrie Nordmann   Neuropathy: [facility]   BH: Patient currently on Abilify, bupropion, Sinequan, prazosin, venlafaxine  Obesity: Patient currently maintained on Wegovy 1 mg currently  Crohns: Rhoni Vanga, GI with remicade infusion   HTN: Patient currently maintained on amlodipine 5 mg daily.  States blood pressures at home remain within normal limits.  States when she sees her psychiatrist is elevated blood pressure within normal limits here today  Chronic UTI area: Patient is followed by allergist  Immunizations: -Tetanus: Completed in 2023 -Influenza: usually gets it would like to defer for now as it seems early in the season -Shingles:  completed  -Pneumonia: Completed  UTD  Diet: Fair diet. 3 melas a day with llittle snacking. Dr. Reino Kent in the am. Water Exercise: . Patient is walking a mile at a time. States that she has been slack over the past month.  This is due to life changes  Eye exam: Completes annually. Wears glasses  Dental exam: Completes semi-annually. LTR    Colonoscopy: Completed in 12/02/2022 Lung Cancer Screening: NA  Pap smear: Patient currently followed by [facility] in [place] has IUD in place 06/2023 that was normal   Mammogram: Too young will need baseline age 38 due to family history  Sleep: states that she is going to bed around 930 and will get up around 7. Feels most of the time.  States that currently has life stressors. This is causing her sleep        Review of Systems  Constitutional:  Negative for chills and fever.  Respiratory:  Negative for shortness of breath.   Cardiovascular:  Negative for chest pain and leg swelling.   Gastrointestinal:  Negative for abdominal pain, blood in stool, constipation, diarrhea, nausea and vomiting.       Bm daily   Genitourinary:  Negative for dysuria and hematuria.  Neurological:  Negative for tingling and headaches.  Psychiatric/Behavioral:  Negative for hallucinations and suicidal ideas.       Objective:     BP 118/80   Pulse 86   Temp 98.2 F (36.8 C) (Temporal)   Ht 5\' 6"  (1.676 m)   Wt (!) 373 lb (169.2 kg)   SpO2 98%   BMI 60.20 kg/m  BP Readings from Last 3 Encounters:  08/30/23 118/80  06/21/23 136/88  05/24/23 (!) 150/98   Wt Readings from Last 3 Encounters:  08/30/23 (!) 373 lb (169.2 kg)  06/21/23 (!) 384 lb 6 oz (174.4 kg)  05/24/23 (!) 382 lb 8 oz (173.5 kg)      Physical Exam Vitals and nursing note reviewed.  Constitutional:      Appearance: Normal appearance.  HENT:     Right Ear: Tympanic membrane, ear canal and external ear normal.     Left Ear: Tympanic membrane, ear canal and external ear normal.     Mouth/Throat:     Mouth: Mucous membranes are moist.     Pharynx: Oropharynx is clear.  Eyes:     Extraocular Movements: Extraocular movements intact.     Pupils: Pupils are equal, round, and reactive to light.  Cardiovascular:     Rate and Rhythm: Normal rate  and regular rhythm.     Pulses: Normal pulses.     Heart sounds: Normal heart sounds.  Pulmonary:     Effort: Pulmonary effort is normal.     Breath sounds: Normal breath sounds.  Abdominal:     General: Bowel sounds are normal. There is no distension.     Palpations: There is no mass.     Tenderness: There is no abdominal tenderness.     Hernia: No hernia is present.  Musculoskeletal:     Right lower leg: No edema.     Left lower leg: No edema.  Lymphadenopathy:     Cervical: No cervical adenopathy.  Skin:    General: Skin is warm.  Neurological:     General: No focal deficit present.     Mental Status: She is alert.     Deep Tendon Reflexes:     Reflex  Scores:      Bicep reflexes are 2+ on the right side and 2+ on the left side.      Patellar reflexes are 2+ on the right side and 2+ on the left side.    Comments: Bilateral upper and lower extremity strength 5/5  Psychiatric:        Mood and Affect: Mood normal.        Behavior: Behavior normal.        Thought Content: Thought content normal.        Judgment: Judgment normal.      No results found for any visits on 08/30/23.    The ASCVD Risk score (Arnett DK, et al., 2019) failed to calculate for the following reasons:   The 2019 ASCVD risk score is only valid for ages 15 to 45    Assessment & Plan:   Problem List Items Addressed This Visit       Cardiovascular and Mediastinum   Primary hypertension    Patient currently maintained on amlodipine 5 mg daily.  Blood pressure well-controlled tolerating medication well.  Continue        Respiratory   Asthma    Patient currently followed by allergist is on Singulair and Asmanex.  Continue        Digestive   Crohn's disease, small intestine (HCC) (Chronic)    Patient is followed by GI.  Patient does at home infusions of Remicade.  Continue taking Remicade and following up with specialist        Endocrine   Acquired hypothyroidism    Patient currently maintained on levothyroxine.  Pending TSH today        Nervous and Auditory   Small fiber neuropathy    Patient is followed by neurology currently on gabapentin.  Continue      Relevant Medications   Semaglutide-Weight Management (WEGOVY) 1.7 MG/0.75ML SOAJ     Musculoskeletal and Integument   Psoriatic arthritis (HCC)    Patient is followed by rheumatology she is on methotrexate and folic acid.  Continue following with rheumatology and continue taking medication as prescribed        Other   Family history of breast cancer    Screening baseline mammogram at age 89      Vitamin B12 deficiency    History of the same in setting of gastric surgery. Pending  level       Relevant Orders   Vitamin B12   Folate deficiency    In setting of methotrexate use patient is on folic acid      Vitamin D deficiency  Patient on 50,000 units a week.history of gastric surgery       Relevant Orders   VITAMIN D 25 Hydroxy (Vit-D Deficiency, Fractures)   Morbid obesity (HCC)    Patient has lost weight.  Continue work on lifestyle modifications.  Wegovy 1.7 mg once weekly sent today.      Relevant Medications   Semaglutide-Weight Management (WEGOVY) 1.7 MG/0.75ML SOAJ   Other Relevant Orders   Hemoglobin A1c   Lipid panel   Fibromyalgia    Patient is followed by rheumatology continue      Preventative health care - Primary    Discussed age-appropriate immunizations and screening exams.  Patient is up-to-date on all age appropriate immunizations.  Minus the flu vaccine which she will get at a later date.  Patient is up-to-date with CRC screening in the setting of Crohn's.  Too young for breast cancer screening.  Patient was given information at discharge about preventative healthcare maintenance with anticipatory guidance.      Relevant Orders   CBC   Comprehensive metabolic panel   TSH   Prediabetes    Patient has lost approximately 10 pounds on Wegovy.  Continue work on lifestyle applications and taking medication as prescribed pending A1c today      Relevant Orders   Hemoglobin A1c   Other Visit Diagnoses     Encounter for screening for HIV       Relevant Orders   HIV antibody (with reflex)   Encounter for hepatitis C screening test for low risk patient       Relevant Orders   Hepatitis C Antibody       Return in about 3 months (around 11/29/2023) for Weight/wegovy recheck .    Audria Nine, NP

## 2023-08-30 NOTE — Assessment & Plan Note (Signed)
Patient is followed by GI.  Patient does at home infusions of Remicade.  Continue taking Remicade and following up with specialist

## 2023-08-30 NOTE — Assessment & Plan Note (Signed)
Patient is followed by neurology currently on gabapentin.  Continue

## 2023-08-30 NOTE — Assessment & Plan Note (Signed)
Patient has lost approximately 10 pounds on Wegovy.  Continue work on lifestyle applications and taking medication as prescribed pending A1c today

## 2023-08-30 NOTE — Assessment & Plan Note (Signed)
Patient has lost weight.  Continue work on lifestyle modifications.  Wegovy 1.7 mg once weekly sent today.

## 2023-08-30 NOTE — Assessment & Plan Note (Signed)
Patient currently maintained on amlodipine 5 mg daily.  Blood pressure well-controlled tolerating medication well.  Continue

## 2023-08-30 NOTE — Assessment & Plan Note (Signed)
Discussed age-appropriate immunizations and screening exams.  Patient is up-to-date on all age appropriate immunizations.  Minus the flu vaccine which she will get at a later date.  Patient is up-to-date with CRC screening in the setting of Crohn's.  Too young for breast cancer screening.  Patient was given information at discharge about preventative healthcare maintenance with anticipatory guidance.

## 2023-08-30 NOTE — Assessment & Plan Note (Signed)
History of the same in setting of gastric surgery. Pending level

## 2023-08-30 NOTE — Assessment & Plan Note (Signed)
Patient currently followed by allergist is on Singulair and Asmanex.  Continue

## 2023-08-30 NOTE — Assessment & Plan Note (Signed)
Screening baseline mammogram at age 37

## 2023-08-30 NOTE — Assessment & Plan Note (Signed)
Patient currently maintained on levothyroxine.  Pending TSH today

## 2023-08-30 NOTE — Assessment & Plan Note (Signed)
In setting of methotrexate use patient is on folic acid

## 2023-08-30 NOTE — Assessment & Plan Note (Signed)
Patient is followed by rheumatology she is on methotrexate and folic acid.  Continue following with rheumatology and continue taking medication as prescribed

## 2023-08-30 NOTE — Assessment & Plan Note (Signed)
Patient on 50,000 units a week.history of gastric surgery

## 2023-08-30 NOTE — Assessment & Plan Note (Signed)
Patient is followed by rheumatology continue

## 2023-08-31 ENCOUNTER — Other Ambulatory Visit: Payer: Self-pay

## 2023-08-31 LAB — VITAMIN B12: Vitamin B-12: 333 pg/mL (ref 211–911)

## 2023-08-31 LAB — HEPATITIS C ANTIBODY: Hepatitis C Ab: NONREACTIVE

## 2023-08-31 LAB — TSH: TSH: 2.99 u[IU]/mL (ref 0.35–5.50)

## 2023-08-31 LAB — VITAMIN D 25 HYDROXY (VIT D DEFICIENCY, FRACTURES): VITD: 30.88 ng/mL (ref 30.00–100.00)

## 2023-08-31 LAB — HIV ANTIBODY (ROUTINE TESTING W REFLEX): HIV 1&2 Ab, 4th Generation: NONREACTIVE

## 2023-09-01 DIAGNOSIS — H73891 Other specified disorders of tympanic membrane, right ear: Secondary | ICD-10-CM | POA: Diagnosis not present

## 2023-09-01 DIAGNOSIS — M545 Low back pain, unspecified: Secondary | ICD-10-CM | POA: Diagnosis not present

## 2023-09-01 DIAGNOSIS — H6121 Impacted cerumen, right ear: Secondary | ICD-10-CM | POA: Diagnosis not present

## 2023-09-01 DIAGNOSIS — M9903 Segmental and somatic dysfunction of lumbar region: Secondary | ICD-10-CM | POA: Diagnosis not present

## 2023-09-01 DIAGNOSIS — M9904 Segmental and somatic dysfunction of sacral region: Secondary | ICD-10-CM | POA: Diagnosis not present

## 2023-09-01 DIAGNOSIS — M9905 Segmental and somatic dysfunction of pelvic region: Secondary | ICD-10-CM | POA: Diagnosis not present

## 2023-09-01 DIAGNOSIS — H6123 Impacted cerumen, bilateral: Secondary | ICD-10-CM | POA: Diagnosis not present

## 2023-09-06 ENCOUNTER — Ambulatory Visit: Payer: BC Managed Care – PPO | Admitting: Gastroenterology

## 2023-09-06 DIAGNOSIS — M9903 Segmental and somatic dysfunction of lumbar region: Secondary | ICD-10-CM | POA: Diagnosis not present

## 2023-09-06 DIAGNOSIS — M9904 Segmental and somatic dysfunction of sacral region: Secondary | ICD-10-CM | POA: Diagnosis not present

## 2023-09-06 DIAGNOSIS — M9905 Segmental and somatic dysfunction of pelvic region: Secondary | ICD-10-CM | POA: Diagnosis not present

## 2023-09-06 DIAGNOSIS — M545 Low back pain, unspecified: Secondary | ICD-10-CM | POA: Diagnosis not present

## 2023-09-08 DIAGNOSIS — M9904 Segmental and somatic dysfunction of sacral region: Secondary | ICD-10-CM | POA: Diagnosis not present

## 2023-09-08 DIAGNOSIS — M545 Low back pain, unspecified: Secondary | ICD-10-CM | POA: Diagnosis not present

## 2023-09-08 DIAGNOSIS — M9903 Segmental and somatic dysfunction of lumbar region: Secondary | ICD-10-CM | POA: Diagnosis not present

## 2023-09-08 DIAGNOSIS — M9905 Segmental and somatic dysfunction of pelvic region: Secondary | ICD-10-CM | POA: Diagnosis not present

## 2023-09-09 DIAGNOSIS — F329 Major depressive disorder, single episode, unspecified: Secondary | ICD-10-CM | POA: Diagnosis not present

## 2023-09-12 ENCOUNTER — Telehealth: Payer: Self-pay

## 2023-09-12 NOTE — Telephone Encounter (Signed)
Called and left a message for call back  

## 2023-09-12 NOTE — Telephone Encounter (Signed)
-----   Message from [facility] sent at 09/06/2023  3:43 PM EDT ----- Call to make follow up appointment

## 2023-09-12 NOTE — Telephone Encounter (Signed)
Called and made appointment for 10/18/2023

## 2023-09-17 ENCOUNTER — Other Ambulatory Visit: Payer: Self-pay | Admitting: Nurse Practitioner

## 2023-09-17 DIAGNOSIS — I10 Essential (primary) hypertension: Secondary | ICD-10-CM

## 2023-09-20 DIAGNOSIS — F4312 Post-traumatic stress disorder, chronic: Secondary | ICD-10-CM | POA: Diagnosis not present

## 2023-09-20 DIAGNOSIS — F331 Major depressive disorder, recurrent, moderate: Secondary | ICD-10-CM | POA: Diagnosis not present

## 2023-09-21 DIAGNOSIS — M9905 Segmental and somatic dysfunction of pelvic region: Secondary | ICD-10-CM | POA: Diagnosis not present

## 2023-09-21 DIAGNOSIS — M545 Low back pain, unspecified: Secondary | ICD-10-CM | POA: Diagnosis not present

## 2023-09-21 DIAGNOSIS — M9903 Segmental and somatic dysfunction of lumbar region: Secondary | ICD-10-CM | POA: Diagnosis not present

## 2023-09-21 DIAGNOSIS — M9904 Segmental and somatic dysfunction of sacral region: Secondary | ICD-10-CM | POA: Diagnosis not present

## 2023-09-22 ENCOUNTER — Ambulatory Visit: Payer: BC Managed Care – PPO | Admitting: Nurse Practitioner

## 2023-09-22 DIAGNOSIS — H1045 Other chronic allergic conjunctivitis: Secondary | ICD-10-CM | POA: Diagnosis not present

## 2023-09-22 DIAGNOSIS — J4530 Mild persistent asthma, uncomplicated: Secondary | ICD-10-CM | POA: Diagnosis not present

## 2023-09-22 DIAGNOSIS — J3089 Other allergic rhinitis: Secondary | ICD-10-CM | POA: Diagnosis not present

## 2023-09-22 DIAGNOSIS — L501 Idiopathic urticaria: Secondary | ICD-10-CM | POA: Diagnosis not present

## 2023-09-23 DIAGNOSIS — F329 Major depressive disorder, single episode, unspecified: Secondary | ICD-10-CM | POA: Diagnosis not present

## 2023-09-27 ENCOUNTER — Encounter: Payer: Self-pay | Admitting: Nurse Practitioner

## 2023-09-28 DIAGNOSIS — M9905 Segmental and somatic dysfunction of pelvic region: Secondary | ICD-10-CM | POA: Diagnosis not present

## 2023-09-28 DIAGNOSIS — M545 Low back pain, unspecified: Secondary | ICD-10-CM | POA: Diagnosis not present

## 2023-09-28 DIAGNOSIS — M9903 Segmental and somatic dysfunction of lumbar region: Secondary | ICD-10-CM | POA: Diagnosis not present

## 2023-09-28 DIAGNOSIS — M9904 Segmental and somatic dysfunction of sacral region: Secondary | ICD-10-CM | POA: Diagnosis not present

## 2023-09-28 MED ORDER — WEGOVY 2.4 MG/0.75ML ~~LOC~~ SOAJ: 2.4000 mg | SUBCUTANEOUS | 0 refills | Status: DC

## 2023-10-03 ENCOUNTER — Other Ambulatory Visit: Payer: Self-pay | Admitting: Nurse Practitioner

## 2023-10-05 DIAGNOSIS — M9903 Segmental and somatic dysfunction of lumbar region: Secondary | ICD-10-CM | POA: Diagnosis not present

## 2023-10-05 DIAGNOSIS — M9905 Segmental and somatic dysfunction of pelvic region: Secondary | ICD-10-CM | POA: Diagnosis not present

## 2023-10-05 DIAGNOSIS — M545 Low back pain, unspecified: Secondary | ICD-10-CM | POA: Diagnosis not present

## 2023-10-05 DIAGNOSIS — M9904 Segmental and somatic dysfunction of sacral region: Secondary | ICD-10-CM | POA: Diagnosis not present

## 2023-10-07 DIAGNOSIS — F329 Major depressive disorder, single episode, unspecified: Secondary | ICD-10-CM | POA: Diagnosis not present

## 2023-10-10 DIAGNOSIS — K50919 Crohn's disease, unspecified, with unspecified complications: Secondary | ICD-10-CM | POA: Diagnosis not present

## 2023-10-11 DIAGNOSIS — K50919 Crohn's disease, unspecified, with unspecified complications: Secondary | ICD-10-CM | POA: Diagnosis not present

## 2023-10-12 ENCOUNTER — Other Ambulatory Visit: Payer: Self-pay

## 2023-10-13 ENCOUNTER — Other Ambulatory Visit: Payer: Self-pay

## 2023-10-18 ENCOUNTER — Encounter: Payer: Self-pay | Admitting: Gastroenterology

## 2023-10-18 ENCOUNTER — Ambulatory Visit (INDEPENDENT_AMBULATORY_CARE_PROVIDER_SITE_OTHER): Payer: BC Managed Care – PPO | Admitting: Gastroenterology

## 2023-10-18 VITALS — BP 137/91 | HR 50 | Temp 97.4°F | Ht 66.0 in | Wt 365.2 lb

## 2023-10-18 DIAGNOSIS — K50019 Crohn's disease of small intestine with unspecified complications: Secondary | ICD-10-CM

## 2023-10-18 NOTE — Progress Notes (Signed)
Kerry Repress, MD [address]  [address]  [place], [place] [address]  Main: [phone]  Fax: [phone]    Gastroenterology Consultation  Referring Provider:     Eden Emms, NP Primary Care Physician:  Kerry Emms, NP Primary Gastroenterologist:  Dr. Arlyss Martinez Reason for Consultation: Small bowel Crohn's        HPI:   Kerry Martinez is a 37 y.o. female referred by Kerry Emms, NP  for consultation & management of small bowel Crohn's.  Patient is diagnosed with ileal Crohn's in 01/2006.  She was on several rounds of prednisone during initial diagnosis, followed by Humira for about 3 years.  She underwent ileocecal resection with primary anastomosis due to stricture formation.  Humira was restarted postsurgery, however she was found to have antibodies against Humira and therefore it was discontinued.  Switched to Remicade approximately in 2015.  Patient has been on Remicade 5 mg/kg body weight every 8 weeks and reportedly patient has been doing well, in clinical remission.  She also reports that colonoscopy from 2022 performed at [facility] was normal.  Patient has a port in order to receive Remicade infusions.  Patient's primary GI doctor left [facility] and therefore patient switched her care to [facility].  Patient generally has 1-2 semiformed to formed bowel movements daily.  However, within last 2 to 3 weeks, she has been experiencing more loose stools and 3-4 times daily without any rectal bleeding.  She is also past due for Remicade infusion by 1 month.  Labs from 02/23/2022 including CBC, CMP were unremarkable.  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  Lives with her grandmother who is recently diagnosed with C. difficile and underwent FMT by rebyota  Follow-up visit 11/23/2022 Patient has reinitiated Remicade infusion on 8/31.  She had C. difficile infection 06/09/2022, treated with 10 days course of oral vancomycin.   Her diarrhea recurred in late August 2023, repeat stool studies came back positive for C. difficile, this episode was treated with 10 days course of fidaxomicin.  She has been doing well since treatment of C. Difficile infection.  She has problems accessing the port during last infusion.  Follow-up visit 10/18/2023 Kerry Martinez has been doing well with regards to Crohn's disease.  She does not have any GI concerns today.  She received 2 doses of Shingrix vaccine since her last visit with me.  She is due to receive flu vaccine.  She is compliant with Remicade every 8 weeks.  She has been up-to-date with monitoring labs and have been normal.  Colonoscopy in 11/2022 showed endoscopic remission of her Crohn's disease.  She is on Wegovy, lost about 20 pounds within last 3 to 4 months  Her grandmother passed away in Oct 02, 2023.  NSAIDs: None  Antiplts/Anticoagulants/Anti thrombotics: None  GI Procedures: Received colonoscopy in 01/2012 by [facility] gastroenterology Found to have moderate inflammation in the terminal ileum.  Pathology report not available with me today  Colonoscopy 12/02/2022 - Patent end- to- side ileo- colonic anastomosis, characterized by healthy appearing mucosa. - The examined portion of the ileum was normal. - The entire examined colon is normal. - The distal rectum and anal verge are normal on retroflexion view. - No specimens collected.  Past Medical History:  Diagnosis Date   Allergy    Anemia    Asthma    Crohn's disease (HCC)    Depression    Fibromyalgia  Migraine    Polycystic ovarian syndrome 12/27/2010   s/p endocrinology consult/[facility].   Psoriatic arthritis [facility])     Past Surgical History:  Procedure Laterality Date   Admission  11/10/12   coffee ground emesis, abdominal pain.  EGD with 3 gastric ulcers and HH.  s/p lab band reversal.  [facility].  GI consults, Bariatric Surgery Consult.   COLONOSCOPY W/ BIOPSIES     COLONOSCOPY WITH PROPOFOL N/A  12/02/2022   Procedure: COLONOSCOPY WITH PROPOFOL;  Surgeon: Toney Reil, MD;  Location: [facility] ENDOSCOPY;  Service: Gastroenterology;  Laterality: N/A;   ear graph  2004   ESOPHAGOGASTRODUODENOSCOPY  11/12/12   3 gastric ulcers, hiatal hernia.  [facility].   IR CV LINE INJECTION  02/15/2023   Lap band procedure  04/06/2011   Lap Band Reversal  11/12/12   [facility]   LAPAROSCOPIC GASTRIC SLEEVE RESECTION  03/27/2013   MASTOIDECTOMY  1991   PARTIAL COLECTOMY  12/26/12   Crohn's disease resection; [facility]   PORTA CATH INSERTION N/A 11/30/2021   Procedure: PORTA CATH INSERTION;  Surgeon: Annice Needy, MD;  Location: [facility] INVASIVE CV LAB;  Service: Cardiovascular;  Laterality: N/A;   TONSILLECTOMY  1997   TYMPANOSTOMY TUBE PLACEMENT  1990    Current Outpatient Medications:    albuterol (VENTOLIN HFA) 108 (90 Base) MCG/ACT inhaler, INHALE 1-2 PUFFS INTO THE LUNGS EVERY 6 (SIX) HOURS AS NEEDED FOR WHEEZING OR SHORTNESS OF BREATH. EVERY 4 TO 6 HOURS AS NEEDED, Disp: 8.5 each, Rfl: 1   amLODipine (NORVASC) 5 MG tablet, TAKE 1 TABLET (5 MG TOTAL) BY MOUTH DAILY., Disp: 90 tablet, Rfl: 1   ARIPiprazole (ABILIFY) 10 MG tablet, Take 10 mg by mouth daily., Disp: , Rfl:    ASMANEX, 120 METERED DOSES, 220 MCG/ACT inhaler, Inhale into the lungs., Disp: , Rfl:    azelastine (ASTELIN) 0.1 % nasal spray, Place 1 spray into both nostrils 2 (two) times daily. Use in each nostril as directed, Disp: 30 mL, Rfl: 0   buPROPion (WELLBUTRIN XL) 300 MG 24 hr tablet, Take 300 mg by mouth daily., Disp: , Rfl:    cetirizine (ZYRTEC ALLERGY) 10 MG tablet, 1 tablet, Disp: , Rfl:    clindamycin (CLINDAGEL) 1 % gel, Apply 1 Application topically 2 (two) times daily., Disp: , Rfl:    colestipol (COLESTID) 1 g tablet, Take 1 g by mouth 4 (four) times daily., Disp: , Rfl:    cyanocobalamin (VITAMIN B12) 1000 MCG/ML injection, INJECT 1 ML (1,000 MCG TOTAL) INTO THE MUSCLE EVERY 30 DAYS., Disp: 3 mL, Rfl: 2    doxepin (SINEQUAN) 10 MG capsule, 1 capsule at bedtime, Disp: , Rfl:    EPINEPHrine 0.3 mg/0.3 mL IJ SOAJ injection, See admin instructions., Disp: , Rfl:    famotidine (PEPCID) 40 MG tablet, Take 40 mg by mouth 2 (two) times daily., Disp: , Rfl:    fluticasone (FLONASE) 50 MCG/ACT nasal spray, SPRAY 1 TO 2 SPRAYS INTO EACH NOSTRIL ONCE A DAY, Disp: , Rfl:    folic acid (FOLVITE) 1 MG tablet, Take by mouth., Disp: , Rfl:    gabapentin (NEURONTIN) 600 MG tablet, Take 1 tablet (600 mg total) by mouth daily., Disp: 7 tablet, Rfl: 0   hydrocortisone 2.5 % ointment, As needed for psoriasis, Disp: , Rfl:    hydrOXYzine (ATARAX) 25 MG tablet, Take 25-50 mg by mouth at bedtime as needed., Disp: , Rfl:    ibuprofen (ADVIL) 800 MG tablet, Take 800 mg  by mouth every 8 (eight) hours as needed., Disp: , Rfl:    inFLIXimab (REMICADE) 100 MG injection, Inject into the vein., Disp: , Rfl:    ketotifen (ZADITOR) 0.025 % ophthalmic solution, 1 drop into affected eye, Disp: , Rfl:    levocetirizine (XYZAL ALLERGY 24HR) 5 MG tablet, Xyzal 5 mg tablet  Take 1 tablet every day by oral route., Disp: , Rfl:    levonorgestrel (MIRENA) 20 MCG/DAY IUD, 1 each by Intrauterine route once., Disp: , Rfl:    levothyroxine (SYNTHROID) 112 MCG tablet, TAKE 1 TABLET BY MOUTH EVERY DAY BEFORE BREAKFAST, Disp: 90 tablet, Rfl: 1   meclizine (ANTIVERT) 12.5 MG tablet, Take 1 tablet (12.5 mg total) by mouth 2 (two) times daily as needed for dizziness., Disp: 15 tablet, Rfl: 0   methotrexate 250 MG/10ML injection, Inject 20 mg into the skin once a week., Disp: , Rfl:    montelukast (SINGULAIR) 10 MG tablet, TAKE 1 TABLET BY MOUTH EVERYDAY AT BEDTIME, Disp: 90 tablet, Rfl: 3   Multiple Vitamins-Minerals (MULTIVITAMINS THER. W/MINERALS) TABS, Take 1 tablet by mouth daily., Disp: , Rfl:    Nutritional Supplements (VITAMIN D PLUS COFACTORS) TABS, See admin instructions., Disp: , Rfl:    ondansetron (ZOFRAN) 4 MG tablet, Take 4 mg by mouth 3  (three) times daily as needed., Disp: , Rfl:    prazosin (MINIPRESS) 2 MG capsule, Take 4 capsules by mouth daily., Disp: , Rfl:    prazosin (MINIPRESS) 5 MG capsule, Take 5 mg by mouth at bedtime., Disp: , Rfl:    Semaglutide-Weight Management (WEGOVY) 2.4 MG/0.75ML SOAJ, Inject 2.4 mg into the skin once a week., Disp: 9 mL, Rfl: 0   tacrolimus (PROTOPIC) 0.1 % ointment, Apply topically daily as needed., Disp: , Rfl:    tamsulosin (FLOMAX) 0.4 MG CAPS capsule, Take 0.4 mg by mouth daily., Disp: , Rfl:    Tuberculin-Allergy Syringes (MONOJECT 1CC TB SYR 28GX1/2") 28G X 1/2" 1 ML MISC, Use as directed 0.8 ml every seven days with Methotrexate Solution, Disp: , Rfl:    venlafaxine (EFFEXOR) 100 MG tablet, Take 3 tablets once daily, Disp: , Rfl:    Vitamin D, Ergocalciferol, (DRISDOL) 1.25 MG (50000 UNIT) CAPS capsule, TAKE 1 CAPSULE (50,000 UNITS TOTAL) BY MOUTH EVERY 7 (SEVEN) DAYS, Disp: 12 capsule, Rfl: 1    Family History  Problem Relation Age of Onset   ALS Mother    Cancer Mother        precancerous polyps in colon   Asthma Mother    Depression Mother    Diabetes Mother    Hyperlipidemia Mother    Hypertension Mother    Heart disease Father    Diabetes Father    Hypertension Father    Hyperlipidemia Father    Other Sister        fibromyalgia   Fibromyalgia Sister    Hypertension Brother    Kidney disease Maternal Grandmother    Diabetes Maternal Grandmother    Heart disease Maternal Grandfather    Kidney disease Maternal Grandfather    Diabetes Maternal Grandfather    Kidney disease Paternal Grandmother    Diabetes Paternal Grandmother    Heart disease Paternal Grandfather    Diabetes Paternal Grandfather    Parkinson's disease Paternal Grandfather      Social History   Tobacco Use   Smoking status: Never   Smokeless tobacco: Never  Vaping Use   Vaping status: Never Used  Substance Use Topics   Alcohol use:  Yes    Comment: socially   Drug use: No     Allergies as of 10/18/2023 - Review Complete 10/18/2023  Allergen Reaction Noted   Amoxicillin Hives 06/25/2011   Cefprozil Hives 06/25/2011   Iodinated contrast media Anaphylaxis 11/13/2020   Abacavir Itching 09/14/2019   Cephalosporins Hives and Itching 01/30/2012   Cat hair extract  08/25/2021   Multihance [gadobenate]  08/16/2013   Sulfa antibiotics Hives and Other (See Comments) 08/02/2018    Review of Systems:    All systems reviewed and negative except where noted in HPI.   Physical Exam:  BP (!) 137/91 (BP Location: Left Arm, Patient Position: Sitting, Cuff Size: Normal) Comment (BP Location): lower arm  Pulse (!) 50   Temp (!) 97.4 F (36.3 C) (Oral)   Ht 5\' 6"  (1.676 m)   Wt (!) 365 lb 4 oz (165.7 kg)   BMI 58.95 kg/m  No LMP recorded. (Menstrual status: IUD).  General:   Alert,  Well-developed, well-nourished, pleasant and cooperative in NAD Head:  Normocephalic and atraumatic. Eyes:  Sclera clear, no icterus.   Conjunctiva pink. Ears:  Normal auditory acuity. Nose:  No deformity, discharge, or lesions. Mouth:  No deformity or lesions,oropharynx pink & moist. Neck:  Supple; no masses or thyromegaly. Lungs:  Respirations even and unlabored.  Clear throughout to auscultation.   No wheezes, crackles, or rhonchi. No acute distress. Heart:  Regular rate and rhythm; no murmurs, clicks, rubs, or gallops. Abdomen:  Normal bowel sounds. Soft, morbidly obese, non-tender and non-distended without masses, hepatosplenomegaly or hernias noted.  No guarding or rebound tenderness.   Rectal: Not performed Msk:  Symmetrical without gross deformities. Good, equal movement & strength bilaterally. Pulses:  Normal pulses noted. Extremities:  No clubbing or edema.  No cyanosis. Neurologic:  Alert and oriented x3;  grossly normal neurologically. Skin:  Intact without significant lesions or rashes. No jaundice. Psych:  Alert and cooperative. Normal mood and affect.  Imaging  Studies: None recently  Assessment and Plan:   Kerry Martinez is a 37 y.o. pleasant Caucasian female with morbid obesity, psoriatic arthritis, PCOS, depression, asthma, ileal Crohn's diagnosed in 2007, s/p ileocolonic resection, previously on Humira, discontinued due to antibody formation, switched to Remicade in 2015, has been maintained on Remicade 5 mg/kg body weight every 8 weeks.    Small bowel Crohn's, s/p ileocolic resection with exacerbation in June 2023 secondary to C. difficile infection.  Treated with 10 days course of oral vancomycin.  Had first recurrence in August 2023, treated with 10 days course of fidaxomicin. Colonoscopy in 11/2022 revealed endoscopic remission Continue Remicade 5 mg/kg body weight every 8 weeks Recommend to check labs with every other infusion   IBD Health Maintenance  1.TB status: QuantiFERON gold 03/18/2022 negative 2. Anemia: None 3.Immunizations: Hep A and B up to date, Influenza recommend annual influenza vaccine up-to-date, received COVID booster, prevnar 06/2016, pneumovax 11/2012, Varicella unknown, received Shingrix vaccine x 2 doses in 2023 and 2024 4.Cancer screening I) Colon cancer/dysplasia surveillance:N/A II) Cervical cancer: Up-to-date III) Skin cancer - counseled about annual skin exam by dermatology and skin protection in summer using sun screen SPF > 50, clothing 5.Bone health Vitamin D status: Normal Bone density testing: Not done 5. Labs: With every other infusion 6. Smoking: Never smoked 7. NSAIDs and Antibiotics use: None  Follow up annually or sooner if needed  Kerry Repress, MD

## 2023-10-19 DIAGNOSIS — F329 Major depressive disorder, single episode, unspecified: Secondary | ICD-10-CM | POA: Diagnosis not present

## 2023-10-20 DIAGNOSIS — F331 Major depressive disorder, recurrent, moderate: Secondary | ICD-10-CM | POA: Diagnosis not present

## 2023-10-20 DIAGNOSIS — F4312 Post-traumatic stress disorder, chronic: Secondary | ICD-10-CM | POA: Diagnosis not present

## 2023-10-25 ENCOUNTER — Other Ambulatory Visit: Payer: Self-pay | Admitting: Nurse Practitioner

## 2023-10-25 DIAGNOSIS — J4520 Mild intermittent asthma, uncomplicated: Secondary | ICD-10-CM

## 2023-11-07 ENCOUNTER — Other Ambulatory Visit: Payer: Self-pay | Admitting: Nurse Practitioner

## 2023-11-07 ENCOUNTER — Encounter: Payer: Self-pay | Admitting: Nurse Practitioner

## 2023-11-07 DIAGNOSIS — F329 Major depressive disorder, single episode, unspecified: Secondary | ICD-10-CM | POA: Diagnosis not present

## 2023-11-07 MED ORDER — ERYTHROMYCIN 5 MG/GM OP OINT: 1.0000 | TOPICAL_OINTMENT | Freq: Four times a day (QID) | OPHTHALMIC | 0 refills | Status: AC

## 2023-11-07 NOTE — Telephone Encounter (Signed)
Called patient. For the last 3 days. Patient states that eye is watery and lid is swollen. States eye is not red and no changes in vision. She has been doing warm compresses and they are not helping. Informed patient that Susy Frizzle has sent in ointment and if no improvement will need office visit.

## 2023-11-18 DIAGNOSIS — F4312 Post-traumatic stress disorder, chronic: Secondary | ICD-10-CM | POA: Diagnosis not present

## 2023-11-18 DIAGNOSIS — F331 Major depressive disorder, recurrent, moderate: Secondary | ICD-10-CM | POA: Diagnosis not present

## 2023-11-21 DIAGNOSIS — F329 Major depressive disorder, single episode, unspecified: Secondary | ICD-10-CM | POA: Diagnosis not present

## 2023-11-26 ENCOUNTER — Other Ambulatory Visit: Payer: Self-pay | Admitting: Nurse Practitioner

## 2023-11-29 ENCOUNTER — Encounter: Payer: Self-pay | Admitting: Nurse Practitioner

## 2023-11-29 ENCOUNTER — Ambulatory Visit (INDEPENDENT_AMBULATORY_CARE_PROVIDER_SITE_OTHER): Payer: BC Managed Care – PPO | Admitting: Nurse Practitioner

## 2023-11-29 NOTE — Patient Instructions (Signed)
Nice to see you today We will continue the wegovy Follow up with me in 3 months.

## 2023-11-29 NOTE — Assessment & Plan Note (Signed)
Patient currently maintained on Wegovy 2.4 mg once weekly.  Tolerating medication well.  Patient has had a 20 pound weight loss since June 2024.  Patient has decreased activity as of late due to weather.  Encourage working on healthy lifestyle modifications continue Wegovy 2.4 mg once weekly

## 2023-11-29 NOTE — Progress Notes (Signed)
   Established Patient Office Visit  Subjective   Patient ID: Kerry Martinez, female    DOB: 1986-10-01  Age: 37 y.o. MRN: [id number]  Chief Complaint  Patient presents with   Follow-up    Weight and wegovy    HPI  Obeisty: patient was started on wegovy States that she barley eats. States that she will attempt to get 2 meals a day and mostly gets that. Does not snack. Dr pepper in the am, water, and half and half tea. States that since it has been cold the activity has decreased.     Review of Systems  Constitutional:  Negative for chills and fever.  Respiratory:  Negative for shortness of breath.   Cardiovascular:  Negative for chest pain.  Gastrointestinal:  Negative for abdominal pain, constipation, nausea and vomiting.  Neurological:  Negative for dizziness and headaches.      Objective:     BP 122/80   Pulse 82   Temp 97.6 F (36.4 C) (Oral)   Ht 5\' 6"  (1.676 m)   Wt (!) 362 lb 3.2 oz (164.3 kg)   SpO2 100%   BMI 58.46 kg/m  BP Readings from Last 3 Encounters:  11/29/23 122/80  10/18/23 (!) 137/91  08/30/23 118/80   Wt Readings from Last 3 Encounters:  11/29/23 (!) 362 lb 3.2 oz (164.3 kg)  10/18/23 (!) 365 lb 4 oz (165.7 kg)  08/30/23 (!) 373 lb (169.2 kg)   SpO2 Readings from Last 3 Encounters:  11/29/23 100%  08/30/23 98%  06/21/23 99%    Physical Exam Vitals and nursing note reviewed.  Constitutional:      Appearance: Normal appearance.  Cardiovascular:     Rate and Rhythm: Normal rate and regular rhythm.     Heart sounds: Normal heart sounds.  Pulmonary:     Effort: Pulmonary effort is normal.     Breath sounds: Normal breath sounds.  Abdominal:     General: Bowel sounds are normal.  Neurological:     Mental Status: She is alert.      No results found for any visits on 11/29/23.    The ASCVD Risk score (Arnett DK, et al., 2019) failed to calculate for the following reasons:   The 2019 ASCVD risk score is only valid for ages 36  to 76    Assessment & Plan:   Problem List Items Addressed This Visit       Other   Morbid obesity (HCC) - Primary    Patient currently maintained on Wegovy 2.4 mg once weekly.  Tolerating medication well.  Patient has had a 20 pound weight loss since June 2024.  Patient has decreased activity as of late due to weather.  Encourage working on healthy lifestyle modifications continue Wegovy 2.4 mg once weekly       Return in about 3 months (around 02/27/2024) for wegovy and weight check.Audria Nine, NP

## 2023-12-05 DIAGNOSIS — K50919 Crohn's disease, unspecified, with unspecified complications: Secondary | ICD-10-CM | POA: Diagnosis not present

## 2023-12-08 DIAGNOSIS — K50919 Crohn's disease, unspecified, with unspecified complications: Secondary | ICD-10-CM | POA: Diagnosis not present

## 2023-12-16 DIAGNOSIS — F329 Major depressive disorder, single episode, unspecified: Secondary | ICD-10-CM | POA: Diagnosis not present

## 2023-12-19 ENCOUNTER — Other Ambulatory Visit: Payer: Self-pay | Admitting: Nurse Practitioner

## 2023-12-19 MED ORDER — WEGOVY 2.4 MG/0.75ML ~~LOC~~ SOAJ: 2.4000 mg | SUBCUTANEOUS | 0 refills | Status: DC

## 2024-01-04 DIAGNOSIS — F329 Major depressive disorder, single episode, unspecified: Secondary | ICD-10-CM | POA: Diagnosis not present

## 2024-01-11 ENCOUNTER — Telehealth: Payer: Self-pay

## 2024-01-11 NOTE — Telephone Encounter (Signed)
Informed patient by mychart  ?

## 2024-01-11 NOTE — Telephone Encounter (Signed)
-----   Message from [facility] sent at 01/11/2024  1:57 PM EST ----- Normal CBC and LFTs, recommend recheck with every other infusion  RV

## 2024-01-16 DIAGNOSIS — F4312 Post-traumatic stress disorder, chronic: Secondary | ICD-10-CM | POA: Diagnosis not present

## 2024-01-16 DIAGNOSIS — F331 Major depressive disorder, recurrent, moderate: Secondary | ICD-10-CM | POA: Diagnosis not present

## 2024-01-18 DIAGNOSIS — F329 Major depressive disorder, single episode, unspecified: Secondary | ICD-10-CM | POA: Diagnosis not present

## 2024-01-25 ENCOUNTER — Other Ambulatory Visit: Payer: Self-pay | Admitting: Nurse Practitioner

## 2024-01-31 ENCOUNTER — Ambulatory Visit: Payer: BC Managed Care – PPO | Admitting: Nurse Practitioner

## 2024-01-31 ENCOUNTER — Encounter: Payer: Self-pay | Admitting: Nurse Practitioner

## 2024-01-31 VITALS — BP 124/68 | HR 104 | Temp 97.7°F | Ht 66.0 in | Wt 356.8 lb

## 2024-01-31 DIAGNOSIS — N898 Other specified noninflammatory disorders of vagina: Secondary | ICD-10-CM | POA: Diagnosis not present

## 2024-01-31 DIAGNOSIS — R300 Dysuria: Secondary | ICD-10-CM

## 2024-01-31 DIAGNOSIS — R8290 Unspecified abnormal findings in urine: Secondary | ICD-10-CM

## 2024-01-31 LAB — POCT URINALYSIS DIPSTICK
Bilirubin, UA: POSITIVE
Blood, UA: NEGATIVE
Glucose, UA: NEGATIVE
Ketones, UA: NEGATIVE
Leukocytes, UA: NEGATIVE
Nitrite, UA: NEGATIVE
Protein, UA: POSITIVE — AB
Spec Grav, UA: 1.025 (ref 1.010–1.025)
Urobilinogen, UA: NEGATIVE U/dL — AB
pH, UA: 5.5 (ref 5.0–8.0)

## 2024-01-31 NOTE — Assessment & Plan Note (Signed)
Given patient is immune compromised we will send off for urine culture since urinalysis was abnormal.  Pending result

## 2024-01-31 NOTE — Assessment & Plan Note (Signed)
 UA in office.

## 2024-01-31 NOTE — Progress Notes (Signed)
 Acute Office Visit  Subjective:     Patient ID: Kerry Martinez, female    DOB: [birth year], 38 y.o.   MRN: [id number]  Chief Complaint  Patient presents with   Urinary Tract Infection    Ongoing for about a week, symptoms include itching, burning and some discharge. No frequency in going. Think its a UTI or BV     Patient is in today for vaginal itching   Symptoms started approx 1 week ago. She is having dysuria, itching and burning. States the vaginal discharge is thick, no odor or color. No recent antibiotic. Last intercourse was 2 weeks ago.  With a new partner.  Review of Systems  Constitutional:  Negative for chills and fever.  Respiratory:  Negative for shortness of breath.   Cardiovascular:  Negative for chest pain.  Gastrointestinal:  Negative for abdominal pain, nausea and vomiting.  Genitourinary:  Positive for dysuria. Negative for frequency and hematuria.       + vaginal discharge Itching   Neurological:  Negative for headaches.  Psychiatric/Behavioral:  Negative for hallucinations and suicidal ideas.         Objective:    BP 124/68   Pulse (!) 104   Temp 97.7 F (36.5 C) (Oral)   Ht 5' 6 (1.676 m)   Wt (!) 356 lb 12.8 oz (161.8 kg)   SpO2 90%   BMI 57.59 kg/m  BP Readings from Last 3 Encounters:  01/31/24 124/68  11/29/23 122/80  10/18/23 (!) 137/91   Wt Readings from Last 3 Encounters:  01/31/24 (!) 356 lb 12.8 oz (161.8 kg)  11/29/23 (!) 362 lb 3.2 oz (164.3 kg)  10/18/23 (!) 365 lb 4 oz (165.7 kg)   SpO2 Readings from Last 3 Encounters:  01/31/24 90%  11/29/23 100%  08/30/23 98%      Physical Exam Vitals and nursing note reviewed.  Constitutional:      Appearance: Normal appearance.  Cardiovascular:     Rate and Rhythm: Normal rate and regular rhythm.     Heart sounds: Normal heart sounds.  Pulmonary:     Effort: Pulmonary effort is normal.     Breath sounds: Normal breath sounds.  Abdominal:     General: Bowel sounds are  normal. There is no distension.     Palpations: There is no mass.     Tenderness: There is no abdominal tenderness. There is no right CVA tenderness or left CVA tenderness.     Hernia: No hernia is present.  Neurological:     Mental Status: She is alert.     Results for orders placed or performed in visit on 01/31/24  POCT urinalysis dipstick  Result Value Ref Range   Color, UA     Clarity, UA     Glucose, UA Negative Negative   Bilirubin, UA POSITIVE    Ketones, UA NEGATIVE    Spec Grav, UA 1.025 1.010 - 1.025   Blood, UA NEGATIVE    pH, UA 5.5 5.0 - 8.0   Protein, UA Positive (A) Negative   Urobilinogen, UA negative (A) 0.2 or 1.0 E.U./dL   Nitrite, UA NEGATIVE    Leukocytes, UA Negative Negative   Appearance     Odor          Assessment & Plan:   Problem List Items Addressed This Visit       Other   Dysuria - Primary   UA in office.      Relevant Orders  POCT urinalysis dipstick (Completed)   Vaginal discharge   Patient did self swab wet prep.  If negative consider STI testing.  If that is negative refer back to GYN for further evaluation and management.      Relevant Orders   WET PREP BY MOLECULAR PROBE   Abnormal urinalysis   Given patient is immune compromised we will send off for urine culture since urinalysis was abnormal.  Pending result      Relevant Orders   Urine Culture    No orders of the defined types were placed in this encounter.   Return in about 6 weeks (around 03/13/2024) for wegovy /weight recheck .  Adina Crandall, NP

## 2024-01-31 NOTE — Assessment & Plan Note (Signed)
Patient did self swab wet prep.  If negative consider STI testing.  If that is negative refer back to GYN for further evaluation and management.

## 2024-02-01 LAB — WET PREP BY MOLECULAR PROBE
Candida species: NOT DETECTED
MICRO NUMBER:: 16039133
SPECIMEN QUALITY:: ADEQUATE
Trichomonas vaginosis: NOT DETECTED

## 2024-02-02 ENCOUNTER — Encounter: Payer: Self-pay | Admitting: Nurse Practitioner

## 2024-02-02 ENCOUNTER — Other Ambulatory Visit: Payer: Self-pay | Admitting: Nurse Practitioner

## 2024-02-02 DIAGNOSIS — N760 Acute vaginitis: Secondary | ICD-10-CM

## 2024-02-02 LAB — URINE CULTURE
MICRO NUMBER:: 16039128
Result:: NO GROWTH
SPECIMEN QUALITY:: ADEQUATE

## 2024-02-02 MED ORDER — METRONIDAZOLE 500 MG PO TABS: 500.0000 mg | ORAL_TABLET | Freq: Two times a day (BID) | ORAL | 0 refills | Status: AC

## 2024-02-12 ENCOUNTER — Other Ambulatory Visit: Payer: Self-pay | Admitting: Nurse Practitioner

## 2024-02-16 DIAGNOSIS — F329 Major depressive disorder, single episode, unspecified: Secondary | ICD-10-CM | POA: Diagnosis not present

## 2024-02-17 DIAGNOSIS — F331 Major depressive disorder, recurrent, moderate: Secondary | ICD-10-CM | POA: Diagnosis not present

## 2024-02-17 DIAGNOSIS — F4312 Post-traumatic stress disorder, chronic: Secondary | ICD-10-CM | POA: Diagnosis not present

## 2024-02-20 DIAGNOSIS — K50919 Crohn's disease, unspecified, with unspecified complications: Secondary | ICD-10-CM | POA: Diagnosis not present

## 2024-02-22 DIAGNOSIS — K50919 Crohn's disease, unspecified, with unspecified complications: Secondary | ICD-10-CM | POA: Diagnosis not present

## 2024-02-29 ENCOUNTER — Encounter: Payer: Self-pay | Admitting: Nurse Practitioner

## 2024-02-29 ENCOUNTER — Ambulatory Visit: Payer: BC Managed Care – PPO | Admitting: Nurse Practitioner

## 2024-02-29 DIAGNOSIS — Z6841 Body Mass Index (BMI) 40.0 and over, adult: Secondary | ICD-10-CM | POA: Diagnosis not present

## 2024-02-29 NOTE — Progress Notes (Signed)
   Established Patient Office Visit  Subjective   Patient ID: Kerry Martinez, female    DOB: December 01, 1986  Age: 38 y.o. MRN: [id number]  Chief Complaint  Patient presents with   Medical Management of Chronic Issues    3 mo Wegovy and weight f/u    HPI  Obesity: patient is currently maintained on wegovy 2.4mg  once a week  She is eating 2 meals a days with snacks. She is doing home cooked meals and tries to do protien heavy She is doing cardio twice a week for approx at a time States that her crohns has been in remission Statse that she is not tracking her diet well.   Patients weight was 382 at the start of treatment.  Current weight of 360 pounds with a total loss of 22 pounds since initiation of therapy  She is drinking Dr. Reino Kent, and water.    Review of Systems  Constitutional:  Negative for chills and fever.  Respiratory:  Negative for shortness of breath.   Cardiovascular:  Negative for chest pain.  Gastrointestinal:  Negative for abdominal pain, constipation, diarrhea, nausea and vomiting.      Objective:     BP 128/82   Pulse 97   Temp 98.2 F (36.8 C) (Temporal)   Ht 5\' 6"  (1.676 m)   Wt (!) 360 lb (163.3 kg)   SpO2 97%   BMI 58.11 kg/m  BP Readings from Last 3 Encounters:  02/29/24 128/82  01/31/24 124/68  11/29/23 122/80   Wt Readings from Last 3 Encounters:  02/29/24 (!) 360 lb (163.3 kg)  01/31/24 (!) 356 lb 12.8 oz (161.8 kg)  11/29/23 (!) 362 lb 3.2 oz (164.3 kg)   SpO2 Readings from Last 3 Encounters:  02/29/24 97%  01/31/24 90%  11/29/23 100%      Physical Exam Vitals and nursing note reviewed.  Constitutional:      Appearance: Normal appearance.  Cardiovascular:     Rate and Rhythm: Normal rate and regular rhythm.     Heart sounds: Normal heart sounds.  Pulmonary:     Effort: Pulmonary effort is normal.     Breath sounds: Normal breath sounds.  Abdominal:     General: Bowel sounds are normal.  Neurological:     Mental  Status: She is alert.      No results found for any visits on 02/29/24.    The ASCVD Risk score (Arnett DK, et al., 2019) failed to calculate for the following reasons:   The 2019 ASCVD risk score is only valid for ages 55 to 66    Assessment & Plan:   Problem List Items Addressed This Visit       Other   Morbid obesity (HCC) - Primary   Continue wegovy 2.4 mg weekly. Encouraged cutting back on sugary drinks and continue with lifestyle modifications.        Return in about 3 months (around 05/31/2024) for weight management.    Audria Nine, NP

## 2024-02-29 NOTE — Assessment & Plan Note (Addendum)
 Continue wegovy 2.4 mg weekly. Encouraged cutting back on sugary drinks and continue with lifestyle modifications.   I evaluated patient, was consulted regarding treatment, and agree with assessment and plan per Denice Bors RN, FNP Student   Audria Nine, DNP, AGNP-C

## 2024-02-29 NOTE — Progress Notes (Signed)
   Established Patient Office Visit  Subjective   Patient ID: Kerry Martinez, female    DOB: 1986-03-16  Age: 38 y.o. MRN: [id number]  Chief Complaint  Patient presents with   Medical Management of Chronic Issues    3 mo Wegovy and weight f/u    HPI  [name] is here today for a recheck on her weight management. She does have a hx of Crohn's, PUD, vitamin B12, D, and folate deficiencies. She is currently maintained on Wegovey 2.4 mg weekly. Pt has been tolerating the medication well with no N/V and is having daily regular BMs. Chron's in remission for 4 years. Still has occasional abd pain and diarrhea however she has had formed BMs ever since starting the [facility]. Pt eating 2 meals a day with some snacks and soda. She exercise 2 times per week of cardio for 45 minutes.     Review of Systems  Constitutional:  Negative for chills, fever and weight loss.  Respiratory:  Negative for shortness of breath.   Cardiovascular:  Negative for chest pain and palpitations.  Gastrointestinal:  Negative for abdominal pain, nausea and vomiting.  Musculoskeletal:  Negative for myalgias.      Objective:     BP 128/82   Pulse 97   Temp 98.2 F (36.8 C) (Temporal)   Ht 5\' 6"  (1.676 m)   Wt (!) 163.3 kg   SpO2 97%   BMI 58.11 kg/m  BP Readings from Last 3 Encounters:  02/29/24 128/82  01/31/24 124/68  11/29/23 122/80   Wt Readings from Last 3 Encounters:  02/29/24 (!) 163.3 kg  01/31/24 (!) 161.8 kg  11/29/23 (!) 164.3 kg   SpO2 Readings from Last 3 Encounters:  02/29/24 97%  01/31/24 90%  11/29/23 100%      Physical Exam Constitutional:      Appearance: Normal appearance.  Cardiovascular:     Rate and Rhythm: Normal rate and regular rhythm.     Heart sounds: Normal heart sounds. No murmur heard.    No friction rub. No gallop.  Pulmonary:     Effort: Pulmonary effort is normal.     Breath sounds: Normal breath sounds.  Neurological:     Mental Status: She is alert and  oriented to person, place, and time.  Psychiatric:        Mood and Affect: Mood normal.        Behavior: Behavior normal.      No results found for any visits on 02/29/24.    The ASCVD Risk score (Arnett DK, et al., 2019) failed to calculate for the following reasons:   The 2019 ASCVD risk score is only valid for ages 14 to 74    Assessment & Plan:   Problem List Items Addressed This Visit     Morbid obesity (HCC) - Primary   Continue wegovy 2.4 mg weekly. Encouraged cutting back on sugary drinks and continue with lifestyle modifications.        Return in about 3 months (around 05/31/2024) for weight management.    Murvin Donning, RN

## 2024-03-02 DIAGNOSIS — H9201 Otalgia, right ear: Secondary | ICD-10-CM | POA: Diagnosis not present

## 2024-03-02 DIAGNOSIS — F329 Major depressive disorder, single episode, unspecified: Secondary | ICD-10-CM | POA: Diagnosis not present

## 2024-03-02 DIAGNOSIS — H9211 Otorrhea, right ear: Secondary | ICD-10-CM | POA: Diagnosis not present

## 2024-03-02 DIAGNOSIS — J069 Acute upper respiratory infection, unspecified: Secondary | ICD-10-CM | POA: Diagnosis not present

## 2024-03-02 DIAGNOSIS — H6121 Impacted cerumen, right ear: Secondary | ICD-10-CM | POA: Diagnosis not present

## 2024-03-17 ENCOUNTER — Other Ambulatory Visit: Payer: Self-pay | Admitting: Nurse Practitioner

## 2024-03-17 DIAGNOSIS — E559 Vitamin D deficiency, unspecified: Secondary | ICD-10-CM

## 2024-03-19 ENCOUNTER — Other Ambulatory Visit: Payer: Self-pay | Admitting: Nurse Practitioner

## 2024-03-19 DIAGNOSIS — F4312 Post-traumatic stress disorder, chronic: Secondary | ICD-10-CM | POA: Diagnosis not present

## 2024-03-19 DIAGNOSIS — F331 Major depressive disorder, recurrent, moderate: Secondary | ICD-10-CM | POA: Diagnosis not present

## 2024-03-21 DIAGNOSIS — F329 Major depressive disorder, single episode, unspecified: Secondary | ICD-10-CM | POA: Diagnosis not present

## 2024-03-24 ENCOUNTER — Other Ambulatory Visit: Payer: Self-pay | Admitting: Nurse Practitioner

## 2024-03-24 DIAGNOSIS — I10 Essential (primary) hypertension: Secondary | ICD-10-CM

## 2024-04-03 ENCOUNTER — Other Ambulatory Visit (HOSPITAL_COMMUNITY): Payer: Self-pay

## 2024-04-03 ENCOUNTER — Telehealth: Payer: Self-pay

## 2024-04-03 NOTE — Telephone Encounter (Signed)
*  Primary  Pharmacy Patient Advocate Encounter   Received notification from CoverMyMeds that prior authorization for Wegovy 2.4MG /0.75ML auto-injectors  is required/requested.   Insurance verification completed.   The patient is insured through [employer] .   Per test claim: PA required; PA submitted to above mentioned insurance via CoverMyMeds Key/confirmation #/EOC [id number] Status is pending

## 2024-04-03 NOTE — Telephone Encounter (Signed)
 Pharmacy Patient Advocate Encounter  Received notification from [employer] that Prior Authorization for [facility] 2.4mg  has been APPROVED from 04/03/2024 to 04/03/2025. Ran test claim, Copay is $74.99/84 days. This test claim was processed through [facility]- copay amounts may vary at other pharmacies due to pharmacy/plan contracts, or as the patient moves through the different stages of their insurance plan.

## 2024-04-09 DIAGNOSIS — F329 Major depressive disorder, single episode, unspecified: Secondary | ICD-10-CM | POA: Diagnosis not present

## 2024-04-14 ENCOUNTER — Other Ambulatory Visit: Payer: Self-pay | Admitting: Nurse Practitioner

## 2024-04-16 DIAGNOSIS — K50919 Crohn's disease, unspecified, with unspecified complications: Secondary | ICD-10-CM | POA: Diagnosis not present

## 2024-04-19 DIAGNOSIS — K50919 Crohn's disease, unspecified, with unspecified complications: Secondary | ICD-10-CM | POA: Diagnosis not present

## 2024-04-19 DIAGNOSIS — F331 Major depressive disorder, recurrent, moderate: Secondary | ICD-10-CM | POA: Diagnosis not present

## 2024-04-19 DIAGNOSIS — F4312 Post-traumatic stress disorder, chronic: Secondary | ICD-10-CM | POA: Diagnosis not present

## 2024-05-02 DIAGNOSIS — F329 Major depressive disorder, single episode, unspecified: Secondary | ICD-10-CM | POA: Diagnosis not present

## 2024-05-23 DIAGNOSIS — F329 Major depressive disorder, single episode, unspecified: Secondary | ICD-10-CM | POA: Diagnosis not present

## 2024-05-24 DIAGNOSIS — F4312 Post-traumatic stress disorder, chronic: Secondary | ICD-10-CM | POA: Diagnosis not present

## 2024-05-24 DIAGNOSIS — F331 Major depressive disorder, recurrent, moderate: Secondary | ICD-10-CM | POA: Diagnosis not present

## 2024-05-27 ENCOUNTER — Other Ambulatory Visit: Payer: Self-pay | Admitting: Nurse Practitioner

## 2024-05-31 ENCOUNTER — Ambulatory Visit: Admitting: Nurse Practitioner

## 2024-05-31 NOTE — Assessment & Plan Note (Signed)
 Patient currently maintained on Wegovy  2.4 mg once weekly.  Patient has not been doing as well on diet and exercise as of late secondary to life stressors.  Patient has not gained weight.  Will continue Wegovy  2.4 mg once weekly.  Encourage patient to continue work on healthy lifestyle modifications inclusive of diet and exercise.

## 2024-05-31 NOTE — Patient Instructions (Signed)
 Nice to see you today I want to see you in 3 months for a physical and labs Work on fluid intake  and eating 2-3 meals a day with no snacking Also work on increasing you exercise

## 2024-05-31 NOTE — Progress Notes (Signed)
 Established Patient Office Visit  Subjective   Patient ID: Kerry Martinez, female    DOB: 01/05/1986  Age: 38 y.o. MRN: [id number]  Chief Complaint  Patient presents with   Weight Check    HPI  Obesity: Patient is currently maintained on Wegovy  2.4 mg daily. Patient was started on Wegovy  on 05/24/2023 with a max weight of 382 at that office visit.  Patient's highest weight under my care has been 391. 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     Review of Systems  Constitutional:  Negative for chills and fever.  Respiratory:  Negative for shortness of breath.   Cardiovascular:  Negative for chest pain.  Neurological:  Negative for dizziness and headaches.  Psychiatric/Behavioral:  Negative for hallucinations and suicidal ideas.       Objective:     BP 116/82   Pulse 85   Temp 98 F (36.7 C) (Oral)   Ht 5\' 6"  (1.676 m)   Wt (!) 360 lb (163.3 kg)   SpO2 99%   BMI 58.11 kg/m  BP Readings from Last 3 Encounters:  05/31/24 116/82  02/29/24 128/82  01/31/24 124/68   Wt Readings from Last 3 Encounters:  05/31/24 (!) 360 lb (163.3 kg)  02/29/24 (!) 360 lb (163.3 kg)  01/31/24 (!) 356 lb 12.8 oz (161.8 kg)   SpO2 Readings from Last 3 Encounters:  05/31/24 99%  02/29/24 97%  01/31/24 90%      Physical Exam Vitals and nursing note reviewed.  Constitutional:      Appearance: Normal appearance.  HENT:     Right Ear: Tympanic membrane, ear canal and external ear normal.     Left Ear: Tympanic membrane, ear canal and external ear normal.     Mouth/Throat:     Mouth: Mucous membranes are moist.     Pharynx: Oropharynx is clear.  Eyes:      Extraocular Movements: Extraocular movements intact.     Pupils: Pupils are equal, round, and reactive to light.  Cardiovascular:     Rate and Rhythm: Normal rate and regular rhythm.     Pulses: Normal pulses.     Heart sounds: Normal heart sounds.  Pulmonary:     Effort: Pulmonary effort is normal.     Breath sounds: Normal breath sounds.  Abdominal:     General: Bowel sounds are normal. There is no distension.     Palpations: There is no mass.     Tenderness: There is no abdominal tenderness.     Hernia: No hernia is present.  Musculoskeletal:     Right lower leg: No edema.     Left lower leg: No edema.  Lymphadenopathy:     Cervical: No cervical adenopathy.  Skin:    General: Skin is warm.  Neurological:     General: No focal deficit present.     Mental Status: She is alert.     Deep Tendon Reflexes:     Reflex Scores:      Bicep reflexes are 2+ on the right side and 2+ on the left side.  Patellar reflexes are 2+ on the right side and 2+ on the left side.    Comments: Bilateral upper and lower extremity strength 5/5  Psychiatric:        Mood and Affect: Mood normal.        Behavior: Behavior normal.        Thought Content: Thought content normal.        Judgment: Judgment normal.      No results found for any visits on 05/31/24.    The ASCVD Risk score (Arnett DK, et al., 2019) failed to calculate for the following reasons:   The 2019 ASCVD risk score is only valid for ages 11 to 57    Assessment & Plan:   Problem List Items Addressed This Visit       Other   Morbid obesity (HCC) - Primary   Patient currently maintained on Wegovy  2.4 mg once weekly.  Patient has not been doing as well on diet and exercise as of late secondary to life stressors.  Patient has not gained weight.  Will continue Wegovy  2.4 mg once weekly.  Encourage patient to continue work on healthy lifestyle modifications inclusive of diet and exercise.       Return in about 3 months  (around 08/31/2024) for CPE and Labs.    Margarie Shay, NP

## 2024-06-11 DIAGNOSIS — K50919 Crohn's disease, unspecified, with unspecified complications: Secondary | ICD-10-CM | POA: Diagnosis not present

## 2024-06-14 DIAGNOSIS — K50919 Crohn's disease, unspecified, with unspecified complications: Secondary | ICD-10-CM | POA: Diagnosis not present

## 2024-06-15 DIAGNOSIS — F329 Major depressive disorder, single episode, unspecified: Secondary | ICD-10-CM | POA: Diagnosis not present

## 2024-06-22 ENCOUNTER — Other Ambulatory Visit: Payer: Self-pay | Admitting: Nurse Practitioner

## 2024-06-27 ENCOUNTER — Other Ambulatory Visit: Payer: Self-pay | Admitting: Nurse Practitioner

## 2024-07-10 DIAGNOSIS — K5000 Crohn's disease of small intestine without complications: Secondary | ICD-10-CM | POA: Diagnosis not present

## 2024-07-13 DIAGNOSIS — F329 Major depressive disorder, single episode, unspecified: Secondary | ICD-10-CM | POA: Diagnosis not present

## 2024-07-16 DIAGNOSIS — F331 Major depressive disorder, recurrent, moderate: Secondary | ICD-10-CM | POA: Diagnosis not present

## 2024-07-16 DIAGNOSIS — F4312 Post-traumatic stress disorder, chronic: Secondary | ICD-10-CM | POA: Diagnosis not present

## 2024-07-19 ENCOUNTER — Ambulatory Visit: Admitting: Nurse Practitioner

## 2024-07-19 VITALS — BP 118/86 | HR 97 | Temp 97.7°F | Ht 66.0 in | Wt 360.8 lb

## 2024-07-19 DIAGNOSIS — R8290 Unspecified abnormal findings in urine: Secondary | ICD-10-CM | POA: Diagnosis not present

## 2024-07-19 DIAGNOSIS — N3091 Cystitis, unspecified with hematuria: Secondary | ICD-10-CM | POA: Insufficient documentation

## 2024-07-19 DIAGNOSIS — R300 Dysuria: Secondary | ICD-10-CM | POA: Diagnosis not present

## 2024-07-19 LAB — POCT URINALYSIS DIPSTICK
Bilirubin, UA: NEGATIVE
Blood, UA: POSITIVE
Glucose, UA: NEGATIVE
Ketones, UA: NEGATIVE
Nitrite, UA: NEGATIVE
Protein, UA: POSITIVE — AB
Spec Grav, UA: <=1.005 — AB (ref 1.010–1.025)
Urobilinogen, UA: 0.2 U/dL
pH, UA: 5.5 (ref 5.0–8.0)

## 2024-07-19 MED ORDER — NITROFURANTOIN MONOHYD MACRO 100 MG PO CAPS: 100.0000 mg | ORAL_CAPSULE | Freq: Two times a day (BID) | ORAL | 0 refills | Status: DC

## 2024-07-19 NOTE — Assessment & Plan Note (Signed)
 UA in office.  Offered to send in Pyridium patient politely declines

## 2024-07-19 NOTE — Progress Notes (Signed)
 Acute Office Visit  Subjective:     Patient ID: Kerry Martinez, female    DOB: August 05, 1986, 38 y.o.   MRN: [id number]  Chief Complaint  Patient presents with   Urinary Tract Infection    Pt complains of burning while urinating, frequncy and a little blood that started about a week ago. Pt states of lower back pain.     HPI Patient is in today for dysuria history of Migraines, hypertension, asthma, Crohn's disease, small fiber neuropathy, psoriatic arthritis, prediabetes.  Patient is on immune suppressing medications  States that symptoms started approx 1 week a ago. Started with dysuria and then frequency and in small amounts.  States she recently had intercourse and did not urinate   Review of Systems  Constitutional:  Negative for chills and fever.  Respiratory:  Negative for shortness of breath.   Cardiovascular:  Positive for chest pain.  Gastrointestinal:  Negative for abdominal pain, nausea and vomiting.  Genitourinary:  Positive for dysuria, frequency and hematuria.  Musculoskeletal:  Positive for back pain.        Objective:    BP 118/86   Pulse 97   Temp 97.7 F (36.5 C) (Oral)   Ht 5' 6 (1.676 m)   Wt (!) 360 lb 12.8 oz (163.7 kg)   SpO2 99%   BMI 58.23 kg/m  BP Readings from Last 3 Encounters:  07/19/24 118/86  05/31/24 116/82  02/29/24 128/82   Wt Readings from Last 3 Encounters:  07/19/24 (!) 360 lb 12.8 oz (163.7 kg)  05/31/24 (!) 360 lb (163.3 kg)  02/29/24 (!) 360 lb (163.3 kg)   SpO2 Readings from Last 3 Encounters:  07/19/24 99%  05/31/24 99%  02/29/24 97%      Physical Exam Vitals and nursing note reviewed.  Constitutional:      Appearance: Normal appearance.  Cardiovascular:     Rate and Rhythm: Normal rate and regular rhythm.     Heart sounds: Normal heart sounds.  Pulmonary:     Effort: Pulmonary effort is normal.     Breath sounds: Normal breath sounds.  Abdominal:     General: Bowel sounds are normal. There is no  distension.     Palpations: There is no mass.     Tenderness: There is no abdominal tenderness. There is no right CVA tenderness or left CVA tenderness.     Hernia: No hernia is present.  Neurological:     Mental Status: She is alert.     Results for orders placed or performed in visit on 07/19/24  POCT urinalysis dipstick  Result Value Ref Range   Color, UA yellow    Clarity, UA clear    Glucose, UA Negative Negative   Bilirubin, UA neg    Ketones, UA neg    Spec Grav, UA <=1.005 (A) 1.010 - 1.025   Blood, UA pos    pH, UA 5.5 5.0 - 8.0   Protein, UA Positive (A) Negative   Urobilinogen, UA 0.2 0.2 or 1.0 E.U./dL   Nitrite, UA neg    Leukocytes, UA Large (3+) (A) Negative   Appearance     Odor          Assessment & Plan:   Problem List Items Addressed This Visit       Genitourinary   Cystitis with hematuria   UA indicative of urinary tract infection.  Will treat patient with Macrobid  100 mg twice daily for 5 days.  Patient to push fluids.  Pending urine culture      Relevant Medications   nitrofurantoin , macrocrystal-monohydrate, (MACROBID ) 100 MG capsule     Other   Dysuria - Primary   UA in office.  Offered to send in Pyridium patient politely declines      Relevant Orders   POCT urinalysis dipstick (Completed)   Abnormal urinalysis   Pending urine culture      Relevant Orders   Urine Culture    Meds ordered this encounter  Medications   nitrofurantoin , macrocrystal-monohydrate, (MACROBID ) 100 MG capsule    Sig: Take 1 capsule (100 mg total) by mouth 2 (two) times daily.    Dispense:  10 capsule    Refill:  0    Supervising Provider:   RANDEEN HARDY A [[id number]]    Return if symptoms worsen or fail to improve.  Adina Crandall, NP

## 2024-07-19 NOTE — Patient Instructions (Signed)
 Nice to see you today I have sent in the antibiotics to the pharmacy Push fluids Follow up if no improvement

## 2024-07-19 NOTE — Assessment & Plan Note (Signed)
 UA indicative of urinary tract infection.  Will treat patient with Macrobid  100 mg twice daily for 5 days.  Patient to push fluids.  Pending urine culture

## 2024-07-19 NOTE — Assessment & Plan Note (Signed)
Pending urine culture.

## 2024-07-20 DIAGNOSIS — Z6841 Body Mass Index (BMI) 40.0 and over, adult: Secondary | ICD-10-CM | POA: Diagnosis not present

## 2024-07-20 DIAGNOSIS — Z01419 Encounter for gynecological examination (general) (routine) without abnormal findings: Secondary | ICD-10-CM | POA: Diagnosis not present

## 2024-07-21 LAB — URINE CULTURE
MICRO NUMBER:: 16741371
SPECIMEN QUALITY:: ADEQUATE

## 2024-07-23 ENCOUNTER — Ambulatory Visit: Payer: Self-pay | Admitting: Nurse Practitioner

## 2024-07-23 DIAGNOSIS — N3091 Cystitis, unspecified with hematuria: Secondary | ICD-10-CM

## 2024-07-23 MED ORDER — CIPROFLOXACIN HCL 250 MG PO TABS: 250.0000 mg | ORAL_TABLET | Freq: Two times a day (BID) | ORAL | 0 refills | Status: AC

## 2024-07-24 DIAGNOSIS — G629 Polyneuropathy, unspecified: Secondary | ICD-10-CM | POA: Diagnosis not present

## 2024-07-24 DIAGNOSIS — R202 Paresthesia of skin: Secondary | ICD-10-CM | POA: Diagnosis not present

## 2024-07-24 DIAGNOSIS — R200 Anesthesia of skin: Secondary | ICD-10-CM | POA: Diagnosis not present

## 2024-07-27 DIAGNOSIS — F329 Major depressive disorder, single episode, unspecified: Secondary | ICD-10-CM | POA: Diagnosis not present

## 2024-07-28 ENCOUNTER — Other Ambulatory Visit: Payer: Self-pay | Admitting: Nurse Practitioner

## 2024-08-10 DIAGNOSIS — F329 Major depressive disorder, single episode, unspecified: Secondary | ICD-10-CM | POA: Diagnosis not present

## 2024-08-13 DIAGNOSIS — K50919 Crohn's disease, unspecified, with unspecified complications: Secondary | ICD-10-CM | POA: Diagnosis not present

## 2024-08-24 DIAGNOSIS — F329 Major depressive disorder, single episode, unspecified: Secondary | ICD-10-CM | POA: Diagnosis not present

## 2024-08-27 ENCOUNTER — Other Ambulatory Visit: Payer: Self-pay | Admitting: Nurse Practitioner

## 2024-08-27 DIAGNOSIS — R7303 Prediabetes: Secondary | ICD-10-CM

## 2024-08-27 DIAGNOSIS — E538 Deficiency of other specified B group vitamins: Secondary | ICD-10-CM

## 2024-08-27 DIAGNOSIS — E039 Hypothyroidism, unspecified: Secondary | ICD-10-CM

## 2024-08-27 DIAGNOSIS — Z9884 Bariatric surgery status: Secondary | ICD-10-CM

## 2024-08-27 DIAGNOSIS — K5000 Crohn's disease of small intestine without complications: Secondary | ICD-10-CM

## 2024-08-27 DIAGNOSIS — I10 Essential (primary) hypertension: Secondary | ICD-10-CM

## 2024-08-27 DIAGNOSIS — E559 Vitamin D deficiency, unspecified: Secondary | ICD-10-CM

## 2024-08-28 ENCOUNTER — Other Ambulatory Visit (INDEPENDENT_AMBULATORY_CARE_PROVIDER_SITE_OTHER)

## 2024-08-28 DIAGNOSIS — K5000 Crohn's disease of small intestine without complications: Secondary | ICD-10-CM

## 2024-08-28 DIAGNOSIS — E039 Hypothyroidism, unspecified: Secondary | ICD-10-CM

## 2024-08-28 DIAGNOSIS — I10 Essential (primary) hypertension: Secondary | ICD-10-CM

## 2024-08-28 DIAGNOSIS — R7303 Prediabetes: Secondary | ICD-10-CM | POA: Diagnosis not present

## 2024-08-28 DIAGNOSIS — E559 Vitamin D deficiency, unspecified: Secondary | ICD-10-CM

## 2024-08-28 DIAGNOSIS — Z9884 Bariatric surgery status: Secondary | ICD-10-CM

## 2024-08-28 DIAGNOSIS — E538 Deficiency of other specified B group vitamins: Secondary | ICD-10-CM | POA: Diagnosis not present

## 2024-08-28 LAB — IBC + FERRITIN
Ferritin: 8.3 ng/mL — ABNORMAL LOW (ref 10.0–291.0)
Iron: 52 ug/dL (ref 42–145)
Saturation Ratios: 10.6 % — ABNORMAL LOW (ref 20.0–50.0)
TIBC: 491.4 ug/dL — ABNORMAL HIGH (ref 250.0–450.0)
Transferrin: 351 mg/dL (ref 212.0–360.0)

## 2024-08-28 LAB — CBC WITH DIFFERENTIAL/PLATELET
Basophils Absolute: 0.1 K/uL (ref 0.0–0.1)
Basophils Relative: 1.1 % (ref 0.0–3.0)
Eosinophils Absolute: 0.1 K/uL (ref 0.0–0.7)
Eosinophils Relative: 1.7 % (ref 0.0–5.0)
HCT: 39.4 % (ref 36.0–46.0)
Hemoglobin: 13.1 g/dL (ref 12.0–15.0)
Lymphocytes Relative: 69.3 % — ABNORMAL HIGH (ref 12.0–46.0)
Lymphs Abs: 4.7 K/uL — ABNORMAL HIGH (ref 0.7–4.0)
MCHC: 33.1 g/dL (ref 30.0–36.0)
MCV: 85.4 fl (ref 78.0–100.0)
Monocytes Absolute: 0.5 K/uL (ref 0.1–1.0)
Monocytes Relative: 7.5 % (ref 3.0–12.0)
Neutro Abs: 1.4 K/uL (ref 1.4–7.7)
Neutrophils Relative %: 20.4 % — ABNORMAL LOW (ref 43.0–77.0)
Platelets: 379 K/uL (ref 150.0–400.0)
RBC: 4.61 Mil/uL (ref 3.87–5.11)
RDW: 13.6 % (ref 11.5–15.5)
WBC: 6.8 K/uL (ref 4.0–10.5)

## 2024-08-28 LAB — COMPREHENSIVE METABOLIC PANEL WITH GFR
ALT: 12 U/L (ref 0–35)
AST: 12 U/L (ref 0–37)
Albumin: 4 g/dL (ref 3.5–5.2)
Alkaline Phosphatase: 77 U/L (ref 39–117)
BUN: 11 mg/dL (ref 6–23)
CO2: 28 meq/L (ref 19–32)
Calcium: 9.1 mg/dL (ref 8.4–10.5)
Chloride: 102 meq/L (ref 96–112)
Creatinine, Ser: 1.01 mg/dL (ref 0.40–1.20)
GFR: 72.03 mL/min (ref >60.00)
Glucose, Bld: 85 mg/dL (ref 70–99)
Potassium: 4.6 meq/L (ref 3.5–5.1)
Sodium: 140 meq/L (ref 135–145)
Total Bilirubin: 0.4 mg/dL (ref 0.2–1.2)
Total Protein: 7.3 g/dL (ref 6.0–8.3)

## 2024-08-28 LAB — VITAMIN D 25 HYDROXY (VIT D DEFICIENCY, FRACTURES): VITD: 30.1 ng/mL (ref 30.00–100.00)

## 2024-08-28 LAB — LIPID PANEL
Cholesterol: 199 mg/dL (ref 0–200)
HDL: 53.5 mg/dL (ref >39.00)
LDL Cholesterol: 118 mg/dL — ABNORMAL HIGH (ref 0–99)
NonHDL: 145.79
Total CHOL/HDL Ratio: 4
Triglycerides: 137 mg/dL (ref 0.0–149.0)
VLDL: 27.4 mg/dL (ref 0.0–40.0)

## 2024-08-28 LAB — FOLATE: Folate: 7.4 ng/mL (ref >5.9)

## 2024-08-28 LAB — VITAMIN B12: Vitamin B-12: 207 pg/mL — ABNORMAL LOW (ref 211–911)

## 2024-08-28 LAB — HEMOGLOBIN A1C: Hgb A1c MFr Bld: 5.9 % (ref 4.6–6.5)

## 2024-08-28 LAB — TSH: TSH: 1.75 u[IU]/mL (ref 0.35–5.50)

## 2024-08-30 ENCOUNTER — Ambulatory Visit: Payer: Self-pay | Admitting: Nurse Practitioner

## 2024-08-31 ENCOUNTER — Ambulatory Visit: Admitting: Nurse Practitioner

## 2024-08-31 ENCOUNTER — Encounter: Payer: Self-pay | Admitting: Nurse Practitioner

## 2024-08-31 VITALS — BP 122/82 | HR 88 | Temp 97.8°F | Ht 65.5 in | Wt 360.2 lb

## 2024-08-31 DIAGNOSIS — E538 Deficiency of other specified B group vitamins: Secondary | ICD-10-CM

## 2024-08-31 DIAGNOSIS — L4050 Arthropathic psoriasis, unspecified: Secondary | ICD-10-CM

## 2024-08-31 DIAGNOSIS — E559 Vitamin D deficiency, unspecified: Secondary | ICD-10-CM

## 2024-08-31 DIAGNOSIS — G629 Polyneuropathy, unspecified: Secondary | ICD-10-CM | POA: Diagnosis not present

## 2024-08-31 DIAGNOSIS — J4520 Mild intermittent asthma, uncomplicated: Secondary | ICD-10-CM | POA: Diagnosis not present

## 2024-08-31 DIAGNOSIS — I10 Essential (primary) hypertension: Secondary | ICD-10-CM

## 2024-08-31 DIAGNOSIS — Z0000 Encounter for general adult medical examination without abnormal findings: Secondary | ICD-10-CM

## 2024-08-31 DIAGNOSIS — K5000 Crohn's disease of small intestine without complications: Secondary | ICD-10-CM | POA: Diagnosis not present

## 2024-08-31 DIAGNOSIS — E039 Hypothyroidism, unspecified: Secondary | ICD-10-CM

## 2024-08-31 DIAGNOSIS — R7303 Prediabetes: Secondary | ICD-10-CM

## 2024-08-31 DIAGNOSIS — M797 Fibromyalgia: Secondary | ICD-10-CM

## 2024-08-31 NOTE — Assessment & Plan Note (Signed)
 Patient currently maintained on levothyroxine  100 mcg daily.  TSH within normal limits continue

## 2024-08-31 NOTE — Assessment & Plan Note (Signed)
 Followed by neurology on gabapentin  600 mg.  Continue medication continue following specialist recommended

## 2024-08-31 NOTE — Assessment & Plan Note (Signed)
 History of same patient currently on methotrexate

## 2024-08-31 NOTE — Patient Instructions (Signed)
 Nice to see you today Get your flu shot this season  Follow up with me in 3 months sooner if you need

## 2024-08-31 NOTE — Assessment & Plan Note (Signed)
 Status post gastric surgery.  Currently maintained on vitamin D  50,000 IUs weekly vitamin D  within normal limits continue

## 2024-08-31 NOTE — Assessment & Plan Note (Signed)
 Currently maintained on Wegovy  2.4 mg weekly.  Continue continue working healthy lifestyle modifications

## 2024-08-31 NOTE — Assessment & Plan Note (Signed)
 Discussed age-appropriate immunizations and screening status.  Did review patient's personal, surgical, social, family histories.  Patient is up-to-date with all age-appropriate immunizations she would like.  Patient would like to hold off until next month to get flu vaccine.  Patient is up-to-date on CRC screening.  Cervical cancer screening up-to-date.  Patient is too young for bone density scan or mammogram.  Patient is given information at discharge about preventative healthcare maintenance with anticipatory guidance

## 2024-08-31 NOTE — Progress Notes (Signed)
 Established Patient Office Visit  Subjective   Patient ID: Kerry Martinez, female    DOB: December 16, 1986  Age: 38 y.o. MRN: [id number]  Chief Complaint  Patient presents with   Annual Exam    HPI  HTN: Patient currently maintained on amlodipine  5 mg daily  Asthma: Patient currently maintained on albuterol  as needed, Asmanex daily, Singulair  daily.  Patient is followed by allergy  asthma  MDD: Patient is followed by psychiatry currently maintained on prazosin 13 mg daily, doxepin, Abilify.  Patient is also in therapy  Urticaria/allergies: Patient currently maintained on doxepin, hydroxyzine, levocetirizine, Singulair .  Patient is followed by specialist. Pecide 40mg  BID   RA: Currently maintained on methotrexate injection 20 mg weekly followed by rheumatology  Crohn's disease: Currently maintained on Remicade  and followed by GI.  Hypothyroidism: Currently maintained on levothyroxine  112 mcg daily.  Vitamin D  deficiency: Currently maintained on vitamin D  50,000 IUs daily has tried over-the-counter supplementation without good resolve  for complete physical and follow up of chronic conditions.  Immunizations: -Tetanus: Completed in 2012. Up to date at [facility] -Influenza:  defer  -Shingles: Completed Shingrix series -Pneumonia: Completed 2023  Diet: Fair diet.  She is eating 3 meals most days over the past 3 weeks. Water and coffee  Exercise: No regular exercise.  Eye exam: Needs updating .  Wears glasses Dental exam: Completes semi-annually    Colonoscopy: Completed in 12/02/2022 followed by GI for Crohn's Lung Cancer Screening: N/A  Pap smear: Followed by GYN. August. 2024  Mammogram: Too young, currently average risk  DEXA: Too young, currently average risk  Sleep: going to bed around 9 and will get up around 645-7. Does not feel rested. Has had a sleep study that did not show sleep apnea.        Review of Systems  Constitutional:  Negative for chills and fever.   Respiratory:  Negative for shortness of breath.   Cardiovascular:  Negative for chest pain and leg swelling.  Gastrointestinal:  Negative for abdominal pain, blood in stool, constipation, diarrhea, nausea and vomiting.       Bm daily with wegovy  and crohns  Genitourinary:  Negative for dysuria and hematuria.  Neurological:  Positive for tingling. Negative for headaches.  Psychiatric/Behavioral:  Negative for hallucinations and suicidal ideas.       Objective:     BP 122/82   Pulse 88   Temp 97.8 F (36.6 C) (Oral)   Ht 5' 5.5 (1.664 m)   Wt (!) 360 lb 3.2 oz (163.4 kg)   SpO2 98%   BMI 59.03 kg/m  BP Readings from Last 3 Encounters:  08/31/24 122/82  07/19/24 118/86  05/31/24 116/82   Wt Readings from Last 3 Encounters:  08/31/24 (!) 360 lb 3.2 oz (163.4 kg)  07/19/24 (!) 360 lb 12.8 oz (163.7 kg)  05/31/24 (!) 360 lb (163.3 kg)   SpO2 Readings from Last 3 Encounters:  08/31/24 98%  07/19/24 99%  05/31/24 99%      Physical Exam Vitals and nursing note reviewed.  Constitutional:      Appearance: Normal appearance.  HENT:     Right Ear: Tympanic membrane, ear canal and external ear normal.     Left Ear: Tympanic membrane, ear canal and external ear normal.     Mouth/Throat:     Mouth: Mucous membranes are moist.     Pharynx: Oropharynx is clear.  Eyes:     Extraocular Movements: Extraocular movements intact.     Pupils: Pupils  are equal, round, and reactive to light.  Cardiovascular:     Rate and Rhythm: Normal rate and regular rhythm.     Pulses: Normal pulses.     Heart sounds: Normal heart sounds.  Pulmonary:     Effort: Pulmonary effort is normal.     Breath sounds: Normal breath sounds.  Abdominal:     General: Bowel sounds are normal. There is no distension.     Palpations: There is no mass.     Tenderness: There is no abdominal tenderness.     Hernia: No hernia is present.  Musculoskeletal:     Right lower leg: No edema.     Left lower leg: No  edema.  Lymphadenopathy:     Cervical: No cervical adenopathy.  Skin:    General: Skin is warm.  Neurological:     General: No focal deficit present.     Mental Status: She is alert.     Comments: Bilateral upper and lower extremity strength 5/5  Psychiatric:        Mood and Affect: Mood normal.        Behavior: Behavior normal.        Thought Content: Thought content normal.        Judgment: Judgment normal.      No results found for any visits on 08/31/24.    The ASCVD Risk score (Arnett DK, et al., 2019) failed to calculate for the following reasons:   The 2019 ASCVD risk score is only valid for ages 74 to 53    Assessment & Plan:   Problem List Items Addressed This Visit       Cardiovascular and Mediastinum   Primary hypertension   Patient currently maintained on amlodipine  5 mg daily.  Continue medication as prescribed        Respiratory   Asthma   Currently followed by asthma allergy .  On Asmanex, Singulair   and albuterol .  Continue        Digestive   Crohn's disease, small intestine (HCC) (Chronic)   History of same.  Followed by GI.  Currently on Remicade .  Continue medication as prescribed follow-up specialist recommended        Endocrine   Acquired hypothyroidism   Patient currently maintained on levothyroxine  100 mcg daily.  TSH within normal limits continue        Nervous and Auditory   Small fiber neuropathy   Followed by neurology on gabapentin  600 mg.  Continue medication continue following specialist recommended        Musculoskeletal and Integument   Psoriatic arthritis (HCC)   History of same patient currently on methotrexate        Other   Vitamin B12 deficiency   History of same recent B12 low patient forgot to give herself injection.  Continue vitamin B12 1000 mcg IM once a month      Vitamin D  deficiency   Status post gastric surgery.  Currently maintained on vitamin D  50,000 IUs weekly vitamin D  within normal limits  continue      Morbid obesity (HCC)   Currently maintained on Wegovy  2.4 mg weekly.  Continue continue working healthy lifestyle modifications      Fibromyalgia   History of the same      Preventative health care - Primary   Discussed age-appropriate immunizations and screening status.  Did review patient's personal, surgical, social, family histories.  Patient is up-to-date with all age-appropriate immunizations she would like.  Patient would like to  hold off until next month to get flu vaccine.  Patient is up-to-date on CRC screening.  Cervical cancer screening up-to-date.  Patient is too young for bone density scan or mammogram.  Patient is given information at discharge about preventative healthcare maintenance with anticipatory guidance      Prediabetes   History of same currently maintained on Wegovy  2.4 mg weekly.  Continue work on diet and exercise       Return in about 3 months (around 11/30/2024) for wegovy /weight recheck .    Kerry Crandall, NP

## 2024-08-31 NOTE — Assessment & Plan Note (Signed)
History of the same.

## 2024-08-31 NOTE — Assessment & Plan Note (Signed)
 History of same recent B12 low patient forgot to give herself injection.  Continue vitamin B12 1000 mcg IM once a month

## 2024-08-31 NOTE — Assessment & Plan Note (Signed)
 History of same currently maintained on Wegovy  2.4 mg weekly.  Continue work on diet and exercise

## 2024-08-31 NOTE — Assessment & Plan Note (Signed)
 History of same.  Followed by GI.  Currently on Remicade .  Continue medication as prescribed follow-up specialist recommended

## 2024-08-31 NOTE — Assessment & Plan Note (Signed)
 Patient currently maintained on amlodipine  5 mg daily.  Continue medication as prescribed

## 2024-08-31 NOTE — Assessment & Plan Note (Signed)
 Currently followed by asthma allergy .  On Asmanex, Singulair   and albuterol .  Continue

## 2024-09-06 DIAGNOSIS — F329 Major depressive disorder, single episode, unspecified: Secondary | ICD-10-CM | POA: Diagnosis not present

## 2024-09-09 ENCOUNTER — Other Ambulatory Visit: Payer: Self-pay | Admitting: Nurse Practitioner

## 2024-09-09 DIAGNOSIS — E559 Vitamin D deficiency, unspecified: Secondary | ICD-10-CM

## 2024-09-16 ENCOUNTER — Other Ambulatory Visit: Payer: Self-pay | Admitting: Nurse Practitioner

## 2024-09-16 DIAGNOSIS — I10 Essential (primary) hypertension: Secondary | ICD-10-CM

## 2024-10-08 DIAGNOSIS — K50919 Crohn's disease, unspecified, with unspecified complications: Secondary | ICD-10-CM | POA: Diagnosis not present

## 2024-10-10 ENCOUNTER — Ambulatory Visit: Admitting: Nurse Practitioner

## 2024-10-10 VITALS — BP 118/82 | HR 80 | Temp 98.1°F | Ht 65.5 in | Wt 359.2 lb

## 2024-10-10 DIAGNOSIS — N898 Other specified noninflammatory disorders of vagina: Secondary | ICD-10-CM | POA: Diagnosis not present

## 2024-10-10 DIAGNOSIS — N3091 Cystitis, unspecified with hematuria: Secondary | ICD-10-CM | POA: Diagnosis not present

## 2024-10-10 DIAGNOSIS — R300 Dysuria: Secondary | ICD-10-CM

## 2024-10-10 LAB — POCT URINALYSIS DIPSTICK
Bilirubin, UA: NEGATIVE
Blood, UA: POSITIVE
Glucose, UA: NEGATIVE
Ketones, UA: NEGATIVE
Nitrite, UA: NEGATIVE
Protein, UA: POSITIVE — AB
Spec Grav, UA: 1.015 (ref 1.010–1.025)
Urobilinogen, UA: 0.2 U/dL
pH, UA: 6 (ref 5.0–8.0)

## 2024-10-10 MED ORDER — NITROFURANTOIN MONOHYD MACRO 100 MG PO CAPS: 100.0000 mg | ORAL_CAPSULE | Freq: Two times a day (BID) | ORAL | 0 refills | Status: DC

## 2024-10-10 MED ORDER — PRAZOSIN HCL 2 MG PO CAPS: 4.0000 mg | ORAL_CAPSULE | Freq: Every day | ORAL | Status: AC

## 2024-10-10 NOTE — Progress Notes (Signed)
 Established Patient Office Visit  Subjective   Patient ID: [name], female    DOB: 1986-08-04  Age: 38 y.o. MRN: [id number]  Chief Complaint  Patient presents with   Vaginitis    Pt complains of discharge, odor, itching, and pain while urination that started 2 weeks ago.     HPI   Discussed the use of AI scribe software for clinical note transcription with the patient, who gave verbal consent to proceed.  History of Present Illness NONNIE Martinez is a 38 year old female who presents with urinary symptoms and vaginal discharge.  Symptoms began two weeks ago, starting with itching and burning during urination, accompanied by vaginal discharge. There is some odor present, but no urinary frequency, urgency, or blood in the urine.  She has not experienced any nausea, vomiting, fever, chills, abdominal pain, or back pain. She has not tried any over-the-counter treatments like Azo or Monistat for these symptoms.  No inciting event that she is aware of    Review of Systems  Constitutional:  Negative for chills and fever.  Respiratory:  Negative for shortness of breath.   Cardiovascular:  Negative for chest pain.  Gastrointestinal:  Negative for abdominal pain, nausea and vomiting.  Genitourinary:  Positive for dysuria. Negative for frequency and hematuria.      Objective:     BP 118/82   Pulse 80   Temp 98.1 F (36.7 C) (Oral)   Ht 5' 5.5 (1.664 m)   Wt (!) 359 lb 3.2 oz (162.9 kg)   SpO2 99%   BMI 58.86 kg/m  BP Readings from Last 3 Encounters:  10/10/24 118/82  08/31/24 122/82  07/19/24 118/86   Wt Readings from Last 3 Encounters:  10/10/24 (!) 359 lb 3.2 oz (162.9 kg)  08/31/24 (!) 360 lb 3.2 oz (163.4 kg)  07/19/24 (!) 360 lb 12.8 oz (163.7 kg)   SpO2 Readings from Last 3 Encounters:  10/10/24 99%  08/31/24 98%  07/19/24 99%      Physical Exam Vitals and nursing note reviewed.  Constitutional:      Appearance: Normal appearance.   Cardiovascular:     Rate and Rhythm: Normal rate and regular rhythm.     Heart sounds: Normal heart sounds.  Pulmonary:     Effort: Pulmonary effort is normal.     Breath sounds: Normal breath sounds.  Abdominal:     General: Bowel sounds are normal.     Tenderness: There is no abdominal tenderness. There is no right CVA tenderness or left CVA tenderness.  Neurological:     Mental Status: She is alert.      Results for orders placed or performed in visit on 10/10/24  POCT urinalysis dipstick  Result Value Ref Range   Color, UA YELLOW    Clarity, UA cloudy    Glucose, UA Negative Negative   Bilirubin, UA neg    Ketones, UA neg    Spec Grav, UA 1.015 1.010 - 1.025   Blood, UA pos    pH, UA 6.0 5.0 - 8.0   Protein, UA Positive (A) Negative   Urobilinogen, UA 0.2 0.2 or 1.0 E.U./dL   Nitrite, UA neg    Leukocytes, UA Large (3+) (A) Negative   Appearance     Odor        The ASCVD Risk score (Arnett DK, et al., 2019) failed to calculate for the following reasons:   The 2019 ASCVD risk score is only valid for ages  40 to 79    Assessment & Plan:   Problem List Items Addressed This Visit       Genitourinary   Cystitis with hematuria   Relevant Medications   nitrofurantoin , macrocrystal-monohydrate, (MACROBID ) 100 MG capsule   Other Relevant Orders   Urine Culture     Other   Dysuria - Primary   Relevant Orders   POCT urinalysis dipstick (Completed)   Vaginal discharge   Relevant Orders   WET PREP BY MOLECULAR PROBE   Assessment and Plan Assessment & Plan Urinary tract infection Urinalysis showed infection with hematuria and 3+ leukocytes. Nitrofurantoin  previously well-tolerated. - Prescribe Nitrofurantoin  (Macrobid ). - Send urine for culture for antibiotic coverage.  Vaginal discharge, possible co-infection Vaginal discharge and itching suggest possible co-infection. Self-swab for bacterial vaginosis planned. - Provide self-swab for bacterial vaginosis  testing. - If symptoms improve, do not treat for bacterial vaginosis. - If symptoms do not improve, initiate treatment for bacterial vaginosis.   Return if symptoms worsen or fail to improve, for as scheduled .    Adina Crandall, NP

## 2024-10-10 NOTE — Patient Instructions (Signed)
 Nice to see you today I have sent in antibiotics for the urinary tract infection  Follow up as needed or as scheduled

## 2024-10-11 DIAGNOSIS — F329 Major depressive disorder, single episode, unspecified: Secondary | ICD-10-CM | POA: Diagnosis not present

## 2024-10-13 LAB — URINE CULTURE
MICRO NUMBER:: 17102752
SPECIMEN QUALITY:: ADEQUATE

## 2024-10-13 LAB — WET PREP BY MOLECULAR PROBE
Candida species: NOT DETECTED
Gardnerella vaginalis: NOT DETECTED
MICRO NUMBER:: 17102751
SPECIMEN QUALITY:: ADEQUATE
Trichomonas vaginosis: NOT DETECTED

## 2024-10-15 ENCOUNTER — Ambulatory Visit: Payer: Self-pay | Admitting: Nurse Practitioner

## 2024-10-17 DIAGNOSIS — H61893 Other specified disorders of external ear, bilateral: Secondary | ICD-10-CM | POA: Diagnosis not present

## 2024-10-17 DIAGNOSIS — Z9889 Other specified postprocedural states: Secondary | ICD-10-CM | POA: Diagnosis not present

## 2024-10-17 DIAGNOSIS — Z8669 Personal history of other diseases of the nervous system and sense organs: Secondary | ICD-10-CM | POA: Diagnosis not present

## 2024-10-17 DIAGNOSIS — J069 Acute upper respiratory infection, unspecified: Secondary | ICD-10-CM | POA: Diagnosis not present

## 2024-10-23 ENCOUNTER — Other Ambulatory Visit: Payer: Self-pay | Admitting: Nurse Practitioner

## 2024-10-23 DIAGNOSIS — J4520 Mild intermittent asthma, uncomplicated: Secondary | ICD-10-CM

## 2024-10-27 ENCOUNTER — Other Ambulatory Visit: Payer: Self-pay | Admitting: Nurse Practitioner

## 2024-10-27 ENCOUNTER — Encounter: Payer: Self-pay | Admitting: Nurse Practitioner

## 2024-10-29 MED ORDER — WEGOVY 2.4 MG/0.75ML ~~LOC~~ SOAJ: 2.4000 mg | SUBCUTANEOUS | 0 refills | Status: DC

## 2024-10-29 NOTE — Addendum Note (Signed)
 Addended by: WENDEE LYNWOOD HERO on: 10/29/2024 02:07 PM   Modules accepted: Orders

## 2024-11-05 DIAGNOSIS — F331 Major depressive disorder, recurrent, moderate: Secondary | ICD-10-CM | POA: Diagnosis not present

## 2024-11-05 DIAGNOSIS — F329 Major depressive disorder, single episode, unspecified: Secondary | ICD-10-CM | POA: Diagnosis not present

## 2024-11-05 DIAGNOSIS — F4312 Post-traumatic stress disorder, chronic: Secondary | ICD-10-CM | POA: Diagnosis not present

## 2024-11-08 DIAGNOSIS — J4530 Mild persistent asthma, uncomplicated: Secondary | ICD-10-CM | POA: Diagnosis not present

## 2024-11-08 DIAGNOSIS — L501 Idiopathic urticaria: Secondary | ICD-10-CM | POA: Diagnosis not present

## 2024-11-08 DIAGNOSIS — J3089 Other allergic rhinitis: Secondary | ICD-10-CM | POA: Diagnosis not present

## 2024-11-08 DIAGNOSIS — H1045 Other chronic allergic conjunctivitis: Secondary | ICD-10-CM | POA: Diagnosis not present

## 2024-11-12 DIAGNOSIS — M62838 Other muscle spasm: Secondary | ICD-10-CM | POA: Diagnosis not present

## 2024-11-12 DIAGNOSIS — M9903 Segmental and somatic dysfunction of lumbar region: Secondary | ICD-10-CM | POA: Diagnosis not present

## 2024-11-12 DIAGNOSIS — M9904 Segmental and somatic dysfunction of sacral region: Secondary | ICD-10-CM | POA: Diagnosis not present

## 2024-11-12 DIAGNOSIS — M9905 Segmental and somatic dysfunction of pelvic region: Secondary | ICD-10-CM | POA: Diagnosis not present

## 2024-11-15 DIAGNOSIS — M62838 Other muscle spasm: Secondary | ICD-10-CM | POA: Diagnosis not present

## 2024-11-15 DIAGNOSIS — M9903 Segmental and somatic dysfunction of lumbar region: Secondary | ICD-10-CM | POA: Diagnosis not present

## 2024-11-15 DIAGNOSIS — M9905 Segmental and somatic dysfunction of pelvic region: Secondary | ICD-10-CM | POA: Diagnosis not present

## 2024-11-15 DIAGNOSIS — M9904 Segmental and somatic dysfunction of sacral region: Secondary | ICD-10-CM | POA: Diagnosis not present

## 2024-11-19 DIAGNOSIS — M9903 Segmental and somatic dysfunction of lumbar region: Secondary | ICD-10-CM | POA: Diagnosis not present

## 2024-11-19 DIAGNOSIS — M9904 Segmental and somatic dysfunction of sacral region: Secondary | ICD-10-CM | POA: Diagnosis not present

## 2024-11-19 DIAGNOSIS — M9905 Segmental and somatic dysfunction of pelvic region: Secondary | ICD-10-CM | POA: Diagnosis not present

## 2024-11-19 DIAGNOSIS — M62838 Other muscle spasm: Secondary | ICD-10-CM | POA: Diagnosis not present

## 2024-11-20 DIAGNOSIS — L4059 Other psoriatic arthropathy: Secondary | ICD-10-CM | POA: Diagnosis not present

## 2024-11-20 DIAGNOSIS — L400 Psoriasis vulgaris: Secondary | ICD-10-CM | POA: Diagnosis not present

## 2024-11-29 DIAGNOSIS — F329 Major depressive disorder, single episode, unspecified: Secondary | ICD-10-CM | POA: Diagnosis not present

## 2024-11-30 ENCOUNTER — Ambulatory Visit (INDEPENDENT_AMBULATORY_CARE_PROVIDER_SITE_OTHER): Admitting: Nurse Practitioner

## 2024-11-30 VITALS — BP 118/86 | HR 78 | Temp 97.7°F | Ht 65.5 in | Wt 358.2 lb

## 2024-11-30 DIAGNOSIS — I10 Essential (primary) hypertension: Secondary | ICD-10-CM

## 2024-11-30 NOTE — Progress Notes (Signed)
 Established Patient Office Visit  Subjective   Patient ID: Kerry Martinez, female    DOB: 05-May-1986  Age: 38 y.o. MRN: [id number]  Chief Complaint  Patient presents with   Follow-up    Pt complains of doing well on wegovy . States of no major side effects.     HPI  Discussed the use of AI scribe software for clinical note transcription with the patient, who gave verbal consent to proceed.  History of Present Illness Kerry Martinez is a 38 year old female with psoriasis who presents for a follow-up on Wegovy  and psoriasis management.  She is experiencing a significant psoriasis flare and is currently using betamethasone  cream. She has not yet started Tremfya, which was recommended by her dermatologist after methotrexate was not filled. She is no longer on Xolair  and has discontinued methotrexate. She continues with Remicade  infusions.  She reports no issues with Wegovy , including no constipation. Her appetite is reduced, and she tries to eat at least twice a day, occasionally three meals. She snacks on nuts, using prepackaged portions to control intake. She drinks water, coffee, and diet soda, having stopped Dr. Nunzio. Exercise is limited due to weather and time constraints.  No fever, chills, chest pain, or shortness of breath. She is actively involved in caregiving for her parents, which impacts her schedule.     Review of Systems  Constitutional:  Negative for chills and fever.  Respiratory:  Negative for shortness of breath.   Cardiovascular:  Negative for chest pain.  Neurological:  Negative for headaches.  Psychiatric/Behavioral:  Negative for hallucinations and suicidal ideas.       Objective:     BP 118/86   Pulse 78   Temp 97.7 F (36.5 C) (Oral)   Ht 5' 5.5 (1.664 m)   Wt (!) 358 lb 3.2 oz (162.5 kg)   SpO2 98%   BMI 58.70 kg/m  BP Readings from Last 3 Encounters:  11/30/24 118/86  10/10/24 118/82  08/31/24 122/82   Wt Readings from Last 3  Encounters:  11/30/24 (!) 358 lb 3.2 oz (162.5 kg)  10/10/24 (!) 359 lb 3.2 oz (162.9 kg)  08/31/24 (!) 360 lb 3.2 oz (163.4 kg)   SpO2 Readings from Last 3 Encounters:  11/30/24 98%  10/10/24 99%  08/31/24 98%      Physical Exam Vitals and nursing note reviewed.  Constitutional:      Appearance: Normal appearance.  Cardiovascular:     Rate and Rhythm: Normal rate and regular rhythm.     Heart sounds: Normal heart sounds.  Pulmonary:     Effort: Pulmonary effort is normal.     Breath sounds: Normal breath sounds.  Abdominal:     General: Bowel sounds are normal.  Neurological:     Mental Status: She is alert.      No results found for any visits on 11/30/24.    The ASCVD Risk score (Arnett DK, et al., 2019) failed to calculate for the following reasons:   The 2019 ASCVD risk score is only valid for ages 66 to 73    Assessment & Plan:   Problem List Items Addressed This Visit       Cardiovascular and Mediastinum   Primary hypertension - Primary     Other   Morbid obesity (HCC)   Assessment and Plan Assessment & Plan Obesity Management with Wegovy  is ongoing with no significant side effects. Weight is trending down, indicating progress. - Continue Wegovy  as prescribed. -  Encouraged protein-rich meals with vegetables and minimal carbohydrates. - Recommended 30 minutes of exercise daily, five times a week. - Consider Jazzercise on demand at home for exercise.  Psoriasis Experiencing a major flare. Previously on Xolair , discontinued. Methotrexate not filled. Using betamethasone  cream. Tremfya prescribed but not started. - Start Tremfya once approved. - Continue betamethasone  cream for flare management. -follow up with dermatology as recommended    Return in about 3 months (around 02/28/2025) for Weight/wegovy .    Adina Crandall, NP

## 2024-12-03 DIAGNOSIS — K50919 Crohn's disease, unspecified, with unspecified complications: Secondary | ICD-10-CM | POA: Diagnosis not present

## 2024-12-05 DIAGNOSIS — K50919 Crohn's disease, unspecified, with unspecified complications: Secondary | ICD-10-CM | POA: Diagnosis not present

## 2024-12-24 DIAGNOSIS — F4312 Post-traumatic stress disorder, chronic: Secondary | ICD-10-CM | POA: Diagnosis not present

## 2024-12-24 DIAGNOSIS — F331 Major depressive disorder, recurrent, moderate: Secondary | ICD-10-CM | POA: Diagnosis not present

## 2024-12-25 DIAGNOSIS — F329 Major depressive disorder, single episode, unspecified: Secondary | ICD-10-CM | POA: Diagnosis not present

## 2025-01-01 ENCOUNTER — Ambulatory Visit: Admitting: Nurse Practitioner

## 2025-01-01 VITALS — BP 100/80 | HR 42 | Temp 97.6°F | Ht 65.5 in | Wt 346.4 lb

## 2025-01-01 DIAGNOSIS — R000 Tachycardia, unspecified: Secondary | ICD-10-CM | POA: Diagnosis not present

## 2025-01-01 DIAGNOSIS — R7303 Prediabetes: Secondary | ICD-10-CM

## 2025-01-01 DIAGNOSIS — R55 Syncope and collapse: Secondary | ICD-10-CM

## 2025-01-01 DIAGNOSIS — I10 Essential (primary) hypertension: Secondary | ICD-10-CM | POA: Diagnosis not present

## 2025-01-01 LAB — COMPREHENSIVE METABOLIC PANEL WITH GFR
ALT: 22 U/L (ref 3–35)
AST: 21 U/L (ref 5–37)
Albumin: 4.5 g/dL (ref 3.5–5.2)
Alkaline Phosphatase: 79 U/L (ref 39–117)
BUN: 20 mg/dL (ref 6–23)
CO2: 22 meq/L (ref 19–32)
Calcium: 9.1 mg/dL (ref 8.4–10.5)
Chloride: 104 meq/L (ref 96–112)
Creatinine, Ser: 1.08 mg/dL (ref 0.40–1.20)
GFR: 66.3 mL/min
Glucose, Bld: 93 mg/dL (ref 70–99)
Potassium: 4.1 meq/L (ref 3.5–5.1)
Sodium: 137 meq/L (ref 135–145)
Total Bilirubin: 0.6 mg/dL (ref 0.2–1.2)
Total Protein: 8 g/dL (ref 6.0–8.3)

## 2025-01-01 LAB — CBC
HCT: 44.4 % (ref 36.0–46.0)
Hemoglobin: 14.7 g/dL (ref 12.0–15.0)
MCHC: 33.1 g/dL (ref 30.0–36.0)
MCV: 85.1 fl (ref 78.0–100.0)
Platelets: 370 K/uL (ref 150.0–400.0)
RBC: 5.22 Mil/uL — ABNORMAL HIGH (ref 3.87–5.11)
RDW: 13.9 % (ref 11.5–15.5)
WBC: 6.7 K/uL (ref 4.0–10.5)

## 2025-01-01 LAB — TSH: TSH: 2.45 u[IU]/mL (ref 0.35–5.50)

## 2025-01-01 LAB — HEMOGLOBIN A1C: Hgb A1c MFr Bld: 5.3 % (ref 4.6–6.5)

## 2025-01-01 NOTE — Patient Instructions (Signed)
 Nice to see you today. EKG looked okay in office. I will be in touch with blood work once I have reviewed. If you want to cut the amlodipine  in half over the next couple days to see if it makes a difference I am okay with that but you to monitor blood pressure at home. Follow-up if no improvement

## 2025-01-01 NOTE — Progress Notes (Signed)
 "  Established Patient Office Visit  Subjective   Patient ID: Kerry Martinez, female    DOB: 1986-05-19  Age: 39 y.o. MRN: [id number]  Chief Complaint  Patient presents with   Loss of Consciousness    Pt complains of almost passing out at work sunday. Pt complains of feeling worn out. States of feeling tired and unsteady.        With a history of crohn's, small fiber neuropathy, HTN, orthostatic hypotension, inflammatory polyarthropathy, fibromyalgia, anemia, and prediabetes  Discussed the use of AI scribe software for clinical note transcription with the patient, who gave verbal consent to proceed.  History of Present Illness Kerry Martinez is a 39 year old female who presents with episodes of lightheadedness and elevated heart rate.  On Sunday, while standing and singing, she experienced muffled hearing and a sensation of everything feeling distant. She sat down, but later, while reading scripture, she had the same sensation along with difficulty focusing on words. A medic from her congregation noted her pulse was elevated at 118 bpm during these episodes.  She did not experience chest pain, palpitations, or shortness of breath during these episodes. The sensation was described as lightheadedness rather than dizziness, and there was no syncope. Her pulse was elevated at 118 bpm during the episode, and her blood sugar was reportedly 143; she was unsure of the other vital sign results.  Since the episode, she has felt generally unwell with significant fatigue, spending most of the day resting. She feels unsteady when standing but denies severe symptoms. She has experienced increased diarrhea, which has improved to semi-solid stools as of this morning. No fever, chills, or urinary symptoms.  She notes a recent weight loss of 12 pounds over the past month. She is currently on Remicade  and has recently started Tremfya, with her first dose administered a week ago, without significant side  effects.  Her brother and sister-in-law have had similar gastrointestinal symptoms recently, but her mother and others have not been affected. No recent respiratory symptoms, having recovered from a cold over a week ago.        Review of Systems  Constitutional:  Negative for chills and fever.  Respiratory:  Negative for shortness of breath.   Cardiovascular:  Negative for chest pain.  Gastrointestinal:  Positive for diarrhea and nausea (not nausea but felt off). Negative for abdominal pain, blood in stool, melena and vomiting.  Neurological:  Positive for dizziness. Negative for headaches.      Objective:     BP 100/80 (BP Location: Right Arm, Patient Position: Prone, Cuff Size: Large)   Pulse (!) 42   Temp 97.6 F (36.4 C) (Oral)   Ht 5' 5.5 (1.664 m)   Wt (!) 346 lb 6.4 oz (157.1 kg)   SpO2 99%   BMI 56.77 kg/m  BP Readings from Last 3 Encounters:  01/01/25 100/80  11/30/24 118/86  10/10/24 118/82   Wt Readings from Last 3 Encounters:  01/01/25 (!) 346 lb 6.4 oz (157.1 kg)  11/30/24 (!) 358 lb 3.2 oz (162.5 kg)  10/10/24 (!) 359 lb 3.2 oz (162.9 kg)   SpO2 Readings from Last 3 Encounters:  01/01/25 99%  11/30/24 98%  10/10/24 99%      Physical Exam Vitals and nursing note reviewed.  Constitutional:      Appearance: Normal appearance.  HENT:     Right Ear: Tympanic membrane, ear canal and external ear normal.     Left Ear: Tympanic membrane, ear  canal and external ear normal.     Mouth/Throat:     Mouth: Mucous membranes are moist.     Pharynx: Oropharynx is clear.  Eyes:     Extraocular Movements: Extraocular movements intact.     Pupils: Pupils are equal, round, and reactive to light.  Neck:     Vascular: No carotid bruit.  Cardiovascular:     Rate and Rhythm: Regular rhythm. Tachycardia present.     Heart sounds: Normal heart sounds.  Pulmonary:     Effort: Pulmonary effort is normal.     Breath sounds: Normal breath sounds.  Neurological:      General: No focal deficit present.     Mental Status: She is alert.     Motor: Motor function is intact.     Deep Tendon Reflexes:     Reflex Scores:      Bicep reflexes are 2+ on the right side and 2+ on the left side.      Patellar reflexes are 1+ on the right side and 1+ on the left side.    Comments: Bilateral upper and lower extremity strength 5/5      No results found for any visits on 01/01/25.    The ASCVD Risk score (Arnett DK, et al., 2019) failed to calculate for the following reasons:   The 2019 ASCVD risk score is only valid for ages 9 to 48    Assessment & Plan:   Problem List Items Addressed This Visit       Cardiovascular and Mediastinum   Primary hypertension   Relevant Orders   CBC   Comprehensive metabolic panel with GFR   TSH     Other   Prediabetes   Relevant Orders   Hemoglobin A1c   Tachycardia   Relevant Orders   CBC   Comprehensive metabolic panel with GFR   TSH   Other Visit Diagnoses       Pre-syncope    -  Primary   Relevant Orders   Orthostatic vital signs   EKG 12-Lead   CBC   Comprehensive metabolic panel with GFR   TSH   Hemoglobin A1c     Assessment and Plan Assessment & Plan Pre-syncope and orthostatic intolerance Episodes of lightheadedness and muffled hearing with significant pulse drop upon standing suggest orthostatic intolerance. Differential includes dehydration and possible arrhythmia. Recent diarrhea may have contributed to dehydration. Blood sugar normal. - Ordered blood work to assess for underlying causes. - Performed EKG to evaluate heart rhythm.  EKG showed sinus tachycardia with normal PR interval and QTc. - Consider at-home heart monitor if blood work is benign. -  reduce the amlodipine  to 2.5 or skip for the next 2-3 days and monitor BP at home  Acute diarrhea Recent diarrhea episode resolved with semi-solid stools. No blood in stools. Hx of crohn's disease and followed by GI, on  remicade   Psoriasis Started Tremfya for psoriasis management. No ADE per report  Obesity with recent weight loss Unintentional 12-pound weight loss in the past month. - Monitor weight closely.   Return if symptoms worsen or fail to improve, for as scheduled .    Adina Crandall, NP  "

## 2025-01-02 ENCOUNTER — Ambulatory Visit: Payer: Self-pay | Admitting: Nurse Practitioner

## 2025-01-02 ENCOUNTER — Ambulatory Visit: Attending: Nurse Practitioner

## 2025-01-02 DIAGNOSIS — R000 Tachycardia, unspecified: Secondary | ICD-10-CM

## 2025-01-02 DIAGNOSIS — R55 Syncope and collapse: Secondary | ICD-10-CM

## 2025-01-21 ENCOUNTER — Other Ambulatory Visit: Payer: Self-pay | Admitting: Nurse Practitioner

## 2025-01-27 DIAGNOSIS — R000 Tachycardia, unspecified: Secondary | ICD-10-CM | POA: Diagnosis not present

## 2025-01-27 DIAGNOSIS — R55 Syncope and collapse: Secondary | ICD-10-CM | POA: Diagnosis not present

## 2025-01-28 ENCOUNTER — Ambulatory Visit: Payer: Self-pay | Admitting: Nurse Practitioner

## 2025-03-04 ENCOUNTER — Ambulatory Visit: Admitting: Nurse Practitioner

## 2025-09-03 ENCOUNTER — Encounter: Admitting: Nurse Practitioner
